# Patient Record
Sex: Female | Born: 1950 | Race: White | Hispanic: No | Marital: Married | Smoking: Never smoker
Health system: Southern US, Community
[De-identification: ages and names within clinical notes are randomized; demographics above are authoritative.]

## PROBLEM LIST (undated history)

## (undated) DIAGNOSIS — E785 Hyperlipidemia, unspecified: Secondary | ICD-10-CM

## (undated) DIAGNOSIS — C801 Malignant (primary) neoplasm, unspecified: Secondary | ICD-10-CM

## (undated) DIAGNOSIS — T7840XA Allergy, unspecified, initial encounter: Secondary | ICD-10-CM

## (undated) DIAGNOSIS — M1990 Unspecified osteoarthritis, unspecified site: Secondary | ICD-10-CM

## (undated) DIAGNOSIS — H269 Unspecified cataract: Secondary | ICD-10-CM

## (undated) DIAGNOSIS — E119 Type 2 diabetes mellitus without complications: Secondary | ICD-10-CM

## (undated) HISTORY — DX: Unspecified osteoarthritis, unspecified site: M19.90

## (undated) HISTORY — DX: Type 2 diabetes mellitus without complications: E11.9

## (undated) HISTORY — PX: ABDOMINAL HYSTERECTOMY: SHX81

## (undated) HISTORY — DX: Unspecified cataract: H26.9

## (undated) HISTORY — PX: EYE SURGERY: SHX253

## (undated) HISTORY — PX: CATARACT EXTRACTION: SUR2

## (undated) HISTORY — DX: Malignant (primary) neoplasm, unspecified: C80.1

## (undated) HISTORY — DX: Hyperlipidemia, unspecified: E78.5

## (undated) HISTORY — DX: Allergy, unspecified, initial encounter: T78.40XA

## (undated) HISTORY — PX: COSMETIC SURGERY: SHX468

## (undated) HISTORY — PX: TUBAL LIGATION: SHX77

---
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
## 1987-01-31 HISTORY — PX: INCONTINENCE SURGERY: SHX676

## 1987-01-31 HISTORY — PX: CYSTOCELE REPAIR: SHX163

## 1991-01-31 HISTORY — PX: HERNIA REPAIR: SHX51

## 1994-01-30 HISTORY — PX: APPENDECTOMY: SHX54

## 2004-03-05 ENCOUNTER — Ambulatory Visit: Payer: Self-pay | Admitting: Internal Medicine

## 2004-03-07 ENCOUNTER — Ambulatory Visit: Payer: Self-pay | Admitting: Internal Medicine

## 2004-04-08 ENCOUNTER — Ambulatory Visit: Payer: Self-pay | Admitting: Internal Medicine

## 2006-03-08 ENCOUNTER — Ambulatory Visit: Payer: Self-pay | Admitting: Cardiology

## 2006-05-07 DIAGNOSIS — E7800 Pure hypercholesterolemia, unspecified: Secondary | ICD-10-CM | POA: Insufficient documentation

## 2006-05-07 DIAGNOSIS — J301 Allergic rhinitis due to pollen: Secondary | ICD-10-CM | POA: Insufficient documentation

## 2006-05-07 DIAGNOSIS — E1169 Type 2 diabetes mellitus with other specified complication: Secondary | ICD-10-CM | POA: Insufficient documentation

## 2006-09-10 ENCOUNTER — Ambulatory Visit: Payer: Self-pay | Admitting: Gastroenterology

## 2006-09-10 LAB — HM COLONOSCOPY: HM Colonoscopy: 5

## 2007-05-01 HISTORY — PX: BUNIONECTOMY: SHX129

## 2009-01-06 ENCOUNTER — Ambulatory Visit: Payer: Self-pay | Admitting: Unknown Physician Specialty

## 2009-02-08 HISTORY — PX: TOTAL THYROIDECTOMY: SHX2547

## 2009-03-18 DIAGNOSIS — Z85850 Personal history of malignant neoplasm of thyroid: Secondary | ICD-10-CM | POA: Insufficient documentation

## 2013-04-07 DIAGNOSIS — R221 Localized swelling, mass and lump, neck: Secondary | ICD-10-CM | POA: Insufficient documentation

## 2013-04-07 DIAGNOSIS — R490 Dysphonia: Secondary | ICD-10-CM | POA: Insufficient documentation

## 2013-08-12 DIAGNOSIS — I8390 Asymptomatic varicose veins of unspecified lower extremity: Secondary | ICD-10-CM | POA: Insufficient documentation

## 2014-01-19 LAB — HM MAMMOGRAPHY

## 2014-02-23 LAB — CBC AND DIFFERENTIAL
HEMATOCRIT: 43 % (ref 36–46)
Hemoglobin: 14.8 g/dL (ref 12.0–16.0)
NEUTROS ABS: 5 /uL
Platelets: 296 10*3/uL (ref 150–399)
WBC: 7.8 10^3/mL

## 2014-02-23 LAB — TSH: TSH: 0.51 u[IU]/mL (ref <=5.90)

## 2014-02-23 LAB — LIPID PANEL: LDl/HDL Ratio: 2.9

## 2014-03-25 LAB — HEPATIC FUNCTION PANEL
ALK PHOS: 72 U/L (ref 25–125)
ALT: 24 U/L (ref 7–35)
AST: 23 U/L (ref 13–35)
Bilirubin, Total: 0.3 mg/dL

## 2014-05-20 LAB — LIPID PANEL
Cholesterol: 258 mg/dL — AB (ref 0–200)
HDL: 53 mg/dL (ref 35–70)
LDL Cholesterol: 184 mg/dL
Triglycerides: 105 mg/dL (ref 40–160)

## 2014-05-20 LAB — BASIC METABOLIC PANEL
BUN: 16 mg/dL (ref 4–21)
CREATININE: 0.7 mg/dL (ref 0.5–1.1)
Glucose: 107 mg/dL
Potassium: 5.5 mmol/L — AB (ref 3.4–5.3)
Sodium: 139 mmol/L (ref 137–147)

## 2014-05-20 LAB — HEMOGLOBIN A1C: HEMOGLOBIN A1C: 6.2 % — AB (ref 4.0–6.0)

## 2014-05-29 ENCOUNTER — Ambulatory Visit
Admit: 2014-05-29 | Disposition: A | Discharge status: Home / Self Care | Payer: Self-pay | Attending: Orthopedic Surgery | Admitting: Orthopedic Surgery

## 2014-06-11 DIAGNOSIS — R739 Hyperglycemia, unspecified: Secondary | ICD-10-CM | POA: Insufficient documentation

## 2014-06-11 DIAGNOSIS — J321 Chronic frontal sinusitis: Secondary | ICD-10-CM | POA: Insufficient documentation

## 2014-06-11 DIAGNOSIS — E559 Vitamin D deficiency, unspecified: Secondary | ICD-10-CM | POA: Insufficient documentation

## 2014-06-11 DIAGNOSIS — E039 Hypothyroidism, unspecified: Secondary | ICD-10-CM | POA: Insufficient documentation

## 2014-08-06 ENCOUNTER — Ambulatory Visit (INDEPENDENT_AMBULATORY_CARE_PROVIDER_SITE_OTHER): Payer: BC Managed Care – PPO | Admitting: Family Medicine

## 2014-08-06 ENCOUNTER — Encounter: Payer: Self-pay | Admitting: Family Medicine

## 2014-08-06 VITALS — BP 112/80 | HR 64 | Temp 97.8°F | Resp 16 | Ht 66.5 in | Wt 227.0 lb

## 2014-08-06 DIAGNOSIS — R221 Localized swelling, mass and lump, neck: Secondary | ICD-10-CM

## 2014-08-06 DIAGNOSIS — R739 Hyperglycemia, unspecified: Secondary | ICD-10-CM

## 2014-08-06 DIAGNOSIS — E7800 Pure hypercholesterolemia, unspecified: Secondary | ICD-10-CM

## 2014-08-06 DIAGNOSIS — E559 Vitamin D deficiency, unspecified: Secondary | ICD-10-CM

## 2014-08-06 DIAGNOSIS — E039 Hypothyroidism, unspecified: Secondary | ICD-10-CM | POA: Diagnosis not present

## 2014-08-06 DIAGNOSIS — Z0000 Encounter for general adult medical examination without abnormal findings: Secondary | ICD-10-CM | POA: Diagnosis not present

## 2014-08-06 NOTE — Progress Notes (Deleted)
Patient ID: Erica Pineda, female   DOB: [birth year], 64 y.o.   MRN: [id number]        Patient: Erica Pineda, Female    DOB: 28-Mar-1950, 64 y.o.   MRN: [id number] Visit Date: 08/06/2014  Today's Provider: Margarita Rana, MD   Chief Complaint  Patient presents with  . Annual Exam   Subjective:    Annual physical exam Erica Pineda is a 64 y.o. female who presents today for health maintenance and complete physical. She feels {DESC; WELL/FAIRLY WELL/POORLY:18703}. She reports exercising ***. She reports she is sleeping {DESC; WELL/FAIRLY WELL/POORLY:18703}.  -----------------------------------------------------------------   Review of Systems  Social History She  reports that she has never smoked. She does not have any smokeless tobacco history on file. She reports that she drinks alcohol. She reports that she does not use illicit drugs.  Patient Active Problem List   Diagnosis Date Noted  . Chronic frontal sinusitis 06/11/2014  . Blood glucose elevated 06/11/2014  . Adult hypothyroidism 06/11/2014  . Avitaminosis D 06/11/2014  . Cancer of thyroid 03/18/2009  . Hay fever 05/07/2006  . Hypercholesteremia 05/07/2006    Past Surgical History  Procedure Laterality Date  . Total thyroidectomy  02/08/2009  . Appendectomy  1996  . Bunionectomy  05/2007    and hammer-toe  . Hernia repair Left 1993  . Cystocele repair  1989  . Tonsillectomy and adenoidectomy  1972  . Hymenectomy  1971  . Abdominal hysterectomy      Family History Her family history includes Alzheimer's disease in her other; Breast cancer in her maternal aunt and sister; Diabetes in her other; Heart disease in her other; Hyperlipidemia in her other; Hypertension in her other.    Previous Medications   CETIRIZINE HCL 10 MG CAPS    Take by mouth.   FLUTICASONE (VERAMYST) 27.5 MCG/SPRAY NASAL SPRAY    Place into the nose.   LEVOTHYROXINE (SYNTHROID, LEVOTHROID) 150 MCG TABLET    Take by mouth.   MULTIPLE VITAMINS-MINERALS PO     Take by mouth.   OMEGA-3 FATTY ACIDS PO    Take by mouth.    Patient Care Team: Margarita Rana, MD as PCP - General (Family Medicine)     Objective:   Vitals: There were no vitals taken for this visit.   Physical Exam   Depression Screen No flowsheet data found.    Assessment & Plan:     Routine Health Maintenance and Physical Exam  Exercise Activities and Dietary recommendations Goals    None      Immunization History  Administered Date(s) Administered  . Tdap 03/31/2010    Health Maintenance  Topic Date Due  . HIV Screening  02/03/1966  . PAP SMEAR  02/03/1969  . ZOSTAVAX  02/04/2011  . INFLUENZA VACCINE  08/31/2014  . MAMMOGRAM  01/20/2016  . COLONOSCOPY  09/09/2016  . TETANUS/TDAP  03/30/2020      Discussed health benefits of physical activity, and encouraged her to engage in regular exercise appropriate for her age and condition.    --------------------------------------------------------------------

## 2014-08-06 NOTE — Progress Notes (Deleted)
Patient ID: Erica Pineda, female   DOB: 1950-02-05, 64 y.o.   MRN: [id number]       Patient: Erica Pineda, Female    DOB: 08-27-1950, 64 y.o.   MRN: [id number] Visit Date: 08/06/2014  Today's Provider: Margarita Rana, MD   Chief Complaint  Patient presents with  . Medicare Wellness   Subjective:    Annual wellness visit Erica Pineda is a 64 y.o. female who presents today for her Subsequent Annual Wellness Visit. She feels {DESC; WELL/FAIRLY WELL/POORLY:18703}. She reports exercising ***. She reports she is sleeping {DESC; WELL/FAIRLY WELL/POORLY:18703}.  Last mammogram: 01/19/2014 WNL Last Colonoscopy; 08/12/2010 S/P Hysterectomy -----------------------------------------------------------   Review of Systems  History   Social History  . Marital Status: Married    Spouse Name: N/A  . Number of Children: N/A  . Years of Education: N/A   Occupational History  . Not on file.   Social History Main Topics  . Smoking status: Never Smoker   . Smokeless tobacco: Not on file  . Alcohol Use: Yes     Comment: occasional  . Drug Use: No  . Sexual Activity: Not on file   Other Topics Concern  . Not on file   Social History Narrative  . No narrative on file    Patient Active Problem List   Diagnosis Date Noted  . Chronic frontal sinusitis 06/11/2014  . Blood glucose elevated 06/11/2014  . Adult hypothyroidism 06/11/2014  . Avitaminosis D 06/11/2014  . 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 Dysphonia 04/07/2013  . Lump in neck 04/07/2013  . Cancer of thyroid 03/18/2009  . Hay fever 05/07/2006  . Hypercholesteremia 05/07/2006    Past Surgical History  Procedure Laterality Date  . Total thyroidectomy  02/08/2009  . Appendectomy  1996  . Bunionectomy  05/2007    and hammer-toe  . Hernia repair Left 1993  . Cystocele repair  1989  . Tonsillectomy and adenoidectomy  1972  . Hymenectomy  1971  . Abdominal hysterectomy      Her family history includes Alzheimer's disease in her other;  Breast cancer in her maternal aunt and sister; Diabetes in her other; Heart disease in her other; Hyperlipidemia in her other; Hypertension in her other.    Previous Medications   CETIRIZINE HCL 10 MG CAPS    Take by mouth.   FLUTICASONE (VERAMYST) 27.5 MCG/SPRAY NASAL SPRAY    Place into the nose.   HYDROXYZINE (ATARAX/VISTARIL) 25 MG TABLET       LEVOTHYROXINE (SYNTHROID, LEVOTHROID) 150 MCG TABLET    Take by mouth.   MULTIPLE VITAMINS-MINERALS PO    Take by mouth.   OMEGA-3 FATTY ACIDS PO    Take by mouth.   TRIAMCINOLONE CREAM (KENALOG) 0.1 %       VITAMIN D, ERGOCALCIFEROL, (DRISDOL) 50000 UNITS CAPS CAPSULE        Patient Care Team: Margarita Rana, MD as PCP - General (Family Medicine)     Objective:   Vitals: There were no vitals taken for this visit.  Physical Exam  Activities of Daily Living No flowsheet data found.  Fall Risk Assessment No flowsheet data found.   Depression Screen No flowsheet data found.  Cognitive Testing - 6-CIT  Correct? Score   What year is it? {yes no:22349} {0-4:31231} 0 or 4  What month is it? {yes no:22349} {0-3:21082} 0 or 3  Memorize:    Erica Pineda,  [address]  [place],      What time is it? (  within 1 hour) {yes no:22349} {0-3:21082} 0 or 3  Count backwards from 20 {yes no:22349} {0-4:31231} 0, 2, or 4  Name the months of the year {yes no:22349} {0-4:31231} 0, 2, or 4  Repeat name & address above {yes no:22349} {0-10:5044} 0, 2, 4, 6, 8, or 10       TOTAL SCORE  ***/28   Interpretation:  {normal/abnormal:11317::"Normal"}  Normal (0-7) Abnormal (8-28)       Assessment & Plan:     Annual Wellness Visit  Reviewed patient's Family Medical History Reviewed and updated list of patient's medical providers Assessment of cognitive impairment was done Assessed patient's functional ability Established a written schedule for health screening Erica Pineda Completed and Reviewed  Exercise Activities and  Dietary recommendations Goals    None      Immunization History  Administered Date(s) Administered  . Tdap 03/31/2010    Health Maintenance  Topic Date Due  . HIV Screening  02/03/1966  . PAP SMEAR  02/03/1969  . ZOSTAVAX  02/04/2011  . INFLUENZA VACCINE  08/31/2014  . MAMMOGRAM  01/20/2016  . COLONOSCOPY  09/09/2016  . TETANUS/TDAP  03/30/2020      Discussed health benefits of physical activity, and encouraged her to engage in regular exercise appropriate for her age and condition.    ------------------------------------------------------------------------------------------------------------

## 2014-08-06 NOTE — Progress Notes (Signed)
Patient ID: Erica Pineda, female   DOB: 1950-09-28, 64 y.o.   MRN: [id number]       Patient: Erica Pineda, Female    DOB: 02/15/1950, 64 y.o.   MRN: [id number] Visit Date: 08/07/2014  Today's Provider: Margarita Rana, MD   Chief Complaint  Patient presents with  . Annual Exam   Subjective:    Annual physical exam Erica Pineda is a 64 y.o. female who presents today for health maintenance and complete physical. She feels well. She reports exercising daily. Pt reports she has been walking more, anywhere from 10,000 to 20,000 steps. Pt has lost close to 30 pounds. She reports she is sleeping well.  Last mammogram: 01/19/2014 WNL Last colonoscopy: 08/12/2010 S/P Hysterectomy   Review of Systems  Genitourinary:       Incontinence is present  All other systems reviewed and are negative.   Social History She  reports that she has never smoked. She has never used smokeless tobacco. She reports that she drinks alcohol. She reports that she does not use illicit drugs.  Patient Active Problem List   Diagnosis Date Noted  . Chronic frontal sinusitis 06/11/2014  . Blood glucose elevated 06/11/2014  . Adult hypothyroidism 06/11/2014  . Avitaminosis D 06/11/2014  . 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 Dysphonia 04/07/2013  . Lump in neck 04/07/2013  . Cancer of thyroid 03/18/2009  . Hay fever 05/07/2006  . Hypercholesteremia 05/07/2006    Past Surgical History  Procedure Laterality Date  . Total thyroidectomy  02/08/2009  . Appendectomy  1996  . Bunionectomy  05/2007    and hammer-toe  . Hernia repair Left 1993  . Cystocele repair  1989  . Tonsillectomy and adenoidectomy  1972  . Hymenectomy  1971  . Abdominal hysterectomy      Family History Her family history includes Alzheimer's disease in her father and other; Breast cancer in her maternal aunt and sister; Diabetes in her father, other, sister, and sister; Heart disease in her other; Hyperlipidemia in her other; Hypertension in her other;  Osteoporosis in her mother; Stroke in her mother.    Previous Medications   CETIRIZINE HCL 10 MG CAPS    Take by mouth.   FLUTICASONE (VERAMYST) 27.5 MCG/SPRAY NASAL SPRAY    Place into the nose.   HYDROXYZINE (ATARAX/VISTARIL) 25 MG TABLET       LEVOTHYROXINE (SYNTHROID, LEVOTHROID) 150 MCG TABLET    Take by mouth.   MULTIPLE VITAMINS-MINERALS PO    Take by mouth.   OMEGA-3 FATTY ACIDS PO    Take by mouth.   VITAMIN D, ERGOCALCIFEROL, (DRISDOL) 50000 UNITS CAPS CAPSULE        Patient Care Team: Margarita Rana, MD as PCP - General (Family Medicine)     Objective:   Vitals: BP 112/80 mmHg  Pulse 64  Temp(Src) 97.8 F (36.6 C) (Oral)  Resp 16  Ht 5' 6.5" (1.689 m)  Wt 227 lb (102.967 kg)  BMI 36.09 kg/m2   Physical Exam  Constitutional: She is oriented to person, place, and time. She appears well-developed and well-nourished.  HENT:  Head: Normocephalic and atraumatic.  Right Ear: External ear normal.  Left Ear: External ear normal.  Nose: Nose normal.  Mouth/Throat: Oropharynx is clear and moist.  Eyes: Conjunctivae and EOM are normal. Pupils are equal, round, and reactive to light. Right eye exhibits no discharge. Left eye exhibits no discharge.  Neck: Normal range of motion. Neck supple. No tracheal deviation present. No thyromegaly present.  Palpable  fullness on back of right neck  Cardiovascular: Normal rate, regular rhythm, normal heart sounds and intact distal pulses.  Exam reveals no gallop and no friction rub.   No murmur heard. Pulmonary/Chest: Effort normal and breath sounds normal. No respiratory distress. She has no wheezes. She has no rales. She exhibits no tenderness.  Abdominal: Soft. Bowel sounds are normal. She exhibits no distension and no mass. There is no tenderness. There is no rebound and no guarding.  Genitourinary: No breast swelling, tenderness, discharge or bleeding.  Musculoskeletal: Normal range of motion. She exhibits no edema or tenderness.    Neurological: She is alert and oriented to person, place, and time. She has normal reflexes.  Skin: Skin is warm and dry. No rash noted. No erythema. No pallor.  Psychiatric: She has a normal mood and affect. Her behavior is normal. Judgment and thought content normal.     Depression Screen PHQ 2/9 Scores 08/06/2014  PHQ - 2 Score 0      Assessment & Plan:     Routine Health Maintenance and Physical Exam  Exercise Activities and Dietary recommendations Goals    . Reduce portion size     Continue to eat healthy. Continue Weight [employer].         Immunization History  Administered Date(s) Administered  . Tdap 03/31/2010    Health Maintenance  Topic Date Due  . HIV Screening  02/03/1966  . PAP SMEAR  02/03/1969  . ZOSTAVAX  02/04/2011  . INFLUENZA VACCINE  08/31/2014  . MAMMOGRAM  01/20/2016  . COLONOSCOPY  09/09/2016  . TETANUS/TDAP  03/30/2020      Discussed health benefits of physical activity, and encouraged her to engage in regular exercise appropriate for her age and condition.    --------------------------------------------------------------------   2. Hypothyroidism, unspecified hypothyroidism type F/B endocrinology; pt requesting level be checked. - TSH  3. Blood glucose elevated Pt has a h/o this, but has lost 30 pounds. Will FU pending results. - Hemoglobin A1c  4. Hypercholesteremia Pt has h/o this , FU pending report. - Lipid panel - Comprehensive metabolic panel - CBC with Differential/Platelet  5. Lump in neck Stable. Not worsening.  6. Avitaminosis D F/B endo. - Vit D  25 hydroxy (rtn osteoporosis monitoring)   Patient seen and examined by Jerrell Belfast, MD, and note scribed by Renaldo Fiddler, CMA. I have reviewed the document for accuracy and completeness and I agree with above. Jerrell Belfast, MD    Margarita Rana, MD

## 2014-08-13 ENCOUNTER — Encounter: Payer: Self-pay | Admitting: Family Medicine

## 2014-08-14 ENCOUNTER — Other Ambulatory Visit: Payer: Self-pay

## 2014-08-14 DIAGNOSIS — E039 Hypothyroidism, unspecified: Secondary | ICD-10-CM

## 2014-08-21 LAB — CBC WITH DIFFERENTIAL/PLATELET
BASOS: 1 %
Basophils Absolute: 0 10*3/uL (ref 0.0–0.2)
EOS (ABSOLUTE): 0.2 10*3/uL (ref 0.0–0.4)
Eos: 4 %
HEMATOCRIT: 42.3 % (ref 34.0–46.6)
Hemoglobin: 14.4 g/dL (ref 11.1–15.9)
IMMATURE GRANS (ABS): 0 10*3/uL (ref 0.0–0.1)
IMMATURE GRANULOCYTES: 0 %
Lymphocytes Absolute: 1.7 10*3/uL (ref 0.7–3.1)
Lymphs: 29 %
MCH: 29.3 pg (ref 26.6–33.0)
MCHC: 34 g/dL (ref 31.5–35.7)
MCV: 86 fL (ref 79–97)
MONOS ABS: 0.4 10*3/uL (ref 0.1–0.9)
Monocytes: 7 %
NEUTROS ABS: 3.5 10*3/uL (ref 1.4–7.0)
NEUTROS PCT: 59 %
Platelets: 314 10*3/uL (ref 150–379)
RBC: 4.91 x10E6/uL (ref 3.77–5.28)
RDW: 13.7 % (ref 12.3–15.4)
WBC: 5.9 10*3/uL (ref 3.4–10.8)

## 2014-08-21 LAB — HEMOGLOBIN A1C
ESTIMATED AVERAGE GLUCOSE: 126 mg/dL
HEMOGLOBIN A1C: 6 % — AB (ref 4.8–5.6)

## 2014-08-22 LAB — COMPREHENSIVE METABOLIC PANEL
A/G RATIO: 1.8 (ref 1.1–2.5)
ALT: 19 IU/L (ref 0–32)
AST: 17 IU/L (ref 0–40)
Albumin: 4.1 g/dL (ref 3.6–4.8)
Alkaline Phosphatase: 65 IU/L (ref 39–117)
BUN/Creatinine Ratio: 26 (ref 11–26)
BUN: 20 mg/dL (ref 8–27)
Bilirubin Total: 0.5 mg/dL (ref 0.0–1.2)
CO2: 24 mmol/L (ref 18–29)
Calcium: 9.3 mg/dL (ref 8.7–10.3)
Chloride: 102 mmol/L (ref 97–108)
Creatinine, Ser: 0.77 mg/dL (ref 0.57–1.00)
GFR calc Af Amer: 95 mL/min/{1.73_m2} (ref >59)
GFR calc non Af Amer: 82 mL/min/{1.73_m2} (ref >59)
Globulin, Total: 2.3 g/dL (ref 1.5–4.5)
Glucose: 104 mg/dL — ABNORMAL HIGH (ref 65–99)
POTASSIUM: 5 mmol/L (ref 3.5–5.2)
SODIUM: 141 mmol/L (ref 134–144)
Total Protein: 6.4 g/dL (ref 6.0–8.5)

## 2014-08-22 LAB — LIPID PANEL
CHOL/HDL RATIO: 4.7 ratio — AB (ref 0.0–4.4)
Cholesterol, Total: 280 mg/dL — ABNORMAL HIGH (ref 100–199)
HDL: 60 mg/dL (ref >39)
LDL CALC: 197 mg/dL — AB (ref 0–99)
Triglycerides: 116 mg/dL (ref 0–149)
VLDL CHOLESTEROL CAL: 23 mg/dL (ref 5–40)

## 2014-08-22 LAB — VITAMIN D 25 HYDROXY (VIT D DEFICIENCY, FRACTURES): VIT D 25 HYDROXY: 24.2 ng/mL — AB (ref 30.0–100.0)

## 2014-08-22 LAB — TSH: TSH: 0.367 u[IU]/mL — AB (ref 0.450–4.500)

## 2014-08-25 ENCOUNTER — Telehealth: Payer: Self-pay

## 2014-08-25 ENCOUNTER — Telehealth: Payer: Self-pay | Admitting: Family Medicine

## 2014-08-25 DIAGNOSIS — E039 Hypothyroidism, unspecified: Secondary | ICD-10-CM

## 2014-08-25 MED ORDER — LEVOTHYROXINE SODIUM 137 MCG PO TABS: 137.0000 ug | ORAL_TABLET | Freq: Every day | ORAL | Status: DC

## 2014-08-25 NOTE — Telephone Encounter (Signed)
LM about Lab work

## 2014-08-25 NOTE — Telephone Encounter (Signed)
-----   Message from Margarita Rana, MD sent at 08/24/2014  1:55 PM EDT ----- Thyroid overcorrected.  Recommend decrease Levothyroxine to 137 and recheck in 6 weeks. Cholesterol still too high at 280. Vitamin  D borderline. Please see if taking supplement. Blood sugar improved at 6.0. Continue current lifestyle changes.   Thanks.

## 2014-08-25 NOTE — Telephone Encounter (Signed)
Advised pt of lab results. Sent new rx in. Pt is currently taking Vitamin D 50,000 IU every Saturday. Pt verbally acknowledges understanding. Renaldo Fiddler, CMA

## 2015-01-19 ENCOUNTER — Other Ambulatory Visit: Payer: Self-pay

## 2015-01-19 DIAGNOSIS — E559 Vitamin D deficiency, unspecified: Secondary | ICD-10-CM

## 2015-01-19 MED ORDER — VITAMIN D (ERGOCALCIFEROL) 1.25 MG (50000 UNIT) PO CAPS: 50000.0000 [IU] | ORAL_CAPSULE | ORAL | Status: DC

## 2015-03-13 ENCOUNTER — Other Ambulatory Visit: Payer: Self-pay | Admitting: Family Medicine

## 2015-03-13 DIAGNOSIS — E039 Hypothyroidism, unspecified: Secondary | ICD-10-CM

## 2015-04-02 ENCOUNTER — Encounter: Payer: Self-pay | Admitting: Family Medicine

## 2015-04-14 ENCOUNTER — Other Ambulatory Visit: Payer: Self-pay | Admitting: Family Medicine

## 2015-04-14 DIAGNOSIS — E559 Vitamin D deficiency, unspecified: Secondary | ICD-10-CM

## 2015-06-16 ENCOUNTER — Other Ambulatory Visit: Payer: Self-pay | Admitting: Family Medicine

## 2015-06-16 DIAGNOSIS — E039 Hypothyroidism, unspecified: Secondary | ICD-10-CM

## 2015-06-30 ENCOUNTER — Ambulatory Visit (INDEPENDENT_AMBULATORY_CARE_PROVIDER_SITE_OTHER): Payer: BC Managed Care – PPO | Admitting: Family Medicine

## 2015-06-30 ENCOUNTER — Encounter: Payer: Self-pay | Admitting: Family Medicine

## 2015-06-30 VITALS — BP 134/80 | HR 74 | Temp 99.1°F | Resp 16 | Wt 242.8 lb

## 2015-06-30 DIAGNOSIS — E7800 Pure hypercholesterolemia, unspecified: Secondary | ICD-10-CM | POA: Diagnosis not present

## 2015-06-30 DIAGNOSIS — R221 Localized swelling, mass and lump, neck: Secondary | ICD-10-CM

## 2015-06-30 DIAGNOSIS — E039 Hypothyroidism, unspecified: Secondary | ICD-10-CM | POA: Diagnosis not present

## 2015-06-30 DIAGNOSIS — R739 Hyperglycemia, unspecified: Secondary | ICD-10-CM | POA: Diagnosis not present

## 2015-06-30 DIAGNOSIS — L259 Unspecified contact dermatitis, unspecified cause: Secondary | ICD-10-CM | POA: Insufficient documentation

## 2015-06-30 DIAGNOSIS — E559 Vitamin D deficiency, unspecified: Secondary | ICD-10-CM | POA: Diagnosis not present

## 2015-06-30 DIAGNOSIS — D179 Benign lipomatous neoplasm, unspecified: Secondary | ICD-10-CM | POA: Diagnosis not present

## 2015-06-30 MED ORDER — TRIAMCINOLONE ACETONIDE 0.1 % EX CREA: 1.0000 "application " | TOPICAL_CREAM | Freq: Two times a day (BID) | CUTANEOUS | Status: DC

## 2015-06-30 NOTE — Progress Notes (Signed)
Patient: Erica Pineda Female    DOB: 10/08/1950   65 y.o.   MRN: [id number] Visit Date: 06/30/2015  Today's Provider: Margarita Rana, MD   Chief Complaint  Patient presents with  . Skin Problem   Subjective:    HPI Patient comes in office today to address changes to her skin. Patient reports that in March 2015 she was seen by a surgeon at [facility] with concerns of lump/bump under her skin that appeared on the back of her neck and upper back. Patient reports that on 04/14/13 CAT scan was performed on two sites in question and results had  came back as  benign finding. Patient states in the past 3-4 months she has noticed that areas have grown in size.  Saw her thyroid surgeon at that time.  Are enlarged.   Also complaining of intermittent rash and needs medication refilled.    Also, taking her thyroid medication without any difficulty   Has not had her labs checked. No symptoms of hypothyroid.  Will check labs.      Allergies  Allergen Reactions  . Percodan  [Oxycodone-Aspirin]   . Simvastatin Swelling    Other reaction(s): Muscle Pain   Previous Medications   CETIRIZINE HCL 10 MG CAPS    Take by mouth. Reported on 06/30/2015   FLUTICASONE (VERAMYST) 27.5 MCG/SPRAY NASAL SPRAY    Place into the nose. Reported on 06/30/2015   HYDROXYZINE (ATARAX/VISTARIL) 25 MG TABLET    Reported on 06/30/2015   LEVOTHYROXINE (SYNTHROID, LEVOTHROID) 137 MCG TABLET    TAKE 1 TABLET (137 MCG TOTAL) BY MOUTH DAILY BEFORE BREAKFAST.   MULTIPLE VITAMINS-MINERALS PO    Take by mouth.   OMEGA-3 FATTY ACIDS PO    Take by mouth.   VITAMIN D, ERGOCALCIFEROL, (DRISDOL) 50000 UNITS CAPS CAPSULE    TAKE 1 CAPSULE (50,000 UNITS TOTAL) BY MOUTH EVERY 7 (SEVEN) DAYS.    Review of Systems  HENT: Positive for sore throat (patient describes as scratchy). Negative for congestion, dental problem, drooling, ear discharge, ear pain, facial swelling, hearing loss, mouth sores, nosebleeds, postnasal drip, rhinorrhea,  sinus pressure, sneezing, tinnitus, trouble swallowing and voice change.   Eyes: Negative.   Respiratory: Negative.   Cardiovascular: Negative.   Gastrointestinal: Negative.   Endocrine: Negative.   Genitourinary: Negative.   Musculoskeletal: Positive for neck pain (patient reports that neck has been sore since Saturday ). Negative for myalgias, back pain, joint swelling, arthralgias, gait problem and neck stiffness.  Skin: Negative.   Allergic/Immunologic: Negative.   Neurological: Negative.   Hematological: Negative.   Psychiatric/Behavioral: Negative.     Social History  Substance Use Topics  . Smoking status: Never Smoker   . Smokeless tobacco: Never Used  . Alcohol Use: Yes     Comment: occasional   Objective:   BP 134/80 mmHg  Pulse 74  Temp(Src) 99.1 F (37.3 C) (Oral)  Resp 16  Wt 242 lb 12.8 oz (110.133 kg) Physical Exam  Constitutional: She is oriented to person, place, and time. She appears well-developed and well-nourished.  Cardiovascular: Normal rate and regular rhythm.   Pulmonary/Chest: Effort normal and breath sounds normal.  Neurological: She is alert and oriented to person, place, and time.  Skin: Skin is warm and dry.  Palpable nodule in right neck and left back.   Soft. Non-tender.     Psychiatric: She has a normal mood and affect. Her behavior is normal. Judgment and thought content normal.  Assessment & Plan:     1. Lipoma Suspect lesion are lipoma.     2. Lump in neck Lump has grown. Suspect lipoma but patient does have history of cancer. Will refer for further evaluation and treatment.   - Ambulatory referral to General Surgery  3. Hypothyroidism, unspecified hypothyroidism type Will check labs. Continue medication.   - TSH  4. Hypercholesteremia Will check labs.   - Comprehensive Metabolic Panel (CMET) - CBC with Differential/Platelet - Lipid panel  5. Blood glucose elevated Will check labs.   - Hemoglobin A1c  6.  Avitaminosis D Will check labs.   - VITAMIN D 25 Hydroxy (Vit-D Deficiency, Fractures)  7. Contact dermatitis Stable. Will refill medication.    - triamcinolone cream (KENALOG) 0.1 %; Apply 1 application topically 2 (two) times daily.  Dispense: 80 g; Refill: 1     Patient seen and examined by Dr. Jerrell Belfast, and note scribed by [employer], [place]. I have reviewed the document for accuracy and completeness and I agree with above. Jerrell Belfast, MD   Margarita Rana, MD

## 2015-08-09 ENCOUNTER — Encounter: Payer: Self-pay | Admitting: Physician Assistant

## 2015-08-09 ENCOUNTER — Ambulatory Visit (INDEPENDENT_AMBULATORY_CARE_PROVIDER_SITE_OTHER): Payer: BC Managed Care – PPO | Admitting: Physician Assistant

## 2015-08-09 VITALS — BP 130/80 | HR 76 | Temp 98.0°F | Resp 16 | Ht 67.0 in | Wt 240.4 lb

## 2015-08-09 DIAGNOSIS — M25562 Pain in left knee: Secondary | ICD-10-CM | POA: Diagnosis not present

## 2015-08-09 DIAGNOSIS — E890 Postprocedural hypothyroidism: Secondary | ICD-10-CM

## 2015-08-09 DIAGNOSIS — E559 Vitamin D deficiency, unspecified: Secondary | ICD-10-CM | POA: Diagnosis not present

## 2015-08-09 DIAGNOSIS — Z1239 Encounter for other screening for malignant neoplasm of breast: Secondary | ICD-10-CM | POA: Diagnosis not present

## 2015-08-09 DIAGNOSIS — D179 Benign lipomatous neoplasm, unspecified: Secondary | ICD-10-CM

## 2015-08-09 DIAGNOSIS — Z0000 Encounter for general adult medical examination without abnormal findings: Secondary | ICD-10-CM | POA: Diagnosis not present

## 2015-08-09 DIAGNOSIS — E7800 Pure hypercholesterolemia, unspecified: Secondary | ICD-10-CM

## 2015-08-09 NOTE — Patient Instructions (Signed)

## 2015-08-09 NOTE — Progress Notes (Signed)
Patient: Erica Pineda, Female    DOB: 11-28-1950, 65 y.o.   MRN: [id number] Visit Date: 08/09/2015  Today's Provider: Mar Daring, PA-C   No chief complaint on file.  Subjective:    Annual physical exam Erica Pineda is a 65 y.o. female who presents today for health maintenance and complete physical. She feels fairly well. She reports that her left knee hurts. She has been seen by a chiropractor for this as well as Dr. Noemi Chapel. Also she is having neck stiffness and is not sure if it is coming from her lipoma. She also reports that the Lipomas are bigger in size and have been growing more rapidly this year. She is seeing Dr. Tamala Julian at [facility] next week for evaluation of these and possible removal. She reports exercising she walk again for the first time in months and the the pain in her knee flared up. She reports she is sleeping well.   Mammogram:Benign 01/26/15 -There is no mammographic evidence of malignancy. Recommended repeat in 1 year. Eye Exam: 12/2014 Pap: hysterectomy -----------------------------------------------------------------   Review of Systems  Constitutional: Negative.   HENT: Negative.   Eyes: Positive for itching (seasonal allergies).  Respiratory: Negative.   Cardiovascular: Negative.   Gastrointestinal: Negative.   Endocrine: Negative.   Genitourinary: Negative.   Musculoskeletal: Positive for joint swelling (left knee), arthralgias (left knee) and neck stiffness (since the first week of June). Negative for myalgias, back pain, gait problem and neck pain.  Skin: Negative.   Allergic/Immunologic: Positive for environmental allergies.  Neurological: Negative.   Hematological: Negative.   Psychiatric/Behavioral: Negative.     Social History      She  reports that she has never smoked. She has never used smokeless tobacco. She reports that she drinks alcohol. She reports that she does not use illicit drugs.       Social History   Social History  .  Marital Status: Married    Spouse Name: Annie Main  . Number of Children: 3  . Years of Education: 63   Occupational History  . 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 Smoking status: Never Smoker   . Smokeless tobacco: Never Used  . Alcohol Use: Yes     Comment: occasional  . Drug Use: No  . Sexual Activity: Not on file   Other Topics Concern  . Not on file   Social History Narrative    No past medical history on file.   Patient Active Problem List   Diagnosis Date Noted  . Lipoma 06/30/2015  . Contact dermatitis 06/30/2015  . Chronic frontal sinusitis 06/11/2014  . Blood glucose elevated 06/11/2014  . Adult hypothyroidism 06/11/2014  . Avitaminosis D 06/11/2014  . 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 Dysphonia 04/07/2013  . Lump in neck 04/07/2013  . Cancer of thyroid ([place]) 03/18/2009  . Hay fever 05/07/2006  . Hypercholesteremia 05/07/2006    Past Surgical History  Procedure Laterality Date  . Total thyroidectomy  02/08/2009  . Appendectomy  1996  . Bunionectomy  05/2007    and hammer-toe  . Hernia repair Left 1993  . Cystocele repair  1989  . Tonsillectomy and adenoidectomy  1972  . Hymenectomy  1971  . Abdominal hysterectomy      Family History        Family Status  Relation Status Death Age  . Sister Alive   . Mother Alive   . Father Deceased   .  Brother Alive   . Sister Alive         Her family history includes Alzheimer's disease in her father and other; Breast cancer in her maternal aunt and sister; Diabetes in her father, other, sister, and sister; Heart disease in her other; Hyperlipidemia in her other; Hypertension in her other; Osteoporosis in her mother; Stroke in her mother.    Allergies  Allergen Reactions  . Percodan  [Oxycodone-Aspirin]   . Simvastatin Swelling    Other reaction(s): Muscle Pain    No outpatient prescriptions have been marked as taking for the 08/09/15 encounter (Appointment) with Mar Daring, PA-C.    Patient Care Team: Margarita Rana, MD as PCP - General (Family Medicine)     Objective:   Vitals: There were no vitals taken for this visit.   Physical Exam  Constitutional: She is oriented to person, place, and time. She appears well-developed and well-nourished. No distress.  HENT:  Head: Normocephalic and atraumatic.  Right Ear: External ear normal.  Left Ear: External ear normal.  Nose: Nose normal.  Mouth/Throat: Oropharynx is clear and moist. No oropharyngeal exudate.  Eyes: Conjunctivae and EOM are normal. Pupils are equal, round, and reactive to light. Right eye exhibits no discharge. Left eye exhibits no discharge. No scleral icterus.  Neck: Normal range of motion. Neck supple. No JVD present. No tracheal deviation present. No thyromegaly present.  Cardiovascular: Normal rate, regular rhythm, normal heart sounds and intact distal pulses.  Exam reveals no gallop and no friction rub.   No murmur heard. Pulmonary/Chest: Effort normal and breath sounds normal. No respiratory distress. She has no wheezes. She has no rales. She exhibits no tenderness. Right breast exhibits no inverted nipple, no mass, no nipple discharge, no skin change and no tenderness. Left breast exhibits no inverted nipple, no mass, no nipple discharge, no skin change and no tenderness. Breasts are symmetrical.  Abdominal: Soft. Bowel sounds are normal. She exhibits no distension and no mass. There is no tenderness. There is no rebound and no guarding.  Musculoskeletal: Normal range of motion. She exhibits no edema or tenderness.  Lymphadenopathy:    She has no cervical adenopathy.  Neurological: She is alert and oriented to person, place, and time.  Skin: Skin is warm and dry. No rash noted. She is not diaphoretic.  Psychiatric: She has a normal mood and affect. Her behavior is normal. Judgment and thought content normal.  Vitals reviewed.   Depression Screen PHQ 2/9 Scores 08/06/2014  PHQ  - 2 Score 0    Assessment & Plan:     Routine Health Maintenance and Physical Exam  Exercise Activities and Dietary recommendations Goals    . Reduce portion size     Continue to eat healthy. Continue Weight Watchers.         Immunization History  Administered Date(s) Administered  . Tdap 03/31/2010    Health Maintenance  Topic Date Due  . Hepatitis C Screening  November 11, 1950  . HIV Screening  02/03/1966  . PAP SMEAR  02/04/1972  . ZOSTAVAX  02/04/2011  . INFLUENZA VACCINE  08/31/2015  . COLONOSCOPY  09/09/2016  . MAMMOGRAM  01/25/2017  . TETANUS/TDAP  03/30/2020      Discussed health benefits of physical activity, and encouraged her to engage in regular exercise appropriate for her age and condition.   1. Annual physical exam Normal physical exam today. Will check labs as below and f/u pending lab results. If labs are stable and WNL she  will not need to have these rechecked for one year at her next annual physical exam. She is to call the office in the meantime if she has any acute issue, questions or concerns. - CBC with Differential/Platelet - Comprehensive metabolic panel - Hemoglobin A1c  2. Breast cancer screening Mammogram due in 12/2015. Breast exam today was normal.  3. Lipoma Will see Dr. Tamala Julian next week for further evaluation and consideration of removal. May be causing neck stiffness.  4. Hypothyroidism, unspecified hypothyroidism type Secondary to thyroid carcinoma. Will check labs and f/u pending results. - TSH  5. Hypercholesteremia Will check labs as below and f/u pending results. - Lipid panel  6. Avitaminosis D Will check labs as below and f/u pending results. - Vitamin D (25 hydroxy)  7. Left knee pain Has seen a chiropractor before for neck, back and knee pain. Patient is requesting a referral to Dr. Marry Guan for further evaluation. - Ambulatory referral to Orthopedic Surgery  The entirety of the information documented in the History of  Present Illness, Review of Systems and Physical Exam were personally obtained by me. Portions of this information were initially documented by Lyndel Pleasure, CMA and reviewed by me for thoroughness and accuracy. --------------------------------------------------------------------    Mar Daring, PA-C  [facility]

## 2015-09-03 NOTE — Telephone Encounter (Signed)
error 

## 2015-11-25 DIAGNOSIS — D170 Benign lipomatous neoplasm of skin and subcutaneous tissue of head, face and neck: Secondary | ICD-10-CM | POA: Insufficient documentation

## 2016-02-09 ENCOUNTER — Encounter: Payer: Self-pay | Admitting: Physician Assistant

## 2016-02-09 ENCOUNTER — Ambulatory Visit (INDEPENDENT_AMBULATORY_CARE_PROVIDER_SITE_OTHER): Payer: BC Managed Care – PPO | Admitting: Physician Assistant

## 2016-02-09 DIAGNOSIS — Z1159 Encounter for screening for other viral diseases: Secondary | ICD-10-CM | POA: Diagnosis not present

## 2016-02-09 DIAGNOSIS — R5383 Other fatigue: Secondary | ICD-10-CM

## 2016-02-09 DIAGNOSIS — E7800 Pure hypercholesterolemia, unspecified: Secondary | ICD-10-CM

## 2016-02-09 DIAGNOSIS — R739 Hyperglycemia, unspecified: Secondary | ICD-10-CM

## 2016-02-09 DIAGNOSIS — E039 Hypothyroidism, unspecified: Secondary | ICD-10-CM | POA: Diagnosis not present

## 2016-02-09 DIAGNOSIS — C73 Malignant neoplasm of thyroid gland: Secondary | ICD-10-CM

## 2016-02-09 DIAGNOSIS — Z114 Encounter for screening for human immunodeficiency virus [HIV]: Secondary | ICD-10-CM | POA: Diagnosis not present

## 2016-02-09 DIAGNOSIS — A084 Viral intestinal infection, unspecified: Secondary | ICD-10-CM

## 2016-02-09 DIAGNOSIS — G4762 Sleep related leg cramps: Secondary | ICD-10-CM

## 2016-02-09 DIAGNOSIS — E559 Vitamin D deficiency, unspecified: Secondary | ICD-10-CM

## 2016-02-09 NOTE — Patient Instructions (Signed)
Exercising to Lose Weight Introduction Exercising can help you to lose weight. In order to lose weight through exercise, you need to do vigorous-intensity exercise. You can tell that you are exercising with vigorous intensity if you are breathing very hard and fast and cannot hold a conversation while exercising. Moderate-intensity exercise helps to maintain your current weight. You can tell that you are exercising at a moderate level if you have a higher heart rate and faster breathing, but you are still able to hold a conversation. How often should I exercise? Choose an activity that you enjoy and set realistic goals. Your health care provider can help you to make an activity plan that works for you. Exercise regularly as directed by your health care provider. This may include:  Doing resistance training twice each week, such as:  Push-ups.  Sit-ups.  Lifting weights.  Using resistance bands.  Doing a given intensity of exercise for a given amount of time. Choose from these options:  150 minutes of moderate-intensity exercise every week.  75 minutes of vigorous-intensity exercise every week.  A mix of moderate-intensity and vigorous-intensity exercise every week. Children, pregnant women, people who are out of shape, people who are overweight, and older adults may need to consult a health care provider for individual recommendations. If you have any sort of medical condition, be sure to consult your health care provider before starting a new exercise program. What are some activities that can help me to lose weight?  Walking at a rate of at least 4.5 miles an hour.  Jogging or running at a rate of 5 miles per hour.  Biking at a rate of at least 10 miles per hour.  Lap swimming.  Roller-skating or in-line skating.  Cross-country skiing.  Vigorous competitive sports, such as football, basketball, and soccer.  Jumping rope.  Aerobic dancing. How can I be more active in my  day-to-day activities?  Use the stairs instead of the elevator.  Take a walk during your lunch break.  If you drive, park your car farther away from work or school.  If you take public transportation, get off one stop early and walk the rest of the way.  Make all of your phone calls while standing up and walking around.  Get up, stretch, and walk around every 30 minutes throughout the day. What guidelines should I follow while exercising?  Do not exercise so much that you hurt yourself, feel dizzy, or get very short of breath.  Consult your health care provider prior to starting a new exercise program.  Wear comfortable clothes and shoes with good support.  Drink plenty of water while you exercise to prevent dehydration or heat stroke. Body water is lost during exercise and must be replaced.  Work out until you breathe faster and your heart beats faster. This information is not intended to replace advice given to you by your health care provider. Make sure you discuss any questions you have with your health care provider. Document Released: 02/18/2010 Document Revised: 06/24/2015 Document Reviewed: 06/19/2013  2017 Elsevier  

## 2016-02-09 NOTE — Progress Notes (Signed)
Patient: Erica Pineda Female    DOB: Jun 16, 1950   66 y.o.   MRN: [id number] Visit Date: 02/09/2016  Today's Provider: Mar Daring, PA-C   Chief Complaint  Patient presents with  . Abdominal Pain   Subjective:    HPI  Patient c/o nausea and fatigue since Monday. Patient reports poor appetite and abdominal pain today. Patient reports that her husband had same symptoms this week. She does feel these symptoms are improving.  Patient also concerned about her BP, reports that her BP had been elevated at other doctor's office's. Patient denies chest pain or swelling in legs or ankles. Patient would like to have some labs drawn as she has not had previous labs that had been ordered checked.      Allergies  Allergen Reactions  . Percodan  [Oxycodone-Aspirin]   . Simvastatin Swelling    Other reaction(s): Muscle Pain    Patient Active Problem List   Diagnosis Date Noted  . Lipoma of neck 11/25/2015  . Lipoma 06/30/2015  . Contact dermatitis 06/30/2015  . Chronic frontal sinusitis 06/11/2014  . Blood glucose elevated 06/11/2014  . Adult hypothyroidism 06/11/2014  . Avitaminosis D 06/11/2014  . 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 Dysphonia 04/07/2013  . Lump in neck 04/07/2013  . Cancer of thyroid ([place]) 03/18/2009  . Hay fever 05/07/2006  . Hypercholesteremia 05/07/2006     Current Outpatient Prescriptions:  .  Cetirizine HCl 10 MG CAPS, Take 1 capsule by mouth as needed. Reported on 06/30/2015, Disp: , Rfl:  .  fluticasone (VERAMYST) 27.5 MCG/SPRAY nasal spray, Place 2 sprays into the nose as needed. Reported on 08/09/2015, Disp: , Rfl:  .  levothyroxine (SYNTHROID, LEVOTHROID) 137 MCG tablet, TAKE 1 TABLET (137 MCG TOTAL) BY MOUTH DAILY BEFORE BREAKFAST., Disp: 90 tablet, Rfl: 1 .  MULTIPLE VITAMINS-MINERALS PO, Take by mouth., Disp: , Rfl:  .  naproxen sodium (ANAPROX) 220 MG tablet, Take 440 mg by mouth 2 (two) times daily with a meal., Disp: , Rfl:  .  OMEGA-3 FATTY ACIDS  PO, Take by mouth., Disp: , Rfl:  .  Vitamin D, Ergocalciferol, (DRISDOL) 50000 units CAPS capsule, TAKE 1 CAPSULE (50,000 UNITS TOTAL) BY MOUTH EVERY 7 (SEVEN) DAYS., Disp: 12 capsule, Rfl: 1  Review of Systems  Constitutional: Positive for activity change, appetite change and fatigue.  HENT: Negative.   Respiratory: Negative.   Cardiovascular: Negative.   Gastrointestinal: Positive for abdominal pain and nausea.  Neurological: Negative.   Psychiatric/Behavioral: Negative.     Social History  Substance Use Topics  . Smoking status: Never Smoker  . Smokeless tobacco: Never Used  . Alcohol use Yes     Comment: occasional   Objective:   There were no vitals taken for this visit.  Physical Exam  Constitutional: She is oriented to person, place, and time. She appears well-developed and well-nourished. No distress.  Cardiovascular: Normal rate, regular rhythm and normal heart sounds.  Exam reveals no gallop and no friction rub.   No murmur heard. Pulmonary/Chest: Effort normal and breath sounds normal. No respiratory distress. She has no wheezes. She has no rales.  Abdominal: Soft. Normal appearance and bowel sounds are normal. She exhibits no distension and no mass. There is no hepatosplenomegaly. There is no tenderness. There is no rebound, no guarding and no CVA tenderness.  Musculoskeletal: She exhibits no edema.  Neurological: She is alert and oriented to person, place, and time.  Skin: Skin is warm and dry.  She is not diaphoretic.       Assessment & Plan:     1. Viral gastroenteritis Improving. Continue symptomatic relief and push fluids.   2. Adult hypothyroidism H/O surgical removal for cancer of the thyroid gland. Has been stable previously on levothyroxine 118mcg. Will check labs as below and f/u pending results. - TSH  3. Cancer of thyroid [facility]) See above medical treatment plan. - TSH  4. Hypercholesteremia Will check labs as below and f/u pending results. -  Lipid Profile  5. Blood glucose elevated Will check labs as below and f/u pending results. - Comprehensive Metabolic Panel (CMET)  6. Fatigue, unspecified type Worsening. Possibly secondary to viral GI infection. Will check labs as below and f/u pending results. - CBC w/Diff/Platelet - Magnesium  7. Avitaminosis D H/O this and on high dose supplementation. Will check labs as below and f/u pending results. - CBC w/Diff/Platelet - Vitamin D (25 hydroxy)  8. Nocturnal leg cramps Will check labs as below and f/u pending results. Patient also wanting to start ketogenic diet for short while, thus will check labs to make sure she is able to try this diet.  - Magnesium  9. Need for hepatitis C screening test - Hepatitis C Antibody  10. Screening for HIV without presence of risk factors - HIV antibody (with reflex)       Mar Daring, PA-C  [facility]

## 2016-02-12 LAB — COMPREHENSIVE METABOLIC PANEL
ALT: 25 IU/L (ref 0–32)
AST: 20 IU/L (ref 0–40)
Albumin/Globulin Ratio: 1.9 (ref 1.2–2.2)
Albumin: 4.2 g/dL (ref 3.6–4.8)
Alkaline Phosphatase: 63 IU/L (ref 39–117)
BILIRUBIN TOTAL: 0.4 mg/dL (ref 0.0–1.2)
BUN/Creatinine Ratio: 34 — ABNORMAL HIGH (ref 12–28)
BUN: 21 mg/dL (ref 8–27)
CHLORIDE: 98 mmol/L (ref 96–106)
CO2: 24 mmol/L (ref 18–29)
Calcium: 9.1 mg/dL (ref 8.7–10.3)
Creatinine, Ser: 0.62 mg/dL (ref 0.57–1.00)
GFR calc non Af Amer: 95 mL/min/{1.73_m2} (ref >59)
GFR, EST AFRICAN AMERICAN: 109 mL/min/{1.73_m2} (ref >59)
GLUCOSE: 107 mg/dL — AB (ref 65–99)
Globulin, Total: 2.2 g/dL (ref 1.5–4.5)
Potassium: 4.6 mmol/L (ref 3.5–5.2)
Sodium: 140 mmol/L (ref 134–144)
Total Protein: 6.4 g/dL (ref 6.0–8.5)

## 2016-02-12 LAB — CBC WITH DIFFERENTIAL/PLATELET
BASOS ABS: 0 10*3/uL (ref 0.0–0.2)
Basos: 1 %
EOS (ABSOLUTE): 0.3 10*3/uL (ref 0.0–0.4)
Eos: 5 %
Hematocrit: 41.3 % (ref 34.0–46.6)
Hemoglobin: 14 g/dL (ref 11.1–15.9)
IMMATURE GRANS (ABS): 0 10*3/uL (ref 0.0–0.1)
IMMATURE GRANULOCYTES: 0 %
LYMPHS: 28 %
Lymphocytes Absolute: 1.3 10*3/uL (ref 0.7–3.1)
MCH: 28.7 pg (ref 26.6–33.0)
MCHC: 33.9 g/dL (ref 31.5–35.7)
MCV: 85 fL (ref 79–97)
Monocytes Absolute: 0.4 10*3/uL (ref 0.1–0.9)
Monocytes: 9 %
NEUTROS ABS: 2.7 10*3/uL (ref 1.4–7.0)
NEUTROS PCT: 57 %
PLATELETS: 302 10*3/uL (ref 150–379)
RBC: 4.88 x10E6/uL (ref 3.77–5.28)
RDW: 13.1 % (ref 12.3–15.4)
WBC: 4.7 10*3/uL (ref 3.4–10.8)

## 2016-02-12 LAB — VITAMIN D 25 HYDROXY (VIT D DEFICIENCY, FRACTURES): Vit D, 25-Hydroxy: 24.9 ng/mL — ABNORMAL LOW (ref 30.0–100.0)

## 2016-02-12 LAB — LIPID PANEL
CHOLESTEROL TOTAL: 256 mg/dL — AB (ref 100–199)
Chol/HDL Ratio: 5.6 ratio units — ABNORMAL HIGH (ref 0.0–4.4)
HDL: 46 mg/dL (ref >39)
LDL Calculated: 183 mg/dL — ABNORMAL HIGH (ref 0–99)
TRIGLYCERIDES: 133 mg/dL (ref 0–149)
VLDL Cholesterol Cal: 27 mg/dL (ref 5–40)

## 2016-02-12 LAB — HIV ANTIBODY (ROUTINE TESTING W REFLEX): HIV SCREEN 4TH GENERATION: NONREACTIVE

## 2016-02-12 LAB — TSH: TSH: 3.5 u[IU]/mL (ref 0.450–4.500)

## 2016-02-12 LAB — HEPATITIS C ANTIBODY

## 2016-02-12 LAB — MAGNESIUM: Magnesium: 2.2 mg/dL (ref 1.6–2.3)

## 2016-02-14 ENCOUNTER — Telehealth: Payer: Self-pay

## 2016-02-14 NOTE — Telephone Encounter (Signed)
Patient advised as below. Patient reports she will not start cholesterol medication. Patient reports she will work on diet and exercise.

## 2016-02-14 NOTE — Telephone Encounter (Signed)
-----   Message from Mar Daring, [place] sent at 02/14/2016  1:58 PM EST ----- All labs are within normal limits and stable with exception of cholesterol. Cholesterol levels have increased slightly from last year and ASCVD risk is borderline high at 7.04%, 7.5% is when we start medications. I would recommend considering adding a low dose statin at this time for cardiovascular protection if patient is agreeable. Continue healthy lifestyle modifications as well.  Thanks! -JB

## 2016-03-14 ENCOUNTER — Other Ambulatory Visit: Payer: Self-pay

## 2016-03-14 DIAGNOSIS — E039 Hypothyroidism, unspecified: Secondary | ICD-10-CM

## 2016-03-14 MED ORDER — LEVOTHYROXINE SODIUM 137 MCG PO TABS: ORAL_TABLET | ORAL | 1 refills | Status: DC

## 2016-03-14 NOTE — Telephone Encounter (Signed)
Prescription Refill Request on Levothyroxine 137 MCG Tablet QTY: 90 R:1

## 2016-03-22 ENCOUNTER — Other Ambulatory Visit: Payer: Self-pay

## 2016-03-22 DIAGNOSIS — E559 Vitamin D deficiency, unspecified: Secondary | ICD-10-CM

## 2016-03-22 MED ORDER — VITAMIN D (ERGOCALCIFEROL) 1.25 MG (50000 UNIT) PO CAPS: ORAL_CAPSULE | ORAL | 1 refills | Status: DC

## 2016-03-22 NOTE — Telephone Encounter (Signed)
Last ov 02/09/16 Last filled 04/14/15 Please review. Thank you. sd

## 2016-08-07 DIAGNOSIS — C73 Malignant neoplasm of thyroid gland: Secondary | ICD-10-CM | POA: Insufficient documentation

## 2016-08-10 ENCOUNTER — Ambulatory Visit (INDEPENDENT_AMBULATORY_CARE_PROVIDER_SITE_OTHER): Payer: Medicare Other | Admitting: Physician Assistant

## 2016-08-10 ENCOUNTER — Encounter: Payer: Self-pay | Admitting: Physician Assistant

## 2016-08-10 VITALS — BP 140/90 | HR 85 | Temp 97.9°F | Resp 16 | Ht 67.0 in | Wt 233.0 lb

## 2016-08-10 DIAGNOSIS — Z780 Asymptomatic menopausal state: Secondary | ICD-10-CM | POA: Diagnosis not present

## 2016-08-10 DIAGNOSIS — Z1231 Encounter for screening mammogram for malignant neoplasm of breast: Secondary | ICD-10-CM

## 2016-08-10 DIAGNOSIS — Z803 Family history of malignant neoplasm of breast: Secondary | ICD-10-CM

## 2016-08-10 DIAGNOSIS — R739 Hyperglycemia, unspecified: Secondary | ICD-10-CM

## 2016-08-10 DIAGNOSIS — Z6833 Body mass index (BMI) 33.0-33.9, adult: Secondary | ICD-10-CM | POA: Insufficient documentation

## 2016-08-10 DIAGNOSIS — E7800 Pure hypercholesterolemia, unspecified: Secondary | ICD-10-CM

## 2016-08-10 DIAGNOSIS — E559 Vitamin D deficiency, unspecified: Secondary | ICD-10-CM

## 2016-08-10 DIAGNOSIS — Z1211 Encounter for screening for malignant neoplasm of colon: Secondary | ICD-10-CM

## 2016-08-10 DIAGNOSIS — Z13820 Encounter for screening for osteoporosis: Secondary | ICD-10-CM

## 2016-08-10 DIAGNOSIS — C73 Malignant neoplasm of thyroid gland: Secondary | ICD-10-CM

## 2016-08-10 DIAGNOSIS — Z23 Encounter for immunization: Secondary | ICD-10-CM | POA: Diagnosis not present

## 2016-08-10 DIAGNOSIS — Z6836 Body mass index (BMI) 36.0-36.9, adult: Secondary | ICD-10-CM

## 2016-08-10 DIAGNOSIS — Z0000 Encounter for general adult medical examination without abnormal findings: Secondary | ICD-10-CM

## 2016-08-10 DIAGNOSIS — Z1239 Encounter for other screening for malignant neoplasm of breast: Secondary | ICD-10-CM

## 2016-08-10 DIAGNOSIS — E039 Hypothyroidism, unspecified: Secondary | ICD-10-CM | POA: Diagnosis not present

## 2016-08-10 NOTE — Progress Notes (Signed)
Patient: Erica Pineda, Female    DOB: May 14, 1950, 66 y.o.   MRN: [id number] Visit Date: 08/10/2016  Today's Provider: Mar Daring, PA-C   Chief Complaint  Patient presents with  . Annual Exam   Subjective:    Annual physical exam Erica Pineda is a 66 y.o. female who presents today for health maintenance and complete physical. She feels well. She reports exercising none. She reports she is sleeping well.  Last CPE: 08/09/15 Mammogram:01/26/15 Diagnostic-BI-RADS 2-recheck in 1 year. Colonoscopy:09/10/2006-Polyps ----------------------------------------------------------------- Patient is followed by [facility] for thyroid cancer. She is to follow-up in 12 months from 08/09/16.   Review of Systems  Constitutional: Negative.   HENT: Positive for hearing loss (minimal).   Eyes: Positive for photophobia and itching (allergies).  Respiratory: Negative.   Cardiovascular: Negative.   Gastrointestinal: Negative.   Endocrine: Negative.   Genitourinary: Negative.   Musculoskeletal: Positive for joint swelling.  Skin: Negative.   Allergic/Immunologic: Positive for environmental allergies.  Neurological: Negative.   Hematological: Negative.   Psychiatric/Behavioral: Negative.     Social History      She  reports that she has never smoked. She has never used smokeless tobacco. She reports that she drinks alcohol. She reports that she does not use drugs.       Social History   Social History  . Marital status: Married    Spouse name: Annie Main  . Number of children: 3  . Years of education: 72   Occupational History  . 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 Smoking status: Never Smoker  . Smokeless tobacco: Never Used  . Alcohol use Yes     Comment: occasional  . Drug use: No  . Sexual activity: Not Asked   Other Topics Concern  . None   Social History Narrative  . None    Past Medical History:  Diagnosis Date  . Cancer ([place])      Thyroid-follwed by Endocrine     Patient Active Problem List   Diagnosis Date Noted  . Lipoma of neck 11/25/2015  . Lipoma 06/30/2015  . Contact dermatitis 06/30/2015  . Chronic frontal sinusitis 06/11/2014  . Blood glucose elevated 06/11/2014  . Adult hypothyroidism 06/11/2014  . Avitaminosis D 06/11/2014  . 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 Dysphonia 04/07/2013  . Lump in neck 04/07/2013  . Cancer of thyroid ([place]) 03/18/2009  . Hay fever 05/07/2006  . Hypercholesteremia 05/07/2006    Past Surgical History:  Procedure Laterality Date  . ABDOMINAL HYSTERECTOMY    . APPENDECTOMY  1996  . BUNIONECTOMY  05/2007   and hammer-toe  . CYSTOCELE REPAIR  1989  . HERNIA REPAIR Left 1993  . HYMENECTOMY  1971  . TONSILLECTOMY AND ADENOIDECTOMY  1972  . TOTAL THYROIDECTOMY  02/08/2009    Family History        Family Status  Relation Status  . Sister Alive  . Mother Deceased  . Father Deceased  . Brother Alive  . Sister Alive  . Mat Aunt (Not Specified)  . Other (Not Specified)        Her family history includes Alzheimer's disease in her father and other; Breast cancer in her maternal aunt and sister; Diabetes in her father, other, sister, and sister; Heart disease in her other; Hyperlipidemia in her other; Hypertension in her other; Osteoporosis in her mother; Stroke in her mother.     Allergies  Allergen Reactions  .  Percodan  [Oxycodone-Aspirin]   . Simvastatin Swelling    Other reaction(s): Muscle Pain     Current Outpatient Prescriptions:  .  Cetirizine HCl 10 MG CAPS, Take 1 capsule by mouth as needed. Reported on 06/30/2015, Disp: , Rfl:  .  fluticasone (VERAMYST) 27.5 MCG/SPRAY nasal spray, Place 2 sprays into the nose as needed. Reported on 08/09/2015, Disp: , Rfl:  .  levothyroxine (SYNTHROID, LEVOTHROID) 137 MCG tablet, TAKE 1 TABLET (137 MCG TOTAL) BY MOUTH DAILY BEFORE BREAKFAST., Disp: 90 tablet, Rfl: 1 .  MULTIPLE VITAMINS-MINERALS PO, Take by mouth., Disp: ,  Rfl:  .  naproxen sodium (ANAPROX) 220 MG tablet, Take 440 mg by mouth 2 (two) times daily with a meal., Disp: , Rfl:  .  OMEGA-3 FATTY ACIDS PO, Take by mouth., Disp: , Rfl:  .  Vitamin D, Ergocalciferol, (DRISDOL) 50000 units CAPS capsule, TAKE 1 CAPSULE (50,000 UNITS TOTAL) BY MOUTH EVERY 7 (SEVEN) DAYS., Disp: 12 capsule, Rfl: 1   Patient Care Team: Mar Daring, PA-C as PCP - General (Family Medicine)      Objective:   Vitals: BP 140/90 (BP Location: Left Arm, Patient Position: Sitting, Cuff Size: Normal)   Pulse 85   Temp 97.9 F (36.6 C) (Oral)   Resp 16   Ht 5\' 7"  (1.702 m)   Wt 233 lb (105.7 kg)   BMI 36.49 kg/m    Physical Exam  Constitutional: She is oriented to person, place, and time. She appears well-developed and well-nourished. No distress.  HENT:  Head: Normocephalic and atraumatic.  Right Ear: Hearing, tympanic membrane, external ear and ear canal normal.  Left Ear: Hearing, tympanic membrane, external ear and ear canal normal.  Nose: Nose normal.  Mouth/Throat: Uvula is midline, oropharynx is clear and moist and mucous membranes are normal. No oropharyngeal exudate.  Eyes: Pupils are equal, round, and reactive to light. Conjunctivae and EOM are normal. Right eye exhibits no discharge. Left eye exhibits no discharge. No scleral icterus.  Neck: Normal range of motion. Neck supple. No JVD present. Carotid bruit is not present. No tracheal deviation present. No thyromegaly present.  Cardiovascular: Normal rate, regular rhythm, normal heart sounds and intact distal pulses.  Exam reveals no gallop and no friction rub.   No murmur heard. Pulmonary/Chest: Effort normal and breath sounds normal. No respiratory distress. She has no wheezes. She has no rales. She exhibits no tenderness. Right breast exhibits no inverted nipple, no mass, no nipple discharge, no skin change and no tenderness. Left breast exhibits no inverted nipple, no mass, no nipple discharge, no  skin change and no tenderness. Breasts are symmetrical.  Abdominal: Soft. Bowel sounds are normal. She exhibits no distension and no mass. There is no tenderness. There is no rebound and no guarding.  Musculoskeletal: Normal range of motion. She exhibits no edema or tenderness.  Lymphadenopathy:    She has no cervical adenopathy.  Neurological: She is alert and oriented to person, place, and time.  Skin: Skin is warm and dry. No rash noted. She is not diaphoretic.  Psychiatric: She has a normal mood and affect. Her behavior is normal. Judgment and thought content normal.  Vitals reviewed.    Depression Screen PHQ 2/9 Scores 08/10/2016 02/09/2016 08/06/2014  PHQ - 2 Score 0 0 0      Assessment & Plan:     Routine Health Maintenance and Physical Exam  Exercise Activities and Dietary recommendations Goals    . Reduce portion size  Continue to eat healthy. Continue Weight Watchers.         Immunization History  Administered Date(s) Administered  . Tdap 03/31/2010    Health Maintenance  Topic Date Due  . PAP SMEAR  02/04/1972  . DEXA SCAN  02/04/2016  . PNA vac Low Risk Adult (1 of 2 - PCV13) 02/04/2016  . INFLUENZA VACCINE  08/30/2016  . COLONOSCOPY  09/09/2016  . MAMMOGRAM  01/25/2017  . TETANUS/TDAP  03/30/2020  . Hepatitis C Screening  Completed  . HIV Screening  Completed     Discussed health benefits of physical activity, and encouraged her to engage in regular exercise appropriate for her age and condition.    1. Annual physical exam Normal physical exam today. She is to call the office in the meantime if she has any acute issue, questions or concerns.  2. Breast cancer screening Breast exam today was normal. There is family history of breast cancer in her sister. She does perform regular self breast exams. Mammogram was ordered as below. Mammograms are done at [place].  - MM Digital Diagnostic Bilat; Future  3. Adult hypothyroidism Stable.  Secondary to thyroidectomy for cancer. Followed by [facility].  4. Cancer of thyroid ([place]) S/P thyroidectomy.  5. Hypercholesteremia Labs reviewed in care everywhere from [place], showing an increase in cholesterol. Patient advised ASCVD 10 yr risk is elevated at 8.3%. Patient still refuses cholesterol lowering medication. Was agreeable to add daily low dose 81 mg ASA.   6. Blood glucose elevated HgBA1c was 6.2. Labs reviewed from care everywhere as stated above.  7. Avitaminosis D Stable at 29, followed by [facility].   8. BMI 36.0-36.9,adult Patient is currently doing keto diet.   62. Family history of breast cancer History in sister.   10. Screening for colon cancer Last colonoscopy was 2008. Referral placed for repeat screening as below.  - Ambulatory referral to Gastroenterology  11. Osteoporosis screening Due for screening bone density for osteoporosis. Had baseline done in 2013 at diagnosis of thyroid carcinoma. Results were not available.  - DG Bone Density; Future  12. Postmenopausal estrogen deficiency See above medical treatment plan. - DG Bone Density; Future  13. Need for pneumococcal vaccination Prevnar 13 Vaccine given to patient without complications. Patient sat for 15 minutes after administration and was tolerated well without adverse effects. - Pneumococcal conjugate vaccine 13-valent  14. Need for shingles vaccine Shingrix Vaccine given to patient without complications. Patient sat for 15 minutes after administration and was tolerated well without adverse effects. She is to return in 2 months for second vaccination.  - Varicella-zoster vaccine IM (Shingrix)  --------------------------------------------------------------------    Mar Daring, PA-C  [place]

## 2016-08-10 NOTE — Patient Instructions (Signed)
Health Maintenance for Postmenopausal Women Menopause is a normal process in which your reproductive ability comes to an end. This process happens gradually over a span of months to years, usually between the ages of 22 and 9. Menopause is complete when you have missed 12 consecutive menstrual periods. It is important to talk with your health care provider about some of the most common conditions that affect postmenopausal women, such as heart disease, cancer, and bone loss (osteoporosis). Adopting a healthy lifestyle and getting preventive care can help to promote your health and wellness. Those actions can also lower your chances of developing some of these common conditions. What should I know about menopause? During menopause, you may experience a number of symptoms, such as:  Moderate-to-severe hot flashes.  Night sweats.  Decrease in sex drive.  Mood swings.  Headaches.  Tiredness.  Irritability.  Memory problems.  Insomnia.  Choosing to treat or not to treat menopausal changes is an individual decision that you make with your health care provider. What should I know about hormone replacement therapy and supplements? Hormone therapy products are effective for treating symptoms that are associated with menopause, such as hot flashes and night sweats. Hormone replacement carries certain risks, especially as you become older. If you are thinking about using estrogen or estrogen with progestin treatments, discuss the benefits and risks with your health care provider. What should I know about heart disease and stroke? Heart disease, heart attack, and stroke become more likely as you age. This may be due, in part, to the hormonal changes that your body experiences during menopause. These can affect how your body processes dietary fats, triglycerides, and cholesterol. Heart attack and stroke are both medical emergencies. There are many things that you can do to help prevent heart disease  and stroke:  Have your blood pressure checked at least every 1-2 years. High blood pressure causes heart disease and increases the risk of stroke.  If you are 53-22 years old, ask your health care provider if you should take aspirin to prevent a heart attack or a stroke.  Do not use any tobacco products, including cigarettes, chewing tobacco, or electronic cigarettes. If you need help quitting, ask your health care provider.  It is important to eat a healthy diet and maintain a healthy weight. ? Be sure to include plenty of vegetables, fruits, low-fat dairy products, and lean protein. ? Avoid eating foods that are high in solid fats, added sugars, or salt (sodium).  Get regular exercise. This is one of the most important things that you can do for your health. ? Try to exercise for at least 150 minutes each week. The type of exercise that you do should increase your heart rate and make you sweat. This is known as moderate-intensity exercise. ? Try to do strengthening exercises at least twice each week. Do these in addition to the moderate-intensity exercise.  Know your numbers.Ask your health care provider to check your cholesterol and your blood glucose. Continue to have your blood tested as directed by your health care provider.  What should I know about cancer screening? There are several types of cancer. Take the following steps to reduce your risk and to catch any cancer development as early as possible. Breast Cancer  Practice breast self-awareness. ? This means understanding how your breasts normally appear and feel. ? It also means doing regular breast self-exams. Let your health care provider know about any changes, no matter how small.  If you are 40  or older, have a clinician do a breast exam (clinical breast exam or CBE) every year. Depending on your age, family history, and medical history, it may be recommended that you also have a yearly breast X-ray (mammogram).  If you  have a family history of breast cancer, talk with your health care provider about genetic screening.  If you are at high risk for breast cancer, talk with your health care provider about having an MRI and a mammogram every year.  Breast cancer (BRCA) gene test is recommended for women who have family members with BRCA-related cancers. Results of the assessment will determine the need for genetic counseling and BRCA1 and for BRCA2 testing. BRCA-related cancers include these types: ? Breast. This occurs in males or females. ? Ovarian. ? Tubal. This may also be called fallopian tube cancer. ? Cancer of the abdominal or pelvic lining (peritoneal cancer). ? Prostate. ? Pancreatic.  Cervical, Uterine, and Ovarian Cancer Your health care provider may recommend that you be screened regularly for cancer of the pelvic organs. These include your ovaries, uterus, and vagina. This screening involves a pelvic exam, which includes checking for microscopic changes to the surface of your cervix (Pap test).  For women ages 21-65, health care providers may recommend a pelvic exam and a Pap test every three years. For women ages 79-65, they may recommend the Pap test and pelvic exam, combined with testing for human papilloma virus (HPV), every five years. Some types of HPV increase your risk of cervical cancer. Testing for HPV may also be done on women of any age who have unclear Pap test results.  Other health care providers may not recommend any screening for nonpregnant women who are considered low risk for pelvic cancer and have no symptoms. Ask your health care provider if a screening pelvic exam is right for you.  If you have had past treatment for cervical cancer or a condition that could lead to cancer, you need Pap tests and screening for cancer for at least 20 years after your treatment. If Pap tests have been discontinued for you, your risk factors (such as having a new sexual partner) need to be  reassessed to determine if you should start having screenings again. Some women have medical problems that increase the chance of getting cervical cancer. In these cases, your health care provider may recommend that you have screening and Pap tests more often.  If you have a family history of uterine cancer or ovarian cancer, talk with your health care provider about genetic screening.  If you have vaginal bleeding after reaching menopause, tell your health care provider.  There are currently no reliable tests available to screen for ovarian cancer.  Lung Cancer Lung cancer screening is recommended for adults 69-62 years old who are at high risk for lung cancer because of a history of smoking. A yearly low-dose CT scan of the lungs is recommended if you:  Currently smoke.  Have a history of at least 30 pack-years of smoking and you currently smoke or have quit within the past 15 years. A pack-year is smoking an average of one pack of cigarettes per day for one year.  Yearly screening should:  Continue until it has been 15 years since you quit.  Stop if you develop a health problem that would prevent you from having lung cancer treatment.  Colorectal Cancer  This type of cancer can be detected and can often be prevented.  Routine colorectal cancer screening usually begins at  age 42 and continues through age 45.  If you have risk factors for colon cancer, your health care provider may recommend that you be screened at an earlier age.  If you have a family history of colorectal cancer, talk with your health care provider about genetic screening.  Your health care provider may also recommend using home test kits to check for hidden blood in your stool.  A small camera at the end of a tube can be used to examine your colon directly (sigmoidoscopy or colonoscopy). This is done to check for the earliest forms of colorectal cancer.  Direct examination of the colon should be repeated every  5-10 years until age 71. However, if early forms of precancerous polyps or small growths are found or if you have a family history or genetic risk for colorectal cancer, you may need to be screened more often.  Skin Cancer  Check your skin from head to toe regularly.  Monitor any moles. Be sure to tell your health care provider: ? About any new moles or changes in moles, especially if there is a change in a mole's shape or color. ? If you have a mole that is larger than the size of a pencil eraser.  If any of your family members has a history of skin cancer, especially at a young age, talk with your health care provider about genetic screening.  Always use sunscreen. Apply sunscreen liberally and repeatedly throughout the day.  Whenever you are outside, protect yourself by wearing long sleeves, pants, a wide-brimmed hat, and sunglasses.  What should I know about osteoporosis? Osteoporosis is a condition in which bone destruction happens more quickly than new bone creation. After menopause, you may be at an increased risk for osteoporosis. To help prevent osteoporosis or the bone fractures that can happen because of osteoporosis, the following is recommended:  If you are 46-71 years old, get at least 1,000 mg of calcium and at least 600 mg of vitamin D per day.  If you are older than age 55 but younger than age 65, get at least 1,200 mg of calcium and at least 600 mg of vitamin D per day.  If you are older than age 54, get at least 1,200 mg of calcium and at least 800 mg of vitamin D per day.  Smoking and excessive alcohol intake increase the risk of osteoporosis. Eat foods that are rich in calcium and vitamin D, and do weight-bearing exercises several times each week as directed by your health care provider. What should I know about how menopause affects my mental health? Depression may occur at any age, but it is more common as you become older. Common symptoms of depression  include:  Low or sad mood.  Changes in sleep patterns.  Changes in appetite or eating patterns.  Feeling an overall lack of motivation or enjoyment of activities that you previously enjoyed.  Frequent crying spells.  Talk with your health care provider if you think that you are experiencing depression. What should I know about immunizations? It is important that you get and maintain your immunizations. These include:  Tetanus, diphtheria, and pertussis (Tdap) booster vaccine.  Influenza every year before the flu season begins.  Pneumonia vaccine.  Shingles vaccine.  Your health care provider may also recommend other immunizations. This information is not intended to replace advice given to you by your health care provider. Make sure you discuss any questions you have with your health care provider. Document Released: 03/10/2005  Document Revised: 08/06/2015 Document Reviewed: 10/20/2014 Elsevier Interactive Patient Education  2018 [employer]  

## 2016-08-14 DIAGNOSIS — R0789 Other chest pain: Secondary | ICD-10-CM | POA: Insufficient documentation

## 2016-08-15 LAB — HM MAMMOGRAPHY

## 2016-08-15 LAB — HM DEXA SCAN: HM DEXA SCAN: NORMAL

## 2016-08-17 ENCOUNTER — Encounter: Payer: Self-pay | Admitting: Physician Assistant

## 2016-09-04 ENCOUNTER — Encounter: Payer: Self-pay | Admitting: Physician Assistant

## 2016-10-03 ENCOUNTER — Other Ambulatory Visit: Payer: Self-pay | Admitting: Physician Assistant

## 2016-10-03 DIAGNOSIS — E559 Vitamin D deficiency, unspecified: Secondary | ICD-10-CM

## 2016-10-10 ENCOUNTER — Ambulatory Visit (INDEPENDENT_AMBULATORY_CARE_PROVIDER_SITE_OTHER): Payer: Medicare Other | Admitting: Physician Assistant

## 2016-10-10 ENCOUNTER — Encounter: Payer: Self-pay | Admitting: Physician Assistant

## 2016-10-10 VITALS — BP 110/70 | HR 80 | Temp 98.2°F | Resp 16 | Ht 67.0 in | Wt 216.2 lb

## 2016-10-10 DIAGNOSIS — Z23 Encounter for immunization: Secondary | ICD-10-CM | POA: Diagnosis not present

## 2016-10-10 DIAGNOSIS — Z2821 Immunization not carried out because of patient refusal: Secondary | ICD-10-CM

## 2016-10-10 DIAGNOSIS — Z6833 Body mass index (BMI) 33.0-33.9, adult: Secondary | ICD-10-CM | POA: Diagnosis not present

## 2016-10-10 DIAGNOSIS — E7800 Pure hypercholesterolemia, unspecified: Secondary | ICD-10-CM

## 2016-10-10 DIAGNOSIS — R739 Hyperglycemia, unspecified: Secondary | ICD-10-CM | POA: Diagnosis not present

## 2016-10-10 NOTE — Patient Instructions (Signed)
Fad Diets A fad diet is a diet intended for fast weight loss. Popular fad diets include:  Low- and no-carbohydrate diets.  Liquid formula diets.  Very low-calorie diets.  Special food combination diets, such as the raw food diet.  Fad diets usually do not lead to permanent weight loss. How do I recognize a fad diet? If the information about a way of eating promises results that sound too good to be true, it is probably a fad diet. Fad diets often:  Promote "magic" or "miracle" foods.  Guarantee a quick fix to your weight problems.  List "good foods" and "bad foods."  Have rigid menus and a strict calorie restriction.  Require taking pills, herbs, or powders.  Require you to buy a particular product.  Require you to skip meals or to replace meals with a special drink or food bar.  Require specific food combinations.  Require you to cut out an entire food group, such as carbohydrates or fat.  Require you to eat large quantities of one or more foods.  Do not include a health warning.  Do not require you to increase your physical activity.  Make dramatic claims that are refuted by reputable scientific organizations.  What are the dangers of fad diets? There is no scientific evidence that fad diets work, and some may do more harm than good. Dangers of fad diets include:  Weight gain after stopping the diet. Most fad diets are too impractical to follow for the long term. People eventually stop dieting and go back to their usual eating patterns. This creates a yo-yo effect of weight loss and weight gain that is hard on the body and mind.  Nutrient deficiency. If the diet restricts certain types of food, there is a risk of becoming deficient in certain vitamins and minerals.  Problems associated with inactivity. Many fad diets do not encourage physical activity. Physical activity is key to maintaining long-term weight loss. Being inactive is also a major risk factor for heart  disease, stroke, and diabetes.  How can I lose weight without a fad diet?  A healthy way to lose weight and maintain a healthy weight is to:  Eat fewer calories.  Choose healthy foods, such as vegetables, whole grains, fruits, lean proteins, and healthy fats.  Balance your overall food intake with physical activity.  All foods, in moderation, can be a part of your eating plan while you work to achieve a healthy weight. Making lifestyle changes to your eating and physical activity habits will allow you to work toward lasting change. This information is not intended to replace advice given to you by your health care provider. Make sure you discuss any questions you have with your health care provider. Document Released: 10/31/2013 Document Revised: 08/06/2015 Document Reviewed: 07/01/2013 Elsevier Interactive Patient Education  2018 [employer].

## 2016-10-10 NOTE — Progress Notes (Signed)
Patient: Erica Pineda Female    DOB: 08-Dec-1950   66 y.o.   MRN: [id number] Visit Date: 10/10/2016  Today's Provider: Mar Daring, PA-C   Chief Complaint  Patient presents with  . Immunizations  . Weight Check   Subjective:    HPI Patient here today for 2nd dose of Shingrix.   Patient requesting to have labs done, patient reports she starting doing the KETO diet in July and reports she has lost about 30 pounds.     Allergies  Allergen Reactions  . Percodan  [Oxycodone-Aspirin]   . Simvastatin Swelling    Other reaction(s): Muscle Pain     Current Outpatient Prescriptions:  .  levothyroxine (SYNTHROID, LEVOTHROID) 137 MCG tablet, TAKE 1 TABLET (137 MCG TOTAL) BY MOUTH DAILY BEFORE BREAKFAST., Disp: 90 tablet, Rfl: 1 .  Vitamin D, Ergocalciferol, (DRISDOL) 50000 units CAPS capsule, TAKE 1 CAPSULE (50,000 UNITS TOTAL) BY MOUTH EVERY 7 (SEVEN) DAYS., Disp: 12 capsule, Rfl: 3 .  Cetirizine HCl 10 MG CAPS, Take 1 capsule by mouth as needed. Reported on 06/30/2015, Disp: , Rfl:  .  fluticasone (VERAMYST) 27.5 MCG/SPRAY nasal spray, Place 2 sprays into the nose as needed. Reported on 08/09/2015, Disp: , Rfl:  .  MULTIPLE VITAMINS-MINERALS PO, Take by mouth., Disp: , Rfl:  .  naproxen sodium (ANAPROX) 220 MG tablet, Take 440 mg by mouth 2 (two) times daily with a meal., Disp: , Rfl:  .  OMEGA-3 FATTY ACIDS PO, Take by mouth., Disp: , Rfl:   Review of Systems  Constitutional: Negative.   Respiratory: Negative.   Cardiovascular: Negative.   Gastrointestinal: Negative.   Musculoskeletal: Negative.     Social History  Substance Use Topics  . Smoking status: Never Smoker  . Smokeless tobacco: Never Used  . Alcohol use Yes     Comment: occasional   Objective:   BP 110/70 (BP Location: Left Arm, Patient Position: Sitting, Cuff Size: Large)   Pulse 80   Temp 98.2 F (36.8 C) (Oral)   Resp 16   Ht 5\' 7"  (1.702 m)   Wt 216 lb 3.2 oz (98.1 kg)   SpO2 97%   BMI 33.86  kg/m  Vitals:   10/10/16 0851  BP: 110/70  Pulse: 80  Resp: 16  Temp: 98.2 F (36.8 C)  TempSrc: Oral  SpO2: 97%  Weight: 216 lb 3.2 oz (98.1 kg)  Height: 5\' 7"  (1.702 m)     Physical Exam  Constitutional: She appears well-developed and well-nourished. No distress.  Neck: Normal range of motion. Neck supple. No JVD present. No tracheal deviation present. No thyromegaly present.  Cardiovascular: Normal rate, regular rhythm and normal heart sounds.  Exam reveals no gallop and no friction rub.   No murmur heard. Pulmonary/Chest: Effort normal and breath sounds normal. No respiratory distress. She has no wheezes. She has no rales.  Lymphadenopathy:    She has no cervical adenopathy.  Skin: She is not diaphoretic.  Vitals reviewed.       Assessment & Plan:     1. Hypercholesterolemia Patient has lost 30 pounds and made lifestyle changes and is wishing to recheck and see where her labs are now. Will check labs as below and f/u pending results. - Lipid Profile - Basic Metabolic Panel (BMET)  2. Elevated blood sugar Will check labs as below and f/u pending results. - Basic Metabolic Panel (BMET) - HgB A1c  3. BMI 33.0-33.9,adult Counseled patient on healthy lifestyle modifications  including dieting and exercise.  - Lipid Profile - Basic Metabolic Panel (BMET) - HgB A1c  4. Need for shingles vaccine Shingrix Vaccine given to patient without complications. Patient sat for 15 minutes after administration and was tolerated well without adverse effects. - Varicella-zoster vaccine IM (Shingrix)  5. Influenza vaccination declined Patient declined at this time but is willing to get in Nov when she returns from [place].        Mar Daring, PA-C  [facility]

## 2016-10-11 ENCOUNTER — Ambulatory Visit: Payer: Self-pay | Admitting: Physician Assistant

## 2016-10-11 LAB — BASIC METABOLIC PANEL
BUN: 20 mg/dL (ref 7–25)
CALCIUM: 9.4 mg/dL (ref 8.6–10.4)
CHLORIDE: 104 mmol/L (ref 98–110)
CO2: 25 mmol/L (ref 20–32)
Creat: 0.67 mg/dL (ref 0.50–0.99)
Glucose, Bld: 112 mg/dL — ABNORMAL HIGH (ref 65–99)
Potassium: 4.3 mmol/L (ref 3.5–5.3)
SODIUM: 139 mmol/L (ref 135–146)

## 2016-10-11 LAB — HEMOGLOBIN A1C
EAG (MMOL/L): 6.2 (calc)
HEMOGLOBIN A1C: 5.5 %{Hb} (ref <5.7)
Mean Plasma Glucose: 111 (calc)

## 2016-10-11 LAB — LIPID PANEL
CHOL/HDL RATIO: 5.5 (calc) — AB (ref <5.0)
CHOLESTEROL: 253 mg/dL — AB (ref <200)
HDL: 46 mg/dL — AB (ref >50)
LDL CHOLESTEROL (CALC): 181 mg/dL — AB
Non-HDL Cholesterol (Calc): 207 mg/dL (calc) — ABNORMAL HIGH (ref <130)
TRIGLYCERIDES: 125 mg/dL (ref <150)

## 2016-10-12 ENCOUNTER — Telehealth: Payer: Self-pay | Admitting: Physician Assistant

## 2016-10-12 NOTE — Telephone Encounter (Signed)
Pt is returning call.  [phone]/[id number]

## 2016-10-17 NOTE — Telephone Encounter (Signed)
-----   Message from Mar Daring, PA-C sent at 10/11/2016  1:37 PM EDT ----- Cholesterol still up at 253, LDL at 181. Triglycerides better at 125. A1c much improved 5.5 from 6.2 and 6.0 2 years ago. Kidney function normal.

## 2016-10-17 NOTE — Telephone Encounter (Signed)
LMTCB is regarding her labs results. Documentation on why patient was returning called is under labs results. Please try to get a nurse if I am not available to give results.   Thanks,  -Divante Kotch

## 2016-10-17 NOTE — Telephone Encounter (Signed)
lmtcb

## 2016-10-18 NOTE — Telephone Encounter (Signed)
lmtcb

## 2016-10-20 NOTE — Telephone Encounter (Signed)
Patient advised as below.  

## 2017-01-29 LAB — HM COLONOSCOPY

## 2017-07-05 ENCOUNTER — Ambulatory Visit (INDEPENDENT_AMBULATORY_CARE_PROVIDER_SITE_OTHER): Payer: Medicare Other

## 2017-07-05 VITALS — BP 126/74 | HR 77 | Temp 98.4°F | Ht 67.0 in | Wt 219.6 lb

## 2017-07-05 DIAGNOSIS — Z0000 Encounter for general adult medical examination without abnormal findings: Secondary | ICD-10-CM

## 2017-07-05 NOTE — Patient Instructions (Addendum)
Erica Pineda , Thank you for taking time to come for your Medicare Wellness Visit. I appreciate your ongoing commitment to your health goals. Please review the following plan we discussed and let me know if I can assist you in the future.   Screening recommendations/referrals: Colonoscopy: Up to date Mammogram: Up to date Bone Density: Up to date Recommended yearly ophthalmology/optometry visit for glaucoma screening and checkup Recommended yearly dental visit for hygiene and checkup  Vaccinations: Influenza vaccine: N/A Pneumococcal vaccine: Up to date Tdap vaccine: Up to date Shingles vaccine: Up to date     Advanced directives: Please bring a copy of your POA (Power of [place]) and/or Living Will to your next appointment.   Conditions/risks identified: Obesity- continue current diet plan (Keto diet) to help aid in weight loss.   Next appointment: 08/21/17 @ 10 AM with Fenton Malling.   Preventive Care 66 Years and Older, Female Preventive care refers to lifestyle choices and visits with your health care provider that can promote health and wellness. What does preventive care include?  A yearly physical exam. This is also called an annual well check.  Dental exams once or twice a year.  Routine eye exams. Ask your health care provider how often you should have your eyes checked.  Personal lifestyle choices, including:  Daily care of your teeth and gums.  Regular physical activity.  Eating a healthy diet.  Avoiding tobacco and drug use.  Limiting alcohol use.  Practicing safe sex.  Taking low-dose aspirin every day.  Taking vitamin and mineral supplements as recommended by your health care provider. What happens during an annual well check? The services and screenings done by your health care provider during your annual well check will depend on your age, overall health, lifestyle risk factors, and family history of disease. Counseling  Your health care provider  may ask you questions about your:  Alcohol use.  Tobacco use.  Drug use.  Emotional well-being.  Home and relationship well-being.  Sexual activity.  Eating habits.  History of falls.  Memory and ability to understand (cognition).  Work and work Statistician.  Reproductive health. Screening  You may have the following tests or measurements:  Height, weight, and BMI.  Blood pressure.  Lipid and cholesterol levels. These may be checked every 5 years, or more frequently if you are over 67 years old.  Skin check.  Lung cancer screening. You may have this screening every year starting at age 9 if you have a 30-pack-year history of smoking and currently smoke or have quit within the past 15 years.  Fecal occult blood test (FOBT) of the stool. You may have this test every year starting at age 73.  Flexible sigmoidoscopy or colonoscopy. You may have a sigmoidoscopy every 5 years or a colonoscopy every 10 years starting at age 41.  Hepatitis C blood test.  Hepatitis B blood test.  Sexually transmitted disease (STD) testing.  Diabetes screening. This is done by checking your blood sugar (glucose) after you have not eaten for a while (fasting). You may have this done every 1-3 years.  Bone density scan. This is done to screen for osteoporosis. You may have this done starting at age 77.  Mammogram. This may be done every 1-2 years. Talk to your health care provider about how often you should have regular mammograms. Talk with your health care provider about your test results, treatment options, and if necessary, the need for more tests. Vaccines  Your health care provider  may recommend certain vaccines, such as:  Influenza vaccine. This is recommended every year.  Tetanus, diphtheria, and acellular pertussis (Tdap, Td) vaccine. You may need a Td booster every 10 years.  Zoster vaccine. You may need this after age 70.  Pneumococcal 13-valent conjugate (PCV13) vaccine.  One dose is recommended after age 37.  Pneumococcal polysaccharide (PPSV23) vaccine. One dose is recommended after age 37. Talk to your health care provider about which screenings and vaccines you need and how often you need them. This information is not intended to replace advice given to you by your health care provider. Make sure you discuss any questions you have with your health care provider. Document Released: 02/12/2015 Document Revised: 10/06/2015 Document Reviewed: 11/17/2014 Elsevier Interactive Patient Education  2017 [place] Prevention in the Home Falls can cause injuries. They can happen to people of all ages. There are many things you can do to make your home safe and to help prevent falls. What can I do on the outside of my home?  Regularly fix the edges of walkways and driveways and fix any cracks.  Remove anything that might make you trip as you walk through a door, such as a raised step or threshold.  Trim any bushes or trees on the path to your home.  Use bright outdoor lighting.  Clear any walking paths of anything that might make someone trip, such as rocks or tools.  Regularly check to see if handrails are loose or broken. Make sure that both sides of any steps have handrails.  Any raised decks and porches should have guardrails on the edges.  Have any leaves, snow, or ice cleared regularly.  Use sand or salt on walking paths during winter.  Clean up any spills in your garage right away. This includes oil or grease spills. What can I do in the bathroom?  Use night lights.  Install grab bars by the toilet and in the tub and shower. Do not use towel bars as grab bars.  Use non-skid mats or decals in the tub or shower.  If you need to sit down in the shower, use a plastic, non-slip stool.  Keep the floor dry. Clean up any water that spills on the floor as soon as it happens.  Remove soap buildup in the tub or shower regularly.  Attach bath  mats securely with double-sided non-slip rug tape.  Do not have throw rugs and other things on the floor that can make you trip. What can I do in the bedroom?  Use night lights.  Make sure that you have a light by your bed that is easy to reach.  Do not use any sheets or blankets that are too big for your bed. They should not hang down onto the floor.  Have a firm chair that has side arms. You can use this for support while you get dressed.  Do not have throw rugs and other things on the floor that can make you trip. What can I do in the kitchen?  Clean up any spills right away.  Avoid walking on wet floors.  Keep items that you use a lot in easy-to-reach places.  If you need to reach something above you, use a strong step stool that has a grab bar.  Keep electrical cords out of the way.  Do not use floor polish or wax that makes floors slippery. If you must use wax, use non-skid floor wax.  Do not have throw rugs  and other things on the floor that can make you trip. What can I do with my stairs?  Do not leave any items on the stairs.  Make sure that there are handrails on both sides of the stairs and use them. Fix handrails that are broken or loose. Make sure that handrails are as long as the stairways.  Check any carpeting to make sure that it is firmly attached to the stairs. Fix any carpet that is loose or worn.  Avoid having throw rugs at the top or bottom of the stairs. If you do have throw rugs, attach them to the floor with carpet tape.  Make sure that you have a light switch at the top of the stairs and the bottom of the stairs. If you do not have them, ask someone to add them for you. What else can I do to help prevent falls?  Wear shoes that:  Do not have high heels.  Have rubber bottoms.  Are comfortable and fit you well.  Are closed at the toe. Do not wear sandals.  If you use a stepladder:  Make sure that it is fully opened. Do not climb a closed  stepladder.  Make sure that both sides of the stepladder are locked into place.  Ask someone to hold it for you, if possible.  Clearly mark and make sure that you can see:  Any grab bars or handrails.  First and last steps.  Where the edge of each step is.  Use tools that help you move around (mobility aids) if they are needed. These include:  Canes.  Walkers.  Scooters.  Crutches.  Turn on the lights when you go into a dark area. Replace any light bulbs as soon as they burn out.  Set up your furniture so you have a clear path. Avoid moving your furniture around.  If any of your floors are uneven, fix them.  If there are any pets around you, be aware of where they are.  Review your medicines with your doctor. Some medicines can make you feel dizzy. This can increase your chance of falling. Ask your doctor what other things that you can do to help prevent falls. This information is not intended to replace advice given to you by your health care provider. Make sure you discuss any questions you have with your health care provider. Document Released: 11/12/2008 Document Revised: 06/24/2015 Document Reviewed: 02/20/2014 Elsevier Interactive Patient Education  2017 [employer].

## 2017-07-05 NOTE — Progress Notes (Addendum)
Subjective:   Erica Pineda is a 67 y.o. female who presents for an Initial Medicare Annual Wellness Visit.  Review of Systems    N/A  Cardiac Risk Factors include: advanced age (>61men, >52 women);obesity (BMI >30kg/m2);hypertension     Objective:    Today's Vitals   07/05/17 0910  BP: 126/74  Pulse: 77  Temp: 98.4 F (36.9 C)  TempSrc: Oral  Weight: 219 lb 9.6 oz (99.6 kg)  Height: 5\' 7"  (1.702 m)  PainSc: 0-No pain   Body mass index is 34.39 kg/m.  Advanced Directives 07/05/2017 08/10/2016 08/06/2014 08/06/2014  Does Patient Have a Medical Advance Directive? Yes Yes - Yes  Type of Advance Directive [place];Living will Healthcare Power of [place] of [place] will  Copy of [place] in Chart? No - copy requested - - -    Current Medications (verified) Outpatient Encounter Medications as of 07/05/2017  Medication Sig  . Cetirizine HCl 10 MG CAPS Take 1 capsule by mouth as needed. Reported on 06/30/2015  . Cholecalciferol (VITAMIN D3) 1000 units CAPS Take by mouth.  Marland Kitchen econazole nitrate 1 % cream Apply 1 application topically 2 (two) times daily.  . fluticasone (VERAMYST) 27.5 MCG/SPRAY nasal spray Place 2 sprays into the nose as needed. Reported on 08/09/2015  . levothyroxine (SYNTHROID, LEVOTHROID) 137 MCG tablet TAKE 1 TABLET (137 MCG TOTAL) BY MOUTH DAILY BEFORE BREAKFAST. (Patient taking differently: TAKE 1 TABLET (137 MCG TOTAL) BY MOUTH DAILY BEFORE BREAKFAST.)  . naproxen sodium (ANAPROX) 220 MG tablet Take 440 mg by mouth 2 (two) times daily with a meal.  . Vitamin D, Ergocalciferol, (DRISDOL) 50000 units CAPS capsule TAKE 1 CAPSULE (50,000 UNITS TOTAL) BY MOUTH EVERY 7 (SEVEN) DAYS.  Marland Kitchen MULTIPLE VITAMINS-MINERALS PO Take by mouth daily.   . OMEGA-3 FATTY ACIDS PO Take by mouth.   No facility-administered encounter medications on file as of 07/05/2017.     Allergies (verified) Percodan  [oxycodone-aspirin] and  Simvastatin   History: Past Medical History:  Diagnosis Date  . Cancer ([place])    Thyroid-follwed by Endocrine   Past Surgical History:  Procedure Laterality Date  . ABDOMINAL HYSTERECTOMY    . APPENDECTOMY  1996  . BUNIONECTOMY  05/2007   and hammer-toe  . CYSTOCELE REPAIR  1989  . HERNIA REPAIR Left 1993  . HYMENECTOMY  1971  . TONSILLECTOMY AND ADENOIDECTOMY  1972  . TOTAL THYROIDECTOMY  02/08/2009   Family History  Problem Relation Age of Onset  . Breast cancer Sister   . Diabetes Sister   . Osteoporosis Mother   . Stroke Mother        in 2014  . Alzheimer's disease Father   . Diabetes Father   . Diabetes Sister   . Breast cancer Maternal Aunt   . Hypertension Other   . Hyperlipidemia Other   . Heart disease Other   . Diabetes Other   . Alzheimer's disease Other    Social History   Socioeconomic History  . Marital status: Married    Spouse name: Erica Pineda  . Number of children: 3  . Years of education: 17  . Highest education level: Master's degree (e.g., MA, MS, MEng, MEd, MSW, MBA)  Occupational History  . 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 Financial resource strain: Not hard at all  . Food insecurity:    Worry:  Never true    Inability: Never true  . Transportation needs:    Medical: No    Non-medical: No  Tobacco Use  . Smoking status: Never Smoker  . Smokeless tobacco: Never Used  Substance and Sexual Activity  . Alcohol use: Yes    Comment: occasional  . Drug use: No  . Sexual activity: Not on file  Lifestyle  . Physical activity:    Days per week: Not on file    Minutes per session: Not on file  . Stress: Not at all  Relationships  . Social connections:    Talks on phone: Not on file    Gets together: Not on file    Attends religious service: Not on file    Active member of club or organization: Not on file    Attends meetings of clubs or organizations: Not on  file    Relationship status: Not on file  Other Topics Concern  . Not on file  Social History Narrative  . Not on file    Tobacco Counseling Counseling given: Not Answered   Clinical Intake:  Pre-visit preparation completed: Yes  Pain : No/denies pain Pain Score: 0-No pain     Nutritional Status: BMI > 30  Obese Nutritional Risks: None Diabetes: No  How often do you need to have someone help you when you read instructions, pamphlets, or other written materials from your doctor or pharmacy?: 1 - Never  Interpreter Needed?: No  Information entered by :: [facility], LPN   Activities of Daily Living In your present state of health, do you have any difficulty performing the following activities: 07/05/2017  Hearing? N  Vision? N  Difficulty concentrating or making decisions? N  Walking or climbing stairs? N  Dressing or bathing? N  Doing errands, shopping? N  Preparing Food and eating ? N  Using the Toilet? N  In the past six months, have you accidently leaked urine? Y  Comment Occasionally, had bladder tacted with hysterectomy  Do you have problems with loss of bowel control? N  Managing your Medications? N  Managing your Finances? N  Housekeeping or managing your Housekeeping? N  Some recent data might be hidden     Immunizations and Health Maintenance Immunization History  Administered Date(s) Administered  . Influenza,inj,Quad PF,6+ Mos 12/16/2012  . Pneumococcal Conjugate-13 08/10/2016  . Tdap 03/31/2010  . Zoster Recombinat (Shingrix) 08/10/2016, 10/10/2016   There are no preventive care reminders to display for this patient.  Patient Care Team: Mar Daring, PA-C as PCP - General (Family Medicine) Beverly Gust, MD as Consulting Physician (Otolaryngology) Lanier Clam, MD as Consulting Physician (Internal Medicine)  Indicate any recent Medical Services you may have received from other than [facility] providers in the past year (date may be  approximate).     Assessment:   This is a routine wellness examination for Erica Pineda.  Hearing/Vision screen No exam data present  Dietary issues and exercise activities discussed: Current Exercise Habits: Structured exercise class, Type of exercise: yoga, Time (Minutes): 60, Frequency (Times/Week): 4, Weekly Exercise (Minutes/Week): 240, Intensity: Mild, Exercise limited by: None identified  Goals    . Reduce portion size     Continue to eat healthy. Continue Weight Watchers.        Depression Screen PHQ 2/9 Scores 07/05/2017 08/10/2016 02/09/2016 08/06/2014  PHQ - 2 Score 0 0 0 0    Fall Risk Fall Risk  07/05/2017 08/10/2016 02/09/2016 08/06/2014  Falls in the past year? No No  No No    Is the patient's home free of loose throw rugs in walkways, pet beds, electrical cords, etc?   yes      Grab bars in the bathroom? yes      Handrails on the stairs?   yes      Adequate lighting?   yes  Timed Get Up and Go Performed N/A  Cognitive Function:     6CIT Screen 07/05/2017  What Year? 0 points  What month? 0 points  What time? 0 points  Count back from 20 0 points  Months in reverse 0 points  Repeat phrase 0 points  Total Score 0    Screening Tests Health Maintenance  Topic Date Due  . PNA vac Low Risk Adult (2 of 2 - PPSV23) 08/10/2017  . INFLUENZA VACCINE  08/30/2017  . MAMMOGRAM  08/16/2018  . TETANUS/TDAP  03/30/2020  . COLONOSCOPY  01/03/2027  . DEXA SCAN  Completed  . Hepatitis C Screening  Completed    Qualifies for Shingles Vaccine? Up to date.  Cancer Screenings: Lung: Low Dose CT Chest recommended if Age 52-80 years, 30 pack-year currently smoking OR have quit w/in 15years. Patient does not qualify. Breast: Up to date on Mammogram? Yes   Up to date of Bone Density/Dexa? Yes Colorectal: Up to date  Additional Screenings:  Hepatitis C Screening: Up to date     Plan:  I have personally reviewed and addressed the Medicare Annual Wellness questionnaire and have  noted the following in the patient's chart:  A. Medical and social history B. Use of alcohol, tobacco or illicit drugs  C. Current medications and supplements D. Functional ability and status E.  Nutritional status F.  Physical activity G. Advance directives H. List of other physicians I.  Hospitalizations, surgeries, and ER visits in previous 12 months J.  [place] such as hearing and vision if needed, cognitive and depression L. Referrals and appointments - none  In addition, I have reviewed and discussed with patient certain preventive protocols, quality metrics, and best practice recommendations. A written personalized care plan for preventive services as well as general preventive health recommendations were provided to patient.  See attached scanned questionnaire for additional information.   Signed,  Fabio Neighbors, LPN Nurse Health Advisor   Nurse Recommendations: None.

## 2017-08-14 ENCOUNTER — Telehealth: Payer: Self-pay

## 2017-08-14 DIAGNOSIS — E785 Hyperlipidemia, unspecified: Secondary | ICD-10-CM

## 2017-08-14 NOTE — Telephone Encounter (Signed)
-----   Message from Osvaldo Shipper, [place] sent at 08/14/2017  2:09 PM EDT ----- Regarding: CT CA Score  Pam can you put in an order for a CT CA Score for a PT of Dr Ubaldo Glassing.   Name Erica Pineda  DX hyperlipidemia  Dr Ubaldo Glassing will call results   Thank you  Erline Levine

## 2017-08-21 ENCOUNTER — Encounter: Payer: Self-pay | Admitting: Physician Assistant

## 2017-08-21 ENCOUNTER — Ambulatory Visit (INDEPENDENT_AMBULATORY_CARE_PROVIDER_SITE_OTHER): Payer: Medicare Other | Admitting: Physician Assistant

## 2017-08-21 VITALS — BP 130/80 | HR 89 | Temp 97.6°F | Resp 16 | Ht 67.0 in | Wt 219.4 lb

## 2017-08-21 DIAGNOSIS — Z1231 Encounter for screening mammogram for malignant neoplasm of breast: Secondary | ICD-10-CM | POA: Diagnosis not present

## 2017-08-21 DIAGNOSIS — C73 Malignant neoplasm of thyroid gland: Secondary | ICD-10-CM

## 2017-08-21 DIAGNOSIS — R739 Hyperglycemia, unspecified: Secondary | ICD-10-CM

## 2017-08-21 DIAGNOSIS — Z23 Encounter for immunization: Secondary | ICD-10-CM

## 2017-08-21 DIAGNOSIS — E7800 Pure hypercholesterolemia, unspecified: Secondary | ICD-10-CM

## 2017-08-21 DIAGNOSIS — Z803 Family history of malignant neoplasm of breast: Secondary | ICD-10-CM | POA: Diagnosis not present

## 2017-08-21 DIAGNOSIS — Z1239 Encounter for other screening for malignant neoplasm of breast: Secondary | ICD-10-CM

## 2017-08-21 DIAGNOSIS — Z0000 Encounter for general adult medical examination without abnormal findings: Secondary | ICD-10-CM | POA: Diagnosis not present

## 2017-08-21 NOTE — Patient Instructions (Signed)
Shoulder Impingement Syndrome Shoulder impingement syndrome is a condition that causes pain when connective tissues (tendons) surrounding the shoulder joint become pinched. These tendons are part of the group of muscles and tissues that help to stabilize the shoulder (rotator cuff). Beneath the rotator cuff is a fluid-filled sac (bursa) that allows the muscles and tendons to glide smoothly. The bursa may become swollen or irritated (bursitis). Bursitis, swelling in the rotator cuff tendons, or both conditions can decrease how much space is under a bone in the shoulder joint (acromion), resulting in impingement. What are the causes? Shoulder impingement syndrome can be caused by bursitis or swelling of the rotator cuff tendons, which may result from:  Repetitive overhead arm movements.  Falling onto the shoulder.  Weakness in the shoulder muscles.  What increases the risk? You may be more likely to develop this condition if you are an athlete who participates in:  Sports that involve throwing, such as baseball.  Tennis.  Swimming.  Volleyball.  Some people are also more likely to develop impingement syndrome because of the shape of their acromion bone. What are the signs or symptoms? The main symptom of this condition is pain on the front or side of the shoulder. Pain may:  Get worse when lifting or raising the arm.  Get worse at night.  Wake you up from sleeping.  Feel sharp when the shoulder is moved, and then fade to an ache.  Other signs and symptoms may include:  Tenderness.  Stiffness.  Inability to raise the arm above shoulder level or behind the body.  Weakness.  How is this diagnosed? This condition may be diagnosed based on:  Your symptoms.  Your medical history.  A physical exam.  Imaging tests, such as: ? X-rays. ? MRI. ? Ultrasound.  How is this treated? Treatment for this condition may include:  Resting your shoulder and avoiding all  activities that cause pain or put stress on the shoulder.  Icing your shoulder.  NSAIDs to help reduce pain and swelling.  One or more injections of medicines to numb the area and reduce inflammation.  Physical therapy.  Surgery. This may be needed if nonsurgical treatments have not helped. Surgery may involve repairing the rotator cuff, reshaping the acromion, or removing the bursa.  Follow these instructions at home: Managing pain, stiffness, and swelling  If directed, apply ice to the injured area. ? Put ice in a plastic bag. ? Place a towel between your skin and the bag. ? Leave the ice on for 20 minutes, 2-3 times a day. Activity  Rest and return to your normal activities as told by your health care provider. Ask your health care provider what activities are safe for you.  Do exercises as told by your health care provider. General instructions  Do not use any tobacco products, including cigarettes, chewing tobacco, or e-cigarettes. Tobacco can delay healing. If you need help quitting, ask your health care provider.  Ask your health care provider when it is safe for you to drive.  Take over-the-counter and prescription medicines only as told by your health care provider.  Keep all follow-up visits as told by your health care provider. This is important. How is this prevented?  Give your body time to rest between periods of activity.  Be safe and responsible while being active to avoid falls.  Maintain physical fitness, including strength and flexibility. Contact a health care provider if:  Your symptoms have not improved after 1-2 months of treatment and   rest.  You cannot lift your arm away from your body. This information is not intended to replace advice given to you by your health care provider. Make sure you discuss any questions you have with your health care provider. Document Released: 01/16/2005 Document Revised: 09/23/2015 Document Reviewed:  12/19/2014 Elsevier Interactive Patient Education  2018 Elsevier Inc.  

## 2017-08-21 NOTE — Progress Notes (Signed)
Patient: Erica Pineda, Female    DOB: 07-Aug-1950, 66 y.o.   MRN: [id number] Visit Date: 08/21/2017  Today's Provider: Mar Daring, PA-C   Chief Complaint  Patient presents with  . Annual Exam   Subjective:     Complete Physical Erica Pineda is a 67 y.o. female. She feels fairly well. She reports exercising. She reports she is sleeping fairly well.  AWV with [facility] 07/05/17 [id number]  Reports on July 30, 2017 her 35 yr old son was diagnosed with stage 4 lung cancer. He is doing well and is a good candidate for the newer immunotherapy treatments. She reports initially she would just cry when she thought about it but is feeling much better now. Still gets emotional from time to time but not as often.  -----------------------------------------------------------   Review of Systems  Constitutional: Negative.   HENT: Negative.   Eyes: Positive for photophobia.  Respiratory: Negative.   Cardiovascular: Negative.   Gastrointestinal: Negative.   Endocrine: Negative.   Genitourinary: Negative.   Musculoskeletal: Positive for arthralgias ("Left Pain shoulder).       Right and Left "thumb Pad"  Skin: Positive for rash ("Belly").  Allergic/Immunologic: Positive for environmental allergies.  Neurological: Negative.   Hematological: Negative.   Psychiatric/Behavioral: Negative.     Social History   Socioeconomic History  . Marital status: Married    Spouse name: Annie Main  . Number of children: 3  . Years of education: 72  . Highest education level: Master's degree (e.g., MA, MS, MEng, MEd, MSW, MBA)  Occupational History  . 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 Financial resource strain: Not hard at all  . Food insecurity:    Worry: Never true    Inability: Never true  . Transportation needs:    Medical: No    Non-medical: No  Tobacco Use  . Smoking status: Never Smoker  . Smokeless  tobacco: Never Used  Substance and Sexual Activity  . Alcohol use: Yes    Comment: occasional  . Drug use: No  . Sexual activity: Not on file  Lifestyle  . Physical activity:    Days per week: Not on file    Minutes per session: Not on file  . Stress: Not at all  Relationships  . Social connections:    Talks on phone: Not on file    Gets together: Not on file    Attends religious service: Not on file    Active member of club or organization: Not on file    Attends meetings of clubs or organizations: Not on file    Relationship status: Not on file  . Intimate partner violence:    Fear of current or ex partner: Not on file    Emotionally abused: Not on file    Physically abused: Not on file    Forced sexual activity: Not on file  Other Topics Concern  . Not on file  Social History Narrative  . Not on file    Past Medical History:  Diagnosis Date  . Cancer ([place])    Thyroid-follwed by Endocrine     Patient Active Problem List   Diagnosis Date Noted  . Family history of breast cancer 08/10/2016  . BMI 33.0-33.9,adult 08/10/2016  . Lipoma of neck 11/25/2015  . Lipoma 06/30/2015  . Chronic frontal sinusitis 06/11/2014  . Blood glucose elevated 06/11/2014  .  Adult hypothyroidism 06/11/2014  . Avitaminosis D 06/11/2014  . 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 Dysphonia 04/07/2013  . Cancer of thyroid ([place]) 03/18/2009  . Hay fever 05/07/2006  . Hypercholesteremia 05/07/2006    Past Surgical History:  Procedure Laterality Date  . ABDOMINAL HYSTERECTOMY    . APPENDECTOMY  1996  . BUNIONECTOMY  05/2007   and hammer-toe  . CYSTOCELE REPAIR  1989  . HERNIA REPAIR Left 1993  . HYMENECTOMY  1971  . TONSILLECTOMY AND ADENOIDECTOMY  1972  . TOTAL THYROIDECTOMY  02/08/2009    Her family history includes Alzheimer's disease in her father and other; Breast cancer in her maternal aunt and sister; Diabetes in her father, other, sister, and sister; Heart disease in her other;  Hyperlipidemia in her other; Hypertension in her other; Osteoporosis in her mother; Stroke in her mother.      Current Outpatient Medications:  .  Cetirizine HCl 10 MG CAPS, Take 1 capsule by mouth as needed. Reported on 06/30/2015, Disp: , Rfl:  .  Cholecalciferol (VITAMIN D3) 1000 units CAPS, Take by mouth., Disp: , Rfl:  .  econazole nitrate 1 % cream, Apply 1 application topically 2 (two) times daily., Disp: , Rfl:  .  fluticasone (VERAMYST) 27.5 MCG/SPRAY nasal spray, Place 2 sprays into the nose as needed. Reported on 08/09/2015, Disp: , Rfl:  .  levothyroxine (SYNTHROID, LEVOTHROID) 137 MCG tablet, TAKE 1 TABLET (137 MCG TOTAL) BY MOUTH DAILY BEFORE BREAKFAST. (Patient taking differently: TAKE 1 TABLET (137 MCG TOTAL) BY MOUTH DAILY BEFORE BREAKFAST.), Disp: 90 tablet, Rfl: 1 .  naproxen sodium (ANAPROX) 220 MG tablet, Take 440 mg by mouth 2 (two) times daily with a meal., Disp: , Rfl:  .  Vitamin D, Ergocalciferol, (DRISDOL) 50000 units CAPS capsule, TAKE 1 CAPSULE (50,000 UNITS TOTAL) BY MOUTH EVERY 7 (SEVEN) DAYS., Disp: 12 capsule, Rfl: 3 .  MULTIPLE VITAMINS-MINERALS PO, Take by mouth daily. , Disp: , Rfl:  .  OMEGA-3 FATTY ACIDS PO, Take by mouth., Disp: , Rfl:   Patient Care Team: Mar Daring, PA-C as PCP - General (Family Medicine) Beverly Gust, MD as Consulting Physician (Otolaryngology) Lanier Clam, MD as Consulting Physician (Internal Medicine)     Objective:   Vitals: BP 130/80 (BP Location: Left Arm, Patient Position: Sitting, Cuff Size: Normal)   Pulse 89   Temp 97.6 F (36.4 C) (Oral)   Resp 16   Ht 5\' 7"  (1.702 m)   Wt 219 lb 6.4 oz (99.5 kg)   BMI 34.36 kg/m   Physical Exam  Constitutional: She is oriented to person, place, and time. She appears well-developed and well-nourished. No distress.  HENT:  Head: Normocephalic and atraumatic.  Right Ear: Hearing, tympanic membrane, external ear and ear canal normal.  Left Ear: Hearing, tympanic  membrane, external ear and ear canal normal.  Nose: Nose normal.  Mouth/Throat: Uvula is midline, oropharynx is clear and moist and mucous membranes are normal. No oropharyngeal exudate.  Eyes: Pupils are equal, round, and reactive to light. Conjunctivae and EOM are normal. Right eye exhibits no discharge. Left eye exhibits no discharge. No scleral icterus.  Neck: Normal range of motion. Neck supple. No JVD present. Carotid bruit is not present. No tracheal deviation present. No thyromegaly present.  Cardiovascular: Normal rate, regular rhythm, normal heart sounds and intact distal pulses. Exam reveals no gallop and no friction rub.  No murmur heard. Pulmonary/Chest: Effort normal and breath sounds normal. No respiratory distress. She has no wheezes. She  has no rales. She exhibits no tenderness.  Abdominal: Soft. Bowel sounds are normal. She exhibits no distension and no mass. There is no tenderness. There is no rebound and no guarding.  Musculoskeletal: Normal range of motion. She exhibits no edema or tenderness.  Lymphadenopathy:    She has no cervical adenopathy.  Neurological: She is alert and oriented to person, place, and time.  Skin: Skin is warm and dry. No rash noted. She is not diaphoretic.  Psychiatric: She has a normal mood and affect. Her behavior is normal. Judgment and thought content normal.  Vitals reviewed.   Activities of Daily Living In your present state of health, do you have any difficulty performing the following activities: 07/05/2017  Hearing? N  Vision? N  Difficulty concentrating or making decisions? N  Walking or climbing stairs? N  Dressing or bathing? N  Doing errands, shopping? N  Preparing Food and eating ? N  Using the Toilet? N  In the past six months, have you accidently leaked urine? Y  Comment Occasionally, had bladder tacted with hysterectomy  Do you have problems with loss of bowel control? N  Managing your Medications? N  Managing your Finances?  N  Housekeeping or managing your Housekeeping? N  Some recent data might be hidden    Fall Risk Assessment Fall Risk  07/05/2017 08/10/2016 02/09/2016 08/06/2014  Falls in the past year? No No No No     Depression Screen PHQ 2/9 Scores 07/05/2017 08/10/2016 02/09/2016 08/06/2014  PHQ - 2 Score 0 0 0 0   6CIT Screen 07/05/2017  What Year? 0 points  What month? 0 points  What time? 0 points  Count back from 20 0 points  Months in reverse 0 points  Repeat phrase 0 points  Total Score 0        Assessment & Plan:    Annual Physical Reviewed patient's Family Medical History Reviewed and updated list of patient's medical providers Assessment of cognitive impairment was done Assessed patient's functional ability Established a written schedule for health screening [place] Completed and Reviewed  Exercise Activities and Dietary recommendations Goals    . Reduce portion size     Continue to eat healthy. Continue Weight Watchers.         Immunization History  Administered Date(s) Administered  . Influenza,inj,Quad PF,6+ Mos 12/16/2012  . Pneumococcal Conjugate-13 08/10/2016  . Tdap 03/31/2010  . Zoster Recombinat (Shingrix) 08/10/2016, 10/10/2016    Health Maintenance  Topic Date Due  . PNA vac Low Risk Adult (2 of 2 - PPSV23) 08/10/2017  . INFLUENZA VACCINE  08/30/2017  . MAMMOGRAM  08/16/2018  . TETANUS/TDAP  03/30/2020  . COLONOSCOPY  01/03/2027  . DEXA SCAN  Completed  . Hepatitis C Screening  Completed     Discussed health benefits of physical activity, and encouraged her to engage in regular exercise appropriate for her age and condition.    1. Annual physical exam Normal physical exam today. Will check labs as below and f/u pending lab results. If labs are stable and WNL she will not need to have these rechecked for one year at her next annual physical exam. She is to call the office in the meantime if she has any acute issue, questions or  concerns. - CBC with Differential/Platelet - Comprehensive metabolic panel - Hemoglobin A1c - Lipid panel  2. Breast cancer screening Has mammograms done through [facility]. Advised patient to call.   3. Cancer of thyroid ([place]) Followed by  Dr. Dara Lords, [facility].   4. Family history of breast cancer  5. Hypercholesteremia Stable. Diet controlled. Will check labs as below and f/u pending results. - Hemoglobin A1c - Lipid panel  6. Blood glucose elevated Diet controlled. Will check labs as below and f/u pending results. - Comprehensive metabolic panel - Hemoglobin A1c - Lipid panel  7. Need for pneumococcal vaccination Pneumococcal 23 Vaccine given to patient without complications. Patient sat for 15 minutes after administration and was tolerated well without adverse effects. - Pneumococcal polysaccharide vaccine 23-valent greater than or equal to 2yo subcutaneous/IM  ------------------------------------------------------------------------------------------------------------    Mar Daring, PA-C  [facility]

## 2017-08-24 ENCOUNTER — Ambulatory Visit (INDEPENDENT_AMBULATORY_CARE_PROVIDER_SITE_OTHER)
Admission: RE | Admit: 2017-08-24 | Discharge: 2017-08-24 | Disposition: A | Discharge status: Home / Self Care | Payer: Medicare Other | Source: Ambulatory Visit | Attending: Cardiovascular Disease | Admitting: Cardiovascular Disease

## 2017-08-24 ENCOUNTER — Telehealth: Payer: Self-pay

## 2017-08-24 DIAGNOSIS — E785 Hyperlipidemia, unspecified: Secondary | ICD-10-CM

## 2017-08-24 LAB — HM MAMMOGRAPHY

## 2017-08-24 NOTE — Telephone Encounter (Signed)
Patient was advised as directed below. Reports that she is taking the RX strength Vit D but she has not been doing the supplement everyday and reports she prefers to start doing that first.  Thanks,  -Joseline

## 2017-08-24 NOTE — Telephone Encounter (Signed)
Patient called to let Tawanna Sat know that she did go to [facility] to have labs done. She wanted her to review to make sure that everything was ok. Please let her know if she received records. Contact info is correct. Thanks!

## 2017-08-24 NOTE — Telephone Encounter (Signed)
Labs reviewed. Cholesterol still elevated but stable. Sugar normal. Kidney function normal. Liver normal. Blood count normal. Vit D borderline low at 25. I know she is taking an OTC supplement, so since still low see if she wants to start back the Rx strength Vit D.

## 2017-08-31 ENCOUNTER — Encounter: Payer: Self-pay | Admitting: Physician Assistant

## 2017-08-31 ENCOUNTER — Telehealth: Payer: Self-pay

## 2017-08-31 NOTE — Telephone Encounter (Signed)
Patient advised that her Mammogram result are Normal and repeat screening in one year. ([facility] report)

## 2018-01-11 ENCOUNTER — Encounter: Payer: Self-pay | Admitting: Physician Assistant

## 2018-01-11 ENCOUNTER — Ambulatory Visit: Payer: Medicare Other | Admitting: Physician Assistant

## 2018-01-11 VITALS — BP 130/85 | HR 81 | Temp 98.2°F | Resp 16 | Wt 230.0 lb

## 2018-01-11 DIAGNOSIS — E039 Hypothyroidism, unspecified: Secondary | ICD-10-CM | POA: Diagnosis not present

## 2018-01-11 DIAGNOSIS — C73 Malignant neoplasm of thyroid gland: Secondary | ICD-10-CM

## 2018-01-11 DIAGNOSIS — H8110 Benign paroxysmal vertigo, unspecified ear: Secondary | ICD-10-CM

## 2018-01-11 DIAGNOSIS — E7800 Pure hypercholesterolemia, unspecified: Secondary | ICD-10-CM

## 2018-01-11 DIAGNOSIS — R739 Hyperglycemia, unspecified: Secondary | ICD-10-CM

## 2018-01-11 DIAGNOSIS — G2581 Restless legs syndrome: Secondary | ICD-10-CM

## 2018-01-11 DIAGNOSIS — E559 Vitamin D deficiency, unspecified: Secondary | ICD-10-CM

## 2018-01-11 NOTE — Progress Notes (Signed)
Patient: Erica Pineda Female    DOB: [birth year]   67 y.o.   MRN: [id number] Visit Date: 01/11/2018  Today's Provider: Mar Daring, PA-C   Chief Complaint  Patient presents with  . Leg Pain   Subjective:     HPI  Patient here today C/O right leg pain "tightness" in leg and calf. Patient reports pain has been present for several weeks. Patient reports pain is there day and night. Patient reports pain is more bothersome at rest. Patient denies ant swelling or injuries. Patient reports that 12/03/17 she got a cortisone injection for right knee pain but pain never completely healed. Patient reports taking Aleve for leg pain and knee pain, patient reports good pain control.   Patient c/o light headedness on and off x's 1-2 weeks. Patient reports symptoms are random. Patient reports that she has gained some of weight back from being on Keto diet. Patient not sure if weight gain is related to dizziness. Patient would like to have lab work done. Patient reports that dizziness lasts about 5 seconds.   Allergies  Allergen Reactions  . Percodan  [Oxycodone-Aspirin]   . Simvastatin Swelling    Other reaction(s): Muscle Pain     Current Outpatient Medications:  .  Cetirizine HCl 10 MG CAPS, Take 1 capsule by mouth as needed. Reported on 06/30/2015, Disp: , Rfl:  .  Cholecalciferol (VITAMIN D3) 1000 units CAPS, Take by mouth., Disp: , Rfl:  .  econazole nitrate 1 % cream, Apply 1 application topically 2 (two) times daily., Disp: , Rfl:  .  fluticasone (VERAMYST) 27.5 MCG/SPRAY nasal spray, Place 2 sprays into the nose as needed. Reported on 08/09/2015, Disp: , Rfl:  .  levothyroxine (SYNTHROID, LEVOTHROID) 137 MCG tablet, TAKE 1 TABLET (137 MCG TOTAL) BY MOUTH DAILY BEFORE BREAKFAST. (Patient taking differently: TAKE 1 TABLET (137 MCG TOTAL) BY MOUTH DAILY BEFORE BREAKFAST.), Disp: 90 tablet, Rfl: 1 .  naproxen sodium (ANAPROX) 220 MG tablet, Take 440 mg by mouth 2 (two) times daily with a  meal., Disp: , Rfl:  .  Vitamin D, Ergocalciferol, (DRISDOL) 50000 units CAPS capsule, TAKE 1 CAPSULE (50,000 UNITS TOTAL) BY MOUTH EVERY 7 (SEVEN) DAYS., Disp: 12 capsule, Rfl: 3 .  MULTIPLE VITAMINS-MINERALS PO, Take by mouth daily. , Disp: , Rfl:  .  OMEGA-3 FATTY ACIDS PO, Take by mouth., Disp: , Rfl:   Review of Systems  Constitutional: Negative.   Respiratory: Negative.   Cardiovascular: Negative.   Gastrointestinal: Negative.   Musculoskeletal: Positive for myalgias.  Neurological: Positive for dizziness and light-headedness.    Social History   Tobacco Use  . Smoking status: Never Smoker  . Smokeless tobacco: Never Used  Substance Use Topics  . Alcohol use: Yes    Comment: occasional      Objective:   BP 130/85 (BP Location: Left Arm, Patient Position: Sitting, Cuff Size: Normal)   Pulse 81   Temp 98.2 F (36.8 C) (Oral)   Resp 16   Wt 230 lb (104.3 kg)   BMI 36.02 kg/m  Vitals:   01/11/18 0953  BP: 130/85  Pulse: 81  Resp: 16  Temp: 98.2 F (36.8 C)  TempSrc: Oral  Weight: 230 lb (104.3 kg)     Physical Exam Vitals signs reviewed.  Constitutional:      General: She is not in acute distress.    Appearance: She is well-developed. She is obese. She is not diaphoretic.  HENT:  Head: Normocephalic and atraumatic.     Right Ear: Hearing, tympanic membrane, ear canal and external ear normal.     Left Ear: Hearing, tympanic membrane, ear canal and external ear normal.     Nose: Nose normal.     Mouth/Throat:     Pharynx: Uvula midline. No oropharyngeal exudate.  Eyes:     General: No scleral icterus.       Right eye: No discharge.        Left eye: No discharge.     Conjunctiva/sclera: Conjunctivae normal.     Pupils: Pupils are equal, round, and reactive to light.  Neck:     Musculoskeletal: Normal range of motion and neck supple.     Thyroid: No thyromegaly.     Trachea: No tracheal deviation.  Cardiovascular:     Rate and Rhythm: Normal rate  and regular rhythm.     Pulses:          Dorsalis pedis pulses are 2+ on the right side and 2+ on the left side.       Posterior tibial pulses are 2+ on the right side and 2+ on the left side.     Heart sounds: Normal heart sounds. No murmur. No friction rub. No gallop.   Pulmonary:     Effort: Pulmonary effort is normal. No respiratory distress.     Breath sounds: Normal breath sounds. No stridor. No wheezing or rales.  Musculoskeletal: Normal range of motion.        General: No swelling.     Right knee: She exhibits normal range of motion and no swelling. Tenderness found. Medial joint line tenderness noted.     Right lower leg: No edema.     Left lower leg: No edema.  Lymphadenopathy:     Cervical: No cervical adenopathy.  Skin:    General: Skin is warm and dry.        Assessment & Plan    1. Restless leg syndrome Symptoms of tightness and tingling, "need to move" are most likely new RLS. Previously had used magnesium with relief of symptoms but is not taking magnesium. Will restart. She is to call if no improvement in symptoms and may consider Requip. She is in agreement. Will check labs as below to r/o other vitamin deficiencies that cause similar symptoms.  - CBC w/Diff/Platelet - Comprehensive Metabolic Panel (CMET) - TSH - Lipid Profile - Vitamin D (25 hydroxy) - B12 - HgB A1c  2. Benign paroxysmal positional vertigo, unspecified laterality Benign exam today. Suspect BPPV. Demonstrated brandt-Daroff exercises. Call if worsening.  - CBC w/Diff/Platelet - Comprehensive Metabolic Panel (CMET) - TSH - Lipid Profile - Vitamin D (25 hydroxy) - B12 - HgB A1c  3. Adult hypothyroidism Stable. Will check labs as below and f/u pending results. - CBC w/Diff/Platelet - Comprehensive Metabolic Panel (CMET) - TSH - Lipid Profile - Vitamin D (25 hydroxy) - B12 - HgB A1c  4. Cancer of thyroid [facility]) See above medical treatment plan. - CBC w/Diff/Platelet - Comprehensive  Metabolic Panel (CMET) - TSH - Lipid Profile - Vitamin D (25 hydroxy) - B12 - HgB A1c  5. Avitaminosis D H/O this and with RLS type symptoms. Will check labs as below and f/u pending results. - CBC w/Diff/Platelet - Comprehensive Metabolic Panel (CMET) - TSH - Lipid Profile - Vitamin D (25 hydroxy) - B12 - HgB A1c  6. Blood glucose elevated Diet controlled. Family history of diabetes. Will check labs as below and f/u  pending results. - CBC w/Diff/Platelet - Comprehensive Metabolic Panel (CMET) - TSH - Lipid Profile - Vitamin D (25 hydroxy) - B12 - HgB A1c  7. Hypercholesteremia Stable. Will check labs as below and f/u pending results. - CBC w/Diff/Platelet - Comprehensive Metabolic Panel (CMET) - TSH - Lipid Profile - Vitamin D (25 hydroxy) - B12 - HgB A1c     Mar Daring, PA-C  [facility]

## 2018-01-11 NOTE — Patient Instructions (Signed)
Restless Legs Syndrome Restless legs syndrome is a condition that causes uncomfortable feelings or sensations in the legs, especially while sitting or lying down. The sensations usually cause an overwhelming urge to move the legs. The arms can also sometimes be affected. The condition can range from mild to severe. The symptoms often interfere with a person's ability to sleep. What are the causes? The cause of this condition is not known. What increases the risk? This condition is more likely to develop in:  People who are older than age 11.  Pregnant women. In general, restless legs syndrome is more common in women than in men.  People who have a family history of the condition.  People who have certain medical conditions, such as iron deficiency, kidney disease, Parkinson disease, or nerve damage.  People who take certain medicines, such as medicines for high blood pressure, nausea, colds, allergies, depression, and some heart conditions.  What are the signs or symptoms? The main symptom of this condition is uncomfortable sensations in the legs. These sensations may be:  Described as pulling, tingling, prickling, throbbing, crawling, or burning.  Worse while you are sitting or lying down.  Worse during periods of rest or inactivity.  Worse at night, often interfering with your sleep.  Accompanied by a very strong urge to move your legs.  Temporarily relieved by movement of your legs.  The sensations usually affect both sides of the body. The arms can also be affected, but this is rare. People who have this condition often have tiredness during the day because of their lack of sleep at night. How is this diagnosed? This condition may be diagnosed based on your description of the symptoms. You may also have tests, including blood tests, to check for other conditions that may lead to your symptoms. In some cases, you may be asked to spend some time in a sleep lab so your sleeping  can be monitored. How is this treated? Treatment for this condition is focused on managing the symptoms. Treatment may include:  Self-help and lifestyle changes.  Medicines.  Follow these instructions at home:  Take medicines only as directed by your health care provider.  Try these methods to get temporary relief from the uncomfortable sensations: ? Massage your legs. ? Walk or stretch. ? Take a cold or hot bath.  Practice good sleep habits. For example, go to bed and get up at the same time every day.  Exercise regularly.  Practice ways of relaxing, such as yoga or meditation.  Avoid caffeine and alcohol.  Do not use any tobacco products, including cigarettes, chewing tobacco, or electronic cigarettes. If you need help quitting, ask your health care provider.  Keep all follow-up visits as directed by your health care provider. This is important. Contact a health care provider if: Your symptoms do not improve with treatment, or they get worse. This information is not intended to replace advice given to you by your health care provider. Make sure you discuss any questions you have with your health care provider. Document Released: 01/06/2002 Document Revised: 06/24/2015 Document Reviewed: 01/12/2014 Elsevier Interactive Patient Education  2018 [employer]. Benign Positional Vertigo Vertigo is the feeling that you or your surroundings are moving when they are not. Benign positional vertigo is the most common form of vertigo. The cause of this condition is not serious (is benign). This condition is triggered by certain movements and positions (is positional). This condition can be dangerous if it occurs while you are doing something  that could endanger you or others, such as driving. What are the causes? In many cases, the cause of this condition is not known. It may be caused by a disturbance in an area of the inner ear that helps your brain to sense movement and balance. This  disturbance can be caused by a viral infection (labyrinthitis), head injury, or repetitive motion. What increases the risk? This condition is more likely to develop in:  Women.  People who are 64 years of age or older.  What are the signs or symptoms? Symptoms of this condition usually happen when you move your head or your eyes in different directions. Symptoms may start suddenly, and they usually last for less than a minute. Symptoms may include:  Loss of balance and falling.  Feeling like you are spinning or moving.  Feeling like your surroundings are spinning or moving.  Nausea and vomiting.  Blurred vision.  Dizziness.  Involuntary eye movement (nystagmus).  Symptoms can be mild and cause only slight annoyance, or they can be severe and interfere with daily life. Episodes of benign positional vertigo may return (recur) over time, and they may be triggered by certain movements. Symptoms may improve over time. How is this diagnosed? This condition is usually diagnosed by medical history and a physical exam of the head, neck, and ears. You may be referred to a health care provider who specializes in ear, nose, and throat (ENT) problems (otolaryngologist) or a provider who specializes in disorders of the nervous system (neurologist). You may have additional testing, including:  MRI.  A CT scan.  Eye movement tests. Your health care provider may ask you to change positions quickly while he or she watches you for symptoms of benign positional vertigo, such as nystagmus. Eye movement may be tested with an electronystagmogram (ENG), caloric stimulation, the Dix-Hallpike test, or the roll test.  An electroencephalogram (EEG). This records electrical activity in your brain.  Hearing tests.  How is this treated? Usually, your health care provider will treat this by moving your head in specific positions to adjust your inner ear back to normal. Surgery may be needed in severe cases,  but this is rare. In some cases, benign positional vertigo may resolve on its own in 2-4 weeks. Follow these instructions at home: Safety  Move slowly.Avoid sudden body or head movements.  Avoid driving.  Avoid operating heavy machinery.  Avoid doing any tasks that would be dangerous to you or others if a vertigo episode would occur.  If you have trouble walking or keeping your balance, try using a cane for stability. If you feel dizzy or unstable, sit down right away.  Return to your normal activities as told by your health care provider. Ask your health care provider what activities are safe for you. General instructions  Take over-the-counter and prescription medicines only as told by your health care provider.  Avoid certain positions or movements as told by your health care provider.  Drink enough fluid to keep your urine clear or pale yellow.  Keep all follow-up visits as told by your health care provider. This is important. Contact a health care provider if:  You have a fever.  Your condition gets worse or you develop new symptoms.  Your family or friends notice any behavioral changes.  Your nausea or vomiting gets worse.  You have numbness or a "pins and needles" sensation. Get help right away if:  You have difficulty speaking or moving.  You are always dizzy.  You faint.  You develop severe headaches.  You have weakness in your legs or arms.  You have changes in your hearing or vision.  You develop a stiff neck.  You develop sensitivity to light. This information is not intended to replace advice given to you by your health care provider. Make sure you discuss any questions you have with your health care provider. Document Released: 10/24/2005 Document Revised: 06/24/2015 Document Reviewed: 05/11/2014 Elsevier Interactive Patient Education  [employer].

## 2018-01-12 LAB — CBC WITH DIFFERENTIAL/PLATELET
BASOS ABS: 0.1 10*3/uL (ref 0.0–0.2)
Basos: 1 %
EOS (ABSOLUTE): 0.2 10*3/uL (ref 0.0–0.4)
Eos: 5 %
Hematocrit: 42.8 % (ref 34.0–46.6)
Hemoglobin: 14 g/dL (ref 11.1–15.9)
Immature Grans (Abs): 0 10*3/uL (ref 0.0–0.1)
Immature Granulocytes: 0 %
LYMPHS ABS: 1.6 10*3/uL (ref 0.7–3.1)
Lymphs: 33 %
MCH: 28.3 pg (ref 26.6–33.0)
MCHC: 32.7 g/dL (ref 31.5–35.7)
MCV: 87 fL (ref 79–97)
MONOCYTES: 9 %
MONOS ABS: 0.4 10*3/uL (ref 0.1–0.9)
Neutrophils Absolute: 2.5 10*3/uL (ref 1.4–7.0)
Neutrophils: 52 %
Platelets: 319 10*3/uL (ref 150–450)
RBC: 4.95 x10E6/uL (ref 3.77–5.28)
RDW: 13 % (ref 12.3–15.4)
WBC: 4.7 10*3/uL (ref 3.4–10.8)

## 2018-01-12 LAB — COMPREHENSIVE METABOLIC PANEL
ALK PHOS: 63 IU/L (ref 39–117)
ALT: 15 IU/L (ref 0–32)
AST: 15 IU/L (ref 0–40)
Albumin/Globulin Ratio: 2.1 (ref 1.2–2.2)
Albumin: 4.1 g/dL (ref 3.6–4.8)
BUN/Creatinine Ratio: 28 (ref 12–28)
BUN: 18 mg/dL (ref 8–27)
Bilirubin Total: 0.3 mg/dL (ref 0.0–1.2)
CHLORIDE: 105 mmol/L (ref 96–106)
CO2: 23 mmol/L (ref 20–29)
CREATININE: 0.65 mg/dL (ref 0.57–1.00)
Calcium: 9.3 mg/dL (ref 8.7–10.3)
GFR calc Af Amer: 107 mL/min/{1.73_m2} (ref >59)
GFR calc non Af Amer: 93 mL/min/{1.73_m2} (ref >59)
GLUCOSE: 89 mg/dL (ref 65–99)
Globulin, Total: 2 g/dL (ref 1.5–4.5)
Potassium: 4.8 mmol/L (ref 3.5–5.2)
Sodium: 140 mmol/L (ref 134–144)
Total Protein: 6.1 g/dL (ref 6.0–8.5)

## 2018-01-12 LAB — LIPID PANEL
CHOLESTEROL TOTAL: 268 mg/dL — AB (ref 100–199)
Chol/HDL Ratio: 4.6 ratio — ABNORMAL HIGH (ref 0.0–4.4)
HDL: 58 mg/dL (ref >39)
LDL Calculated: 180 mg/dL — ABNORMAL HIGH (ref 0–99)
Triglycerides: 149 mg/dL (ref 0–149)
VLDL CHOLESTEROL CAL: 30 mg/dL (ref 5–40)

## 2018-01-12 LAB — TSH: TSH: 0.545 u[IU]/mL (ref 0.450–4.500)

## 2018-01-12 LAB — VITAMIN D 25 HYDROXY (VIT D DEFICIENCY, FRACTURES): Vit D, 25-Hydroxy: 25.1 ng/mL — ABNORMAL LOW (ref 30.0–100.0)

## 2018-01-12 LAB — HEMOGLOBIN A1C
Est. average glucose Bld gHb Est-mCnc: 128 mg/dL
HEMOGLOBIN A1C: 6.1 % — AB (ref 4.8–5.6)

## 2018-01-12 LAB — VITAMIN B12: VITAMIN B 12: 671 pg/mL (ref 232–1245)

## 2018-01-13 ENCOUNTER — Other Ambulatory Visit: Payer: Self-pay | Admitting: Physician Assistant

## 2018-01-13 DIAGNOSIS — E559 Vitamin D deficiency, unspecified: Secondary | ICD-10-CM

## 2018-01-14 ENCOUNTER — Telehealth: Payer: Self-pay

## 2018-01-14 NOTE — Telephone Encounter (Signed)
Patient was advised.  

## 2018-01-14 NOTE — Telephone Encounter (Signed)
-----   Message from Mar Daring, [place] sent at 01/14/2018  8:05 AM EST ----- Blood count normal. Kidney and liver function normal. Thyroid normal. Cholesterol elevated but stable. Vit D remains low. Continue supplement. B12 normal. A1c increased from 5.5 to 6.1. Continue working on healthy lifestyle.

## 2018-07-08 ENCOUNTER — Other Ambulatory Visit: Payer: Self-pay

## 2018-07-08 ENCOUNTER — Ambulatory Visit (INDEPENDENT_AMBULATORY_CARE_PROVIDER_SITE_OTHER): Payer: Medicare Other

## 2018-07-08 DIAGNOSIS — Z0000 Encounter for general adult medical examination without abnormal findings: Secondary | ICD-10-CM

## 2018-07-08 NOTE — Patient Instructions (Addendum)
Erica Pineda , Thank you for taking time to come for your Medicare Wellness Visit. I appreciate your ongoing commitment to your health goals. Please review the following plan we discussed and let me know if I can assist you in the future.   Screening recommendations/referrals: Colonoscopy: Up to date, due 12/2026 Mammogram: Up to date, due 07/2019 Bone Density: Up to date, due 08/2021 Recommended yearly ophthalmology/optometry visit for glaucoma screening and checkup Recommended yearly dental visit for hygiene and checkup  Vaccinations: Influenza vaccine: Up to date, due fall 2020 Pneumococcal vaccine: Completed series Tdap vaccine: Up to date, due 03/2020 Shingles vaccine: Completed series    Advanced directives: Please bring a copy of your POA (Power of [place]) and/or Living Will to your next appointment.   Conditions/risks identified: Continue to work to maintain a healthy diet to help aid with weight loss.   Next appointment: 08/26/18 @ 9:00 AM with Fenton Malling.    Preventive Care 6 Years and Older, Female Preventive care refers to lifestyle choices and visits with your health care provider that can promote health and wellness. What does preventive care include?  A yearly physical exam. This is also called an annual well check.  Dental exams once or twice a year.  Routine eye exams. Ask your health care provider how often you should have your eyes checked.  Personal lifestyle choices, including:  Daily care of your teeth and gums.  Regular physical activity.  Eating a healthy diet.  Avoiding tobacco and drug use.  Limiting alcohol use.  Practicing safe sex.  Taking low-dose aspirin every day.  Taking vitamin and mineral supplements as recommended by your health care provider. What happens during an annual well check? The services and screenings done by your health care provider during your annual well check will depend on your age, overall health, lifestyle  risk factors, and family history of disease. Counseling  Your health care provider may ask you questions about your:  Alcohol use.  Tobacco use.  Drug use.  Emotional well-being.  Home and relationship well-being.  Sexual activity.  Eating habits.  History of falls.  Memory and ability to understand (cognition).  Work and work Statistician.  Reproductive health. Screening  You may have the following tests or measurements:  Height, weight, and BMI.  Blood pressure.  Lipid and cholesterol levels. These may be checked every 5 years, or more frequently if you are over 33 years old.  Skin check.  Lung cancer screening. You may have this screening every year starting at age 43 if you have a 30-pack-year history of smoking and currently smoke or have quit within the past 15 years.  Fecal occult blood test (FOBT) of the stool. You may have this test every year starting at age 81.  Flexible sigmoidoscopy or colonoscopy. You may have a sigmoidoscopy every 5 years or a colonoscopy every 10 years starting at age 24.  Hepatitis C blood test.  Hepatitis B blood test.  Sexually transmitted disease (STD) testing.  Diabetes screening. This is done by checking your blood sugar (glucose) after you have not eaten for a while (fasting). You may have this done every 1-3 years.  Bone density scan. This is done to screen for osteoporosis. You may have this done starting at age 34.  Mammogram. This may be done every 1-2 years. Talk to your health care provider about how often you should have regular mammograms. Talk with your health care provider about your test results, treatment options, and if  necessary, the need for more tests. Vaccines  Your health care provider may recommend certain vaccines, such as:  Influenza vaccine. This is recommended every year.  Tetanus, diphtheria, and acellular pertussis (Tdap, Td) vaccine. You may need a Td booster every 10 years.  Zoster vaccine.  You may need this after age 75.  Pneumococcal 13-valent conjugate (PCV13) vaccine. One dose is recommended after age 59.  Pneumococcal polysaccharide (PPSV23) vaccine. One dose is recommended after age 83. Talk to your health care provider about which screenings and vaccines you need and how often you need them. This information is not intended to replace advice given to you by your health care provider. Make sure you discuss any questions you have with your health care provider. Document Released: 02/12/2015 Document Revised: 10/06/2015 Document Reviewed: 11/17/2014 Elsevier Interactive Patient Education  2017 [place] Prevention in the Home Falls can cause injuries. They can happen to people of all ages. There are many things you can do to make your home safe and to help prevent falls. What can I do on the outside of my home?  Regularly fix the edges of walkways and driveways and fix any cracks.  Remove anything that might make you trip as you walk through a door, such as a raised step or threshold.  Trim any bushes or trees on the path to your home.  Use bright outdoor lighting.  Clear any walking paths of anything that might make someone trip, such as rocks or tools.  Regularly check to see if handrails are loose or broken. Make sure that both sides of any steps have handrails.  Any raised decks and porches should have guardrails on the edges.  Have any leaves, snow, or ice cleared regularly.  Use sand or salt on walking paths during winter.  Clean up any spills in your garage right away. This includes oil or grease spills. What can I do in the bathroom?  Use night lights.  Install grab bars by the toilet and in the tub and shower. Do not use towel bars as grab bars.  Use non-skid mats or decals in the tub or shower.  If you need to sit down in the shower, use a plastic, non-slip stool.  Keep the floor dry. Clean up any water that spills on the floor as soon  as it happens.  Remove soap buildup in the tub or shower regularly.  Attach bath mats securely with double-sided non-slip rug tape.  Do not have throw rugs and other things on the floor that can make you trip. What can I do in the bedroom?  Use night lights.  Make sure that you have a light by your bed that is easy to reach.  Do not use any sheets or blankets that are too big for your bed. They should not hang down onto the floor.  Have a firm chair that has side arms. You can use this for support while you get dressed.  Do not have throw rugs and other things on the floor that can make you trip. What can I do in the kitchen?  Clean up any spills right away.  Avoid walking on wet floors.  Keep items that you use a lot in easy-to-reach places.  If you need to reach something above you, use a strong step stool that has a grab bar.  Keep electrical cords out of the way.  Do not use floor polish or wax that makes floors slippery. If you must  use wax, use non-skid floor wax.  Do not have throw rugs and other things on the floor that can make you trip. What can I do with my stairs?  Do not leave any items on the stairs.  Make sure that there are handrails on both sides of the stairs and use them. Fix handrails that are broken or loose. Make sure that handrails are as long as the stairways.  Check any carpeting to make sure that it is firmly attached to the stairs. Fix any carpet that is loose or worn.  Avoid having throw rugs at the top or bottom of the stairs. If you do have throw rugs, attach them to the floor with carpet tape.  Make sure that you have a light switch at the top of the stairs and the bottom of the stairs. If you do not have them, ask someone to add them for you. What else can I do to help prevent falls?  Wear shoes that:  Do not have high heels.  Have rubber bottoms.  Are comfortable and fit you well.  Are closed at the toe. Do not wear sandals.  If  you use a stepladder:  Make sure that it is fully opened. Do not climb a closed stepladder.  Make sure that both sides of the stepladder are locked into place.  Ask someone to hold it for you, if possible.  Clearly mark and make sure that you can see:  Any grab bars or handrails.  First and last steps.  Where the edge of each step is.  Use tools that help you move around (mobility aids) if they are needed. These include:  Canes.  Walkers.  Scooters.  Crutches.  Turn on the lights when you go into a dark area. Replace any light bulbs as soon as they burn out.  Set up your furniture so you have a clear path. Avoid moving your furniture around.  If any of your floors are uneven, fix them.  If there are any pets around you, be aware of where they are.  Review your medicines with your doctor. Some medicines can make you feel dizzy. This can increase your chance of falling. Ask your doctor what other things that you can do to help prevent falls. This information is not intended to replace advice given to you by your health care provider. Make sure you discuss any questions you have with your health care provider. Document Released: 11/12/2008 Document Revised: 06/24/2015 Document Reviewed: 02/20/2014 Elsevier Interactive Patient Education  2017 [employer].

## 2018-07-08 NOTE — Progress Notes (Signed)
Subjective:   Erica Pineda is a 68 y.o. female who presents for Medicare Annual (Subsequent) preventive examination.    This visit is being conducted through telemedicine due to the COVID-19 pandemic. This patient has given me verbal consent via doximity to conduct this visit, patient states they are participating from their home address. Some vital signs may be absent or patient reported.    Patient identification: identified by name, DOB, and current address  Review of Systems:  N/A  Cardiac Risk Factors include: advanced age (>47men, >74 women);dyslipidemia;obesity (BMI >30kg/m2)     Objective:     Vitals: There were no vitals taken for this visit.  There is no height or weight on file to calculate BMI. Unable to obtain vitals due to visit being conducted via telephonically.   Advanced Directives 07/08/2018 07/05/2017 08/10/2016 08/06/2014 08/06/2014  Does Patient Have a Medical Advance Directive? Yes Yes Yes - Yes  Type of Advance Directive [place];Living will [place];Living will Healthcare Power of [place] of [place] will  Copy of [place] in Chart? No - copy requested No - copy requested - - -    Tobacco Social History   Tobacco Use  Smoking Status Never Smoker  Smokeless Tobacco Never Used     Counseling given: Not Answered   Clinical Intake:  Pre-visit preparation completed: Yes  Pain : No/denies pain Pain Score: 0-No pain     Nutritional Status: BMI > 30  Obese Nutritional Risks: None Diabetes: No  How often do you need to have someone help you when you read instructions, pamphlets, or other written materials from your doctor or pharmacy?: 1 - Never  Interpreter Needed?: No  Information entered by :: [facility], LPN  Past Medical History:  Diagnosis Date  . Cancer ([place])    Thyroid-follwed by Endocrine   Past Surgical History:  Procedure Laterality Date  . ABDOMINAL  HYSTERECTOMY    . APPENDECTOMY  1996  . BUNIONECTOMY  05/2007   and hammer-toe  . CYSTOCELE REPAIR  1989  . HERNIA REPAIR Left 1993  . HYMENECTOMY  1971  . TONSILLECTOMY AND ADENOIDECTOMY  1972  . TOTAL THYROIDECTOMY  02/08/2009   Family History  Problem Relation Age of Onset  . Breast cancer Sister   . Diabetes Sister   . Osteoporosis Mother   . Stroke Mother        in 2014  . Alzheimer's disease Father   . Diabetes Father   . Diabetes Sister   . Breast cancer Maternal Aunt   . Hypertension Other   . Hyperlipidemia Other   . Heart disease Other   . Diabetes Other   . Alzheimer's disease Other    Social History   Socioeconomic History  . Marital status: Married    Spouse name: Erica Pineda  . Number of children: 3  . Years of education: 78  . Highest education level: Master's degree (e.g., MA, MS, MEng, MEd, MSW, MBA)  Occupational History  . 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 Financial resource strain: Not hard at all  . Food insecurity:    Worry: Never true    Inability: Never true  . Transportation needs:    Medical: No    Non-medical: No  Tobacco Use  . Smoking status: Never Smoker  . Smokeless tobacco: Never Used  Substance and Sexual  Activity  . Alcohol use: Yes    Alcohol/week: 1.0 - 2.0 standard drinks    Types: 1 - 2 Glasses of wine per week    Comment: occasional  . Drug use: No  . Sexual activity: Not on file  Lifestyle  . Physical activity:    Days per week: 5 days    Minutes per session: 40 min  . Stress: Not at all  Relationships  . Social connections:    Talks on phone: Patient refused    Gets together: Patient refused    Attends religious service: Patient refused    Active member of club or organization: Patient refused    Attends meetings of clubs or organizations: Patient refused    Relationship status: Patient refused  Other Topics Concern  . Not on file   Social History Narrative  . Not on file    Outpatient Encounter Medications as of 07/08/2018  Medication Sig  . aspirin 81 MG EC tablet Take 81 mg by mouth daily. Swallow whole.  . Biotin 5 MG TABS Take by mouth daily.  . Cetirizine HCl 10 MG CAPS Take 1 capsule by mouth as needed. Reported on 06/30/2015  . Cholecalciferol (VITAMIN D3) 50 MCG (2000 UT) TABS Take 2,000 Units by mouth daily.  Marland Kitchen levothyroxine (SYNTHROID) 125 MCG tablet Take 125 mcg by mouth daily before breakfast. Takes 1/2 tablet on Sundays.  . naproxen sodium (ANAPROX) 220 MG tablet Take 440 mg by mouth 2 (two) times daily as needed.   . Vitamin D, Ergocalciferol, (DRISDOL) 1.25 MG (50000 UT) CAPS capsule TAKE 1 CAPSULE (50,000 UNITS TOTAL) BY MOUTH EVERY 7 (SEVEN) DAYS.  Marland Kitchen Cholecalciferol (VITAMIN D3) 1000 units CAPS Take by mouth daily.   Marland Kitchen econazole nitrate 1 % cream Apply 1 application topically 2 (two) times daily.  . fluticasone (VERAMYST) 27.5 MCG/SPRAY nasal spray Place 2 sprays into the nose as needed. Reported on 08/09/2015  . levothyroxine (SYNTHROID, LEVOTHROID) 137 MCG tablet TAKE 1 TABLET (137 MCG TOTAL) BY MOUTH DAILY BEFORE BREAKFAST. (Patient not taking: Reported on 07/08/2018)  . MULTIPLE VITAMINS-MINERALS PO Take by mouth daily.   . OMEGA-3 FATTY ACIDS PO Take by mouth.   No facility-administered encounter medications on file as of 07/08/2018.     Activities of Daily Living In your present state of health, do you have any difficulty performing the following activities: 07/08/2018  Hearing? N  Vision? N  Comment Wears eye glasses daily.   Difficulty concentrating or making decisions? N  Walking or climbing stairs? N  Dressing or bathing? N  Doing errands, shopping? N  Preparing Food and eating ? N  Using the Toilet? N  In the past six months, have you accidently leaked urine? N  Comment Had a bladder tact.   Do you have problems with loss of bowel control? N  Managing your Medications? N  Managing your  Finances? N  Housekeeping or managing your Housekeeping? N  Some recent data might be hidden    Patient Care Team: Mar Daring, PA-C as PCP - General (Family Medicine) Beverly Gust, MD as Consulting Physician (Otolaryngology) Lanier Clam, MD as Consulting Physician (Internal Medicine) Elvina Mattes, Adele Schilder as Attending Physician (Podiatry) Garth Bigness as Referring Physician (Chiropractic Medicine) Teodoro Spray, MD as Consulting Physician (Cardiology)    Assessment:   This is a routine wellness examination for Erica Pineda.  Exercise Activities and Dietary recommendations Current Exercise Habits: Home exercise routine, Type of exercise: strength training/weights;stretching;yoga, Time (Minutes): 40,  Frequency (Times/Week): 5, Weekly Exercise (Minutes/Week): 200, Intensity: Moderate, Exercise limited by: None identified  Goals    . Continue Diet     Continue current diet plan (Keto diet) to help aid in weight loss.     . Reduce portion size     Continue to eat healthy. Continue Weight Watchers.         Fall Risk: Fall Risk  07/08/2018 07/05/2017 08/10/2016 02/09/2016 08/06/2014  Falls in the past year? 0 No No No No    FALL RISK PREVENTION PERTAINING TO THE HOME:  Any stairs in or around the home? Yes  If so, are there any without handrails? Yes   Home free of loose throw rugs in walkways, pet beds, electrical cords, etc? Yes  Adequate lighting in your home to reduce risk of falls? Yes   ASSISTIVE DEVICES UTILIZED TO PREVENT FALLS:  Life alert? No  Use of a cane, walker or w/c? No  Grab bars in the bathroom? Yes  Shower chair or bench in shower? No  Elevated toilet seat or a handicapped toilet? No    TIMED UP AND GO:  Was the test performed? No .    Depression Screen PHQ 2/9 Scores 07/08/2018 07/05/2017 08/10/2016 02/09/2016  PHQ - 2 Score 0 0 0 0     Cognitive Function     6CIT Screen 07/08/2018 07/05/2017  What Year? 0 points 0 points  What month? 0  points 0 points  What time? 0 points 0 points  Count back from 20 0 points 0 points  Months in reverse 0 points 0 points  Repeat phrase 0 points 0 points  Total Score 0 0    Immunization History  Administered Date(s) Administered  . Influenza, High Dose Seasonal PF 01/26/2018  . Influenza,inj,Quad PF,6+ Mos 12/16/2012  . Pneumococcal Conjugate-13 08/10/2016  . Pneumococcal Polysaccharide-23 08/21/2017  . Tdap 03/31/2010  . Zoster Recombinat (Shingrix) 08/10/2016, 10/10/2016    Qualifies for Shingles Vaccine? Completed series  Tdap: Up to date  Flu Vaccine: Up to date, due for Flu vaccine fall 2020.  Pneumococcal Vaccine: Completed series  Screening Tests Health Maintenance  Topic Date Due  . MAMMOGRAM  08/25/2018  . INFLUENZA VACCINE  08/31/2018  . TETANUS/TDAP  03/30/2020  . DEXA SCAN  08/17/2021  . COLONOSCOPY  01/30/2027  . Hepatitis C Screening  Completed  . PNA vac Low Risk Adult  Completed    Cancer Screenings:  Colorectal Screening: Completed 01/29/17. Repeat every 10 years.  Mammogram: Completed 08/24/17.   Bone Density: Completed 09/04/16. Results reflect NORMAL. Repeat every 5 years.   Lung Cancer Screening: (Low Dose CT Chest recommended if Age 24-80 years, 30 pack-year currently smoking OR have quit w/in 15years.) does not qualify.   Additional Screening:  Hepatitis C Screening: Up to date  Vision Screening: Recommended annual ophthalmology exams for early detection of glaucoma and other disorders of the eye.  Dental Screening: Recommended annual dental exams for proper oral hygiene  Community Resource Referral:  CRR required this visit?  No       Plan:  I have personally reviewed and addressed the Medicare Annual Wellness questionnaire and have noted the following in the patient's chart:  A. Medical and social history B. Use of alcohol, tobacco or illicit drugs  C. Current medications and supplements D. Functional ability and status E.   Nutritional status F.  Physical activity G. Advance directives H. List of other physicians I.  Hospitalizations, surgeries, and ER visits in  previous 12 months J.  [place] such as hearing and vision if needed, cognitive and depression L. Referrals and appointments   In addition, I have reviewed and discussed with patient certain preventive protocols, quality metrics, and best practice recommendations. A written personalized care plan for preventive services as well as general preventive health recommendations were provided to patient. Nurse Health Advisor  Signed,    Momo Braun [place], [place]  07/08/8612 Nurse Health Advisor   Nurse Notes: None.

## 2018-08-20 ENCOUNTER — Telehealth: Payer: Self-pay | Admitting: Physician Assistant

## 2018-08-20 DIAGNOSIS — E7800 Pure hypercholesterolemia, unspecified: Secondary | ICD-10-CM

## 2018-08-20 DIAGNOSIS — R739 Hyperglycemia, unspecified: Secondary | ICD-10-CM

## 2018-08-20 DIAGNOSIS — Z6833 Body mass index (BMI) 33.0-33.9, adult: Secondary | ICD-10-CM

## 2018-08-20 DIAGNOSIS — E039 Hypothyroidism, unspecified: Secondary | ICD-10-CM

## 2018-08-20 DIAGNOSIS — E559 Vitamin D deficiency, unspecified: Secondary | ICD-10-CM

## 2018-08-20 NOTE — Telephone Encounter (Signed)
Pt asking if she can get orders for her blood work to be done BEFORE her CPE with [facility].  She wants to discuss results in person at the CPE visit on Mon.  Please advise.  Thanks, [employer]

## 2018-08-20 NOTE — Telephone Encounter (Signed)
Ordered

## 2018-08-21 NOTE — Telephone Encounter (Signed)
Patient advised.

## 2018-08-23 ENCOUNTER — Telehealth: Payer: Self-pay

## 2018-08-23 LAB — COMPREHENSIVE METABOLIC PANEL
ALT: 17 IU/L (ref 0–32)
AST: 20 IU/L (ref 0–40)
Albumin/Globulin Ratio: 1.8 (ref 1.2–2.2)
Albumin: 4.1 g/dL (ref 3.8–4.8)
Alkaline Phosphatase: 61 IU/L (ref 39–117)
BUN/Creatinine Ratio: 17 (ref 12–28)
BUN: 13 mg/dL (ref 8–27)
Bilirubin Total: 0.3 mg/dL (ref 0.0–1.2)
CO2: 19 mmol/L — ABNORMAL LOW (ref 20–29)
Calcium: 9.1 mg/dL (ref 8.7–10.3)
Chloride: 105 mmol/L (ref 96–106)
Creatinine, Ser: 0.76 mg/dL (ref 0.57–1.00)
GFR calc Af Amer: 94 mL/min/{1.73_m2} (ref >59)
GFR calc non Af Amer: 81 mL/min/{1.73_m2} (ref >59)
Globulin, Total: 2.3 g/dL (ref 1.5–4.5)
Glucose: 110 mg/dL — ABNORMAL HIGH (ref 65–99)
Potassium: 4.9 mmol/L (ref 3.5–5.2)
Sodium: 141 mmol/L (ref 134–144)
Total Protein: 6.4 g/dL (ref 6.0–8.5)

## 2018-08-23 LAB — CBC WITH DIFFERENTIAL/PLATELET
Basophils Absolute: 0.1 10*3/uL (ref 0.0–0.2)
Basos: 1 %
EOS (ABSOLUTE): 0.3 10*3/uL (ref 0.0–0.4)
Eos: 7 %
Hematocrit: 41.8 % (ref 34.0–46.6)
Hemoglobin: 13.8 g/dL (ref 11.1–15.9)
Immature Grans (Abs): 0 10*3/uL (ref 0.0–0.1)
Immature Granulocytes: 0 %
Lymphocytes Absolute: 1.5 10*3/uL (ref 0.7–3.1)
Lymphs: 31 %
MCH: 27.8 pg (ref 26.6–33.0)
MCHC: 33 g/dL (ref 31.5–35.7)
MCV: 84 fL (ref 79–97)
Monocytes Absolute: 0.4 10*3/uL (ref 0.1–0.9)
Monocytes: 8 %
Neutrophils Absolute: 2.6 10*3/uL (ref 1.4–7.0)
Neutrophils: 53 %
Platelets: 301 10*3/uL (ref 150–450)
RBC: 4.97 x10E6/uL (ref 3.77–5.28)
RDW: 12.5 % (ref 11.7–15.4)
WBC: 4.9 10*3/uL (ref 3.4–10.8)

## 2018-08-23 LAB — HEMOGLOBIN A1C
Est. average glucose Bld gHb Est-mCnc: 128 mg/dL
Hgb A1c MFr Bld: 6.1 % — ABNORMAL HIGH (ref 4.8–5.6)

## 2018-08-23 LAB — TSH: TSH: 0.157 u[IU]/mL — ABNORMAL LOW (ref 0.450–4.500)

## 2018-08-23 LAB — LIPID PANEL
Chol/HDL Ratio: 5.8 ratio — ABNORMAL HIGH (ref 0.0–4.4)
Cholesterol, Total: 278 mg/dL — ABNORMAL HIGH (ref 100–199)
HDL: 48 mg/dL (ref >39)
LDL Calculated: 195 mg/dL — ABNORMAL HIGH (ref 0–99)
Triglycerides: 176 mg/dL — ABNORMAL HIGH (ref 0–149)
VLDL Cholesterol Cal: 35 mg/dL (ref 5–40)

## 2018-08-23 LAB — VITAMIN D 25 HYDROXY (VIT D DEFICIENCY, FRACTURES): Vit D, 25-Hydroxy: 36.1 ng/mL (ref 30.0–100.0)

## 2018-08-23 NOTE — Telephone Encounter (Signed)
-----   Message from Mar Daring, [place] sent at 08/23/2018 11:36 AM EDT ----- Blood count is normal. Kidney and liver function are normal. Sodium, potassium and calcium are normal. Thyroid is slightly overcorrected. Are you taking the levothyroxine 147mcg or 126mcg? Cholesterol also increased from last year. I would still recommend a cholesterol lowering medication, but we can discuss at your visit next week. A1c is stable. Vit D is low normal. Ok to transition to OTC Vit D 1000-2000 IU daily once completed high dose Rx.

## 2018-08-23 NOTE — Telephone Encounter (Signed)
Pt advised.  She was not sure of the dose of her levothyroxine.  She also takes the RX vitamin D and OTC Vitamin D as well.   I advised her to bring all her medications with her on Monday so you can go over it with her.     Thanks,   -Mickel Baas

## 2018-08-23 NOTE — Telephone Encounter (Signed)
agreed

## 2018-08-26 ENCOUNTER — Encounter: Payer: Self-pay | Admitting: Physician Assistant

## 2018-08-26 ENCOUNTER — Other Ambulatory Visit: Payer: Self-pay

## 2018-08-26 ENCOUNTER — Ambulatory Visit (INDEPENDENT_AMBULATORY_CARE_PROVIDER_SITE_OTHER): Payer: Medicare Other | Admitting: Physician Assistant

## 2018-08-26 VITALS — BP 128/88 | HR 76 | Temp 97.9°F | Wt 229.0 lb

## 2018-08-26 DIAGNOSIS — E7800 Pure hypercholesterolemia, unspecified: Secondary | ICD-10-CM

## 2018-08-26 DIAGNOSIS — E0590 Thyrotoxicosis, unspecified without thyrotoxic crisis or storm: Secondary | ICD-10-CM | POA: Diagnosis not present

## 2018-08-26 DIAGNOSIS — E890 Postprocedural hypothyroidism: Secondary | ICD-10-CM

## 2018-08-26 DIAGNOSIS — Z1239 Encounter for other screening for malignant neoplasm of breast: Secondary | ICD-10-CM

## 2018-08-26 DIAGNOSIS — M25561 Pain in right knee: Secondary | ICD-10-CM

## 2018-08-26 DIAGNOSIS — Z85850 Personal history of malignant neoplasm of thyroid: Secondary | ICD-10-CM

## 2018-08-26 DIAGNOSIS — G8929 Other chronic pain: Secondary | ICD-10-CM

## 2018-08-26 DIAGNOSIS — E559 Vitamin D deficiency, unspecified: Secondary | ICD-10-CM

## 2018-08-26 DIAGNOSIS — Z0000 Encounter for general adult medical examination without abnormal findings: Secondary | ICD-10-CM | POA: Diagnosis not present

## 2018-08-26 MED ORDER — LEVOTHYROXINE SODIUM 125 MCG PO TABS: 125.0000 ug | ORAL_TABLET | Freq: Every day | ORAL | 3 refills | Status: DC

## 2018-08-26 MED ORDER — VITAMIN D (ERGOCALCIFEROL) 1.25 MG (50000 UNIT) PO CAPS: ORAL_CAPSULE | ORAL | 2 refills | Status: DC

## 2018-08-26 MED ORDER — NAPROXEN 500 MG PO TABS: 500.0000 mg | ORAL_TABLET | Freq: Two times a day (BID) | ORAL | 1 refills | Status: DC

## 2018-08-26 NOTE — Progress Notes (Signed)
Patient: Erica Pineda, Female    DOB: 08-Jul-1950, 68 y.o.   MRN: [id number] Visit Date: 08/26/2018  Today's Provider: Mar Daring, PA-C   Chief Complaint  Patient presents with  . Annual Exam   Subjective:     Complete Physical Erica Pineda is a 68 y.o. female. She feels well. She reports exercising everyday. She reports she is sleeping well. ----------------------------------------------------------- Last pap: s/p total hysterectomy Last mammogram:08/24/2017  Review of Systems  Constitutional: Negative.   HENT: Negative.   Eyes: Positive for itching (allergies per pt).  Respiratory: Negative.   Cardiovascular: Negative.   Gastrointestinal: Negative.   Endocrine: Negative.   Genitourinary: Negative.   Musculoskeletal: Negative.   Skin: Negative.   Allergic/Immunologic: Positive for food allergies.  Neurological: Negative.   Hematological: Negative.   Psychiatric/Behavioral: Negative.     Social History   Socioeconomic History  . Marital status: Married    Spouse name: Annie Main  . Number of children: 3  . Years of education: 46  . Highest education level: Master's degree (e.g., MA, MS, MEng, MEd, MSW, MBA)  Occupational History  . 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 Financial resource strain: Not hard at all  . Food insecurity    Worry: Never true    Inability: Never true  . Transportation needs    Medical: No    Non-medical: No  Tobacco Use  . Smoking status: Never Smoker  . Smokeless tobacco: Never Used  Substance and Sexual Activity  . Alcohol use: Yes    Alcohol/week: 1.0 - 2.0 standard drinks    Types: 1 - 2 Glasses of wine per week    Comment: occasional  . Drug use: No  . Sexual activity: Not on file  Lifestyle  . Physical activity    Days per week: 5 days    Minutes per session: 40 min  . Stress: Not at all  Relationships  . Social Herbalist on  phone: Patient refused    Gets together: Patient refused    Attends religious service: Patient refused    Active member of club or organization: Patient refused    Attends meetings of clubs or organizations: Patient refused    Relationship status: Patient refused  . Intimate partner violence    Fear of current or ex partner: Patient refused    Emotionally abused: Patient refused    Physically abused: Patient refused    Forced sexual activity: Patient refused  Other Topics Concern  . Not on file  Social History Narrative  . Not on file    Past Medical History:  Diagnosis Date  . Cancer ([place])    Thyroid-follwed by Endocrine     Patient Active Problem List   Diagnosis Date Noted  . Family history of breast cancer 08/10/2016  . BMI 33.0-33.9,adult 08/10/2016  . Lipoma of neck 11/25/2015  . Lipoma 06/30/2015  . Chronic frontal sinusitis 06/11/2014  . Blood glucose elevated 06/11/2014  . Adult hypothyroidism 06/11/2014  . Avitaminosis D 06/11/2014  . 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 Dysphonia 04/07/2013  . Cancer of thyroid ([place]) 03/18/2009  . Hay fever 05/07/2006  . Hypercholesteremia 05/07/2006    Past Surgical History:  Procedure Laterality Date  . ABDOMINAL HYSTERECTOMY    . APPENDECTOMY  1996  . BUNIONECTOMY  05/2007   and hammer-toe  . CYSTOCELE REPAIR  [place]  . HYMENECTOMY  1971  . TONSILLECTOMY AND ADENOIDECTOMY  1972  . TOTAL THYROIDECTOMY  02/08/2009    Her family history includes Alzheimer's disease in her father and another family member; Breast cancer in her maternal aunt and sister; Diabetes in her father, sister, sister, and another family member; Heart disease in an other family member; Hyperlipidemia in an other family member; Hypertension in an other family member; Osteoporosis in her mother; Stroke in her mother.   Current Outpatient Medications:  .  aspirin 81 MG EC tablet, Take 81 mg by mouth daily. Swallow whole., Disp: ,  Rfl:  .  Biotin 5 MG TABS, Take by mouth daily., Disp: , Rfl:  .  Cetirizine HCl 10 MG CAPS, Take 1 capsule by mouth as needed. Reported on 06/30/2015, Disp: , Rfl:  .  Cholecalciferol (VITAMIN D3) 1000 units CAPS, Take by mouth daily. , Disp: , Rfl:  .  Cholecalciferol (VITAMIN D3) 50 MCG (2000 UT) TABS, Take 2,000 Units by mouth daily., Disp: , Rfl:  .  econazole nitrate 1 % cream, Apply 1 application topically 2 (two) times daily., Disp: , Rfl:  .  fluticasone (VERAMYST) 27.5 MCG/SPRAY nasal spray, Place 2 sprays into the nose as needed. Reported on 08/09/2015, Disp: , Rfl:  .  levothyroxine (SYNTHROID) 125 MCG tablet, Take 125 mcg by mouth daily before breakfast. Takes 1/2 tablet on Sundays., Disp: , Rfl:  .  naproxen sodium (ANAPROX) 220 MG tablet, Take 440 mg by mouth 2 (two) times daily as needed. , Disp: , Rfl:  .  Vitamin D, Ergocalciferol, (DRISDOL) 1.25 MG (50000 UT) CAPS capsule, TAKE 1 CAPSULE (50,000 UNITS TOTAL) BY MOUTH EVERY 7 (SEVEN) DAYS., Disp: 12 capsule, Rfl: 2 .  levothyroxine (SYNTHROID, LEVOTHROID) 137 MCG tablet, TAKE 1 TABLET (137 MCG TOTAL) BY MOUTH DAILY BEFORE BREAKFAST. (Patient not taking: Reported on 07/08/2018), Disp: 90 tablet, Rfl: 1 .  MULTIPLE VITAMINS-MINERALS PO, Take by mouth daily. , Disp: , Rfl:  .  OMEGA-3 FATTY ACIDS PO, Take by mouth., Disp: , Rfl:   Patient Care Team: Mar Daring, PA-C as PCP - General (Family Medicine) Beverly Gust, MD as Consulting Physician (Otolaryngology) Lanier Clam, MD as Consulting Physician (Internal Medicine) Elvina Mattes, Rodman Key, DPM as Attending Physician (Podiatry) Garth Bigness as Referring Physician (Chiropractic Medicine) Teodoro Spray, MD as Consulting Physician (Cardiology)     Objective:    Vitals: BP 128/88 (BP Location: Right Arm, Patient Position: Sitting, Cuff Size: Normal)   Pulse 76   Temp 97.9 F (36.6 C) (Oral)   Wt 229 lb (103.9 kg)   SpO2 96%   BMI 35.87 kg/m   Physical Exam  Vitals signs reviewed.  Constitutional:      General: She is not in acute distress.    Appearance: Normal appearance. She is well-developed. She is obese. She is not ill-appearing or diaphoretic.  HENT:     Head: Normocephalic and atraumatic.     Right Ear: Tympanic membrane, ear canal and external ear normal.     Left Ear: Tympanic membrane, ear canal and external ear normal.     Nose: Nose normal.     Mouth/Throat:     Mouth: Mucous membranes are moist.     Pharynx: Oropharynx is clear. No oropharyngeal exudate.  Eyes:     General: No scleral icterus.       Right eye: No discharge.        Left eye:  No discharge.     Extraocular Movements: Extraocular movements intact.     Conjunctiva/sclera: Conjunctivae normal.     Pupils: Pupils are equal, round, and reactive to light.  Neck:     Musculoskeletal: Normal range of motion and neck supple.     Thyroid: No thyromegaly.     Vascular: No carotid bruit or JVD.     Trachea: No tracheal deviation.  Cardiovascular:     Rate and Rhythm: Normal rate and regular rhythm.     Pulses: Normal pulses.     Heart sounds: Normal heart sounds. No murmur. No friction rub. No gallop.   Pulmonary:     Effort: Pulmonary effort is normal. No respiratory distress.     Breath sounds: Normal breath sounds. No wheezing or rales.  Chest:     Chest wall: No tenderness.  Abdominal:     General: Abdomen is flat. Bowel sounds are normal. There is no distension.     Palpations: Abdomen is soft. There is no mass.     Tenderness: There is no abdominal tenderness. There is no guarding or rebound.  Musculoskeletal: Normal range of motion.        General: No tenderness.  Lymphadenopathy:     Cervical: No cervical adenopathy.  Skin:    General: Skin is warm and dry.     Capillary Refill: Capillary refill takes less than 2 seconds.     Findings: No rash.  Neurological:     General: No focal deficit present.     Mental Status: She is alert and oriented to  person, place, and time. Mental status is at baseline.     Cranial Nerves: No cranial nerve deficit.     Motor: No weakness.     Coordination: Coordination normal.     Gait: Gait normal.  Psychiatric:        Mood and Affect: Mood normal.        Behavior: Behavior normal.        Thought Content: Thought content normal.        Judgment: Judgment normal.     Activities of Daily Living In your present state of health, do you have any difficulty performing the following activities: 07/08/2018  Hearing? N  Vision? N  Comment Wears eye glasses daily.   Difficulty concentrating or making decisions? N  Walking or climbing stairs? N  Dressing or bathing? N  Doing errands, shopping? N  Preparing Food and eating ? N  Using the Toilet? N  In the past six months, have you accidently leaked urine? N  Comment Had a bladder tact.   Do you have problems with loss of bowel control? N  Managing your Medications? N  Managing your Finances? N  Housekeeping or managing your Housekeeping? N  Some recent data might be hidden    Fall Risk Assessment Fall Risk  07/08/2018 07/05/2017 08/10/2016 02/09/2016 08/06/2014  Falls in the past year? 0 No No No No     Depression Screen PHQ 2/9 Scores 07/08/2018 07/05/2017 08/10/2016 02/09/2016  PHQ - 2 Score 0 0 0 0    6CIT Screen 07/08/2018  What Year? 0 points  What month? 0 points  What time? 0 points  Count back from 20 0 points  Months in reverse 0 points  Repeat phrase 0 points  Total Score 0       Assessment & Plan:    Annual Physical Reviewed patient's Family Medical History Reviewed and updated list of patient's medical  providers Assessment of cognitive impairment was done Assessed patient's functional ability Established a written schedule for health screening [place] Completed and Reviewed  Exercise Activities and Dietary recommendations Goals    . Continue Diet     Continue current diet plan (Keto diet) to help aid in  weight loss.     . Reduce portion size     Continue to eat healthy. Continue Weight Watchers.         Immunization History  Administered Date(s) Administered  . Influenza, High Dose Seasonal PF 01/26/2018  . Influenza,inj,Quad PF,6+ Mos 12/16/2012  . Pneumococcal Conjugate-13 08/10/2016  . Pneumococcal Polysaccharide-23 08/21/2017  . Tdap 03/31/2010  . Zoster Recombinat (Shingrix) 08/10/2016, 10/10/2016    Health Maintenance  Topic Date Due  . MAMMOGRAM  08/25/2018  . INFLUENZA VACCINE  08/31/2018  . TETANUS/TDAP  03/30/2020  . DEXA SCAN  08/17/2021  . COLONOSCOPY  01/30/2027  . Hepatitis C Screening  Completed  . PNA vac Low Risk Adult  Completed     Discussed health benefits of physical activity, and encouraged her to engage in regular exercise appropriate for her age and condition.    1. Annual physical exam Normal physical exam today. Labs completed and reviewed today in the office. She is to call the office in the meantime if she has any acute issue, questions or concerns.  2. Breast cancer screening Has scheduled for Friday, 08/30/18, at [facility].  3. Avitaminosis D Stable. Diagnosis pulled for medication refill. Continue current medical treatment plan. - Vitamin D, Ergocalciferol, (DRISDOL) 1.25 MG (50000 UT) CAPS capsule; TAKE 1 CAPSULE (50,000 UNITS TOTAL) BY MOUTH EVERY 7 (SEVEN) DAYS.  Dispense: 12 capsule; Refill: 2  4. Postoperative hypothyroidism Slightly overcorrected. Will decrease levothyroxine to 163mcg daily except take 1/2 tab twice weekly. Will recheck thyroid in 4-6 weeks.  - levothyroxine (SYNTHROID) 125 MCG tablet; Take 1 tablet (125 mcg total) by mouth daily before breakfast. Takes 1/2 tablet twice weekly  Dispense: 90 tablet; Refill: 3  6. Chronic pain of right knee Stable. Diagnosis pulled for medication refill. Continue current medical treatment plan. - naproxen (NAPROSYN) 500 MG tablet; Take 1 tablet (500 mg total) by mouth 2 (two) times  daily with a meal.  Dispense: 180 tablet; Refill: 1  7. Hypercholesteremia Slightly elevated compared to last year. Suspect due to keto diet. Advised to discontinue keto and consider carb cycling vs mediterranean diet. Will recheck lipids in 6 months.   8. History of thyroid cancer Followed by [facility]. Last lymph node mapping was normal. Establishing with new endocrinologist in November as her Endocrinologist left to move to [place].   ------------------------------------------------------------------------------------------------------------    Mar Daring, PA-C  [facility]

## 2018-08-26 NOTE — Patient Instructions (Signed)
Health Maintenance After Age 67 After age 67, you are at a higher risk for certain long-term diseases and infections as well as injuries from falls. Falls are a major cause of broken bones and head injuries in people who are older than age 67. Getting regular preventive care can help to keep you healthy and well. Preventive care includes getting regular testing and making lifestyle changes as recommended by your health care provider. Talk with your health care provider about:  Which screenings and tests you should have. A screening is a test that checks for a disease when you have no symptoms.  A diet and exercise plan that is right for you. What should I know about screenings and tests to prevent falls? Screening and testing are the best ways to find a health problem early. Early diagnosis and treatment give you the best chance of managing medical conditions that are common after age 67. Certain conditions and lifestyle choices may make you more likely to have a fall. Your health care provider may recommend:  Regular vision checks. Poor vision and conditions such as cataracts can make you more likely to have a fall. If you wear glasses, make sure to get your prescription updated if your vision changes.  Medicine review. Work with your health care provider to regularly review all of the medicines you are taking, including over-the-counter medicines. Ask your health care provider about any side effects that may make you more likely to have a fall. Tell your health care provider if any medicines that you take make you feel dizzy or sleepy.  Osteoporosis screening. Osteoporosis is a condition that causes the bones to get weaker. This can make the bones weak and cause them to break more easily.  Blood pressure screening. Blood pressure changes and medicines to control blood pressure can make you feel dizzy.  Strength and balance checks. Your health care provider may recommend certain tests to check your  strength and balance while standing, walking, or changing positions.  Foot health exam. Foot pain and numbness, as well as not wearing proper footwear, can make you more likely to have a fall.  Depression screening. You may be more likely to have a fall if you have a fear of falling, feel emotionally low, or feel unable to do activities that you used to do.  Alcohol use screening. Using too much alcohol can affect your balance and may make you more likely to have a fall. What actions can I take to lower my risk of falls? General instructions  Talk with your health care provider about your risks for falling. Tell your health care provider if: ? You fall. Be sure to tell your health care provider about all falls, even ones that seem minor. ? You feel dizzy, sleepy, or off-balance.  Take over-the-counter and prescription medicines only as told by your health care provider. These include any supplements.  Eat a healthy diet and maintain a healthy weight. A healthy diet includes low-fat dairy products, low-fat (lean) meats, and fiber from whole grains, beans, and lots of fruits and vegetables. Home safety  Remove any tripping hazards, such as rugs, cords, and clutter.  Install safety equipment such as grab bars in bathrooms and safety rails on stairs.  Keep rooms and walkways well-lit. Activity   Follow a regular exercise program to stay fit. This will help you maintain your balance. Ask your health care provider what types of exercise are appropriate for you.  If you need a cane or   walker, use it as recommended by your health care provider.  Wear supportive shoes that have nonskid soles. Lifestyle  Do not drink alcohol if your health care provider tells you not to drink.  If you drink alcohol, limit how much you have: ? 0-1 drink a day for women. ? 0-2 drinks a day for men.  Be aware of how much alcohol is in your drink. In the [place] one drink equals one typical bottle of beer (12  oz), one-half glass of wine (5 oz), or one shot of hard liquor (1 oz).  Do not use any products that contain nicotine or tobacco, such as cigarettes and e-cigarettes. If you need help quitting, ask your health care provider. Summary  Having a healthy lifestyle and getting preventive care can help to protect your health and wellness after age 67.  Screening and testing are the best way to find a health problem early and help you avoid having a fall. Early diagnosis and treatment give you the best chance for managing medical conditions that are more common for people who are older than age 67.  Falls are a major cause of broken bones and head injuries in people who are older than age 67. Take precautions to prevent a fall at home.  Work with your health care provider to learn what changes you can make to improve your health and wellness and to prevent falls. This information is not intended to replace advice given to you by your health care provider. Make sure you discuss any questions you have with your health care provider. Document Released: 11/29/2016 Document Revised: 05/09/2018 Document Reviewed: 11/29/2016 Elsevier Patient Education  2020 Elsevier Inc.  

## 2018-08-30 LAB — HM MAMMOGRAPHY

## 2018-09-04 ENCOUNTER — Encounter: Payer: Self-pay | Admitting: Physician Assistant

## 2018-10-17 ENCOUNTER — Encounter: Payer: Self-pay | Admitting: Physician Assistant

## 2018-10-18 ENCOUNTER — Encounter: Payer: Self-pay | Admitting: Physician Assistant

## 2018-10-22 ENCOUNTER — Other Ambulatory Visit: Payer: Self-pay | Admitting: *Deleted

## 2018-10-22 DIAGNOSIS — E0590 Thyrotoxicosis, unspecified without thyrotoxic crisis or storm: Secondary | ICD-10-CM

## 2018-10-23 LAB — TSH: TSH: 0.855 u[IU]/mL (ref 0.450–4.500)

## 2018-10-30 ENCOUNTER — Ambulatory Visit: Payer: Medicare Other

## 2018-11-06 ENCOUNTER — Ambulatory Visit (INDEPENDENT_AMBULATORY_CARE_PROVIDER_SITE_OTHER): Payer: Medicare Other

## 2018-11-06 ENCOUNTER — Other Ambulatory Visit: Payer: Self-pay

## 2018-11-06 DIAGNOSIS — Z23 Encounter for immunization: Secondary | ICD-10-CM | POA: Diagnosis not present

## 2019-02-23 ENCOUNTER — Encounter: Payer: Self-pay | Admitting: Physician Assistant

## 2019-02-23 DIAGNOSIS — E7800 Pure hypercholesterolemia, unspecified: Secondary | ICD-10-CM

## 2019-03-08 ENCOUNTER — Encounter: Payer: Self-pay | Admitting: Physician Assistant

## 2019-03-08 LAB — LIPID PANEL
Chol/HDL Ratio: 4.5 ratio — ABNORMAL HIGH (ref 0.0–4.4)
Cholesterol, Total: 248 mg/dL — ABNORMAL HIGH (ref 100–199)
HDL: 55 mg/dL (ref >39)
LDL Chol Calc (NIH): 165 mg/dL — ABNORMAL HIGH (ref 0–99)
Triglycerides: 153 mg/dL — ABNORMAL HIGH (ref 0–149)
VLDL Cholesterol Cal: 28 mg/dL (ref 5–40)

## 2019-03-10 ENCOUNTER — Telehealth: Payer: Self-pay

## 2019-03-10 NOTE — Telephone Encounter (Signed)
Called patient no answer and left voicemail for patient to return the call to be advised of labs. If patient returns call it is ok for P[facility] to advise patient of lab results.

## 2019-03-10 NOTE — Telephone Encounter (Signed)
-----   Message from Mar Daring, [place] sent at 03/10/2019  7:30 AM EST ----- Cholesterol is still elevated but has improved quite well. Total cholesterol is down to 248 from 278 and LDL (bad) cholesterol is down to 165 from 195. Triglycerides have also improved from 176 to 153 now.

## 2019-03-10 NOTE — Telephone Encounter (Signed)
Reviewed lab results and physician's note with the patient. No further questions. 

## 2019-05-26 ENCOUNTER — Encounter: Payer: Self-pay | Admitting: Physician Assistant

## 2019-05-26 ENCOUNTER — Other Ambulatory Visit: Payer: Self-pay

## 2019-05-26 ENCOUNTER — Ambulatory Visit: Payer: Medicare PPO | Admitting: Physician Assistant

## 2019-05-26 VITALS — BP 125/83 | HR 75 | Temp 97.1°F | Wt 225.0 lb

## 2019-05-26 DIAGNOSIS — R42 Dizziness and giddiness: Secondary | ICD-10-CM

## 2019-05-26 DIAGNOSIS — M6780 Other specified disorders of synovium and tendon, unspecified site: Secondary | ICD-10-CM

## 2019-05-26 DIAGNOSIS — E7800 Pure hypercholesterolemia, unspecified: Secondary | ICD-10-CM

## 2019-05-26 DIAGNOSIS — K649 Unspecified hemorrhoids: Secondary | ICD-10-CM | POA: Diagnosis not present

## 2019-05-26 DIAGNOSIS — R221 Localized swelling, mass and lump, neck: Secondary | ICD-10-CM

## 2019-05-26 DIAGNOSIS — Z85850 Personal history of malignant neoplasm of thyroid: Secondary | ICD-10-CM

## 2019-05-26 MED ORDER — HYDROCORTISONE ACETATE 25 MG RE SUPP: 25.0000 mg | Freq: Two times a day (BID) | RECTAL | 0 refills | Status: DC

## 2019-05-26 NOTE — Progress Notes (Signed)
Established patient visit   Patient: Erica Pineda   DOB: 29-Apr-1950   69 y.o. Female  MRN: [id number] Visit Date: 05/28/2019  Today's healthcare provider: Mar Daring, PA-C   Chief Complaint  Patient presents with  . Adenopathy    Left side of neck seems swollen (History of Thyroid Cancer)  . Hiatal Hernia  . Lipoma    Right hand.   Subjective    HPI [name] is a 69 yr old female that comes today noticing a fullness to the left side of her neck. She denies any pain or issues swallowing.   She also complains of a "knot" in her right hand, palmar side. 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 It does occasionally cause pain. She denies any triggering of her finger.   Also having issues of a bleeding hemorrhoid. Has been bothering her for about 2 weeks. Does happen intermittently but this time is not improving.   Also noting some intermittent lightheadedness. No known trigger. Denies with changes of position from sitting to standing. Unsure if associated with changes in head position. Denies spinning sensation. Does try to stay well hydrated and tracks her fluid intake. Normally getting over 80oz water daily.   Patient Active Problem List   Diagnosis Date Noted  . Family history of breast cancer 08/10/2016  . BMI 33.0-33.9,adult 08/10/2016  . Lipoma of neck 11/25/2015  . Lipoma 06/30/2015  . Chronic frontal sinusitis 06/11/2014  . Blood glucose elevated 06/11/2014  . Adult hypothyroidism 06/11/2014  . Avitaminosis D 06/11/2014  . 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 Dysphonia 04/07/2013  . History of thyroid cancer 03/18/2009  . Hay fever 05/07/2006  . Hypercholesteremia 05/07/2006   Past Medical History:  Diagnosis Date  . Cancer ([place])    Thyroid-follwed by Endocrine   Allergies  Allergen Reactions  . Percodan  [Oxycodone-Aspirin]   . Simvastatin Swelling    Other reaction(s): Muscle Pain       Medications: Outpatient Medications Prior to Visit  Medication Sig    . aspirin 81 MG EC tablet Take 81 mg by mouth daily. Swallow whole.  . Biotin 5 MG TABS Take by mouth daily.  . Cetirizine HCl 10 MG CAPS Take 1 capsule by mouth as needed. Reported on 06/30/2015  . Cholecalciferol (VITAMIN D3) 1000 units CAPS Take by mouth daily.   Marland Kitchen levothyroxine (SYNTHROID) 125 MCG tablet Take 1 tablet (125 mcg total) by mouth daily before breakfast. Takes 1/2 tablet twice weekly  . naproxen (NAPROSYN) 500 MG tablet Take 1 tablet (500 mg total) by mouth 2 (two) times daily with a meal.  . Vitamin D, Ergocalciferol, (DRISDOL) 1.25 MG (50000 UT) CAPS capsule TAKE 1 CAPSULE (50,000 UNITS TOTAL) BY MOUTH EVERY 7 (SEVEN) DAYS.  Marland Kitchen econazole nitrate 1 % cream Apply 1 application topically 2 (two) times daily.  . MULTIPLE VITAMINS-MINERALS PO Take by mouth daily.   . [DISCONTINUED] Cholecalciferol (VITAMIN D3) 50 MCG (2000 UT) TABS Take 2,000 Units by mouth daily.  . [DISCONTINUED] fluticasone (VERAMYST) 27.5 MCG/SPRAY nasal spray Place 2 sprays into the nose as needed. Reported on 08/09/2015  . [DISCONTINUED] OMEGA-3 FATTY ACIDS PO Take by mouth.   No facility-administered medications prior to visit.    Review of Systems  Constitutional: Negative.   HENT: Negative for sore throat and trouble swallowing.   Respiratory: Negative.   Cardiovascular: Negative.   Gastrointestinal: Positive for blood in stool. Negative for abdominal distention, abdominal pain, diarrhea, nausea, rectal pain and  vomiting.  Musculoskeletal: Positive for myalgias. Negative for arthralgias, back pain, gait problem, joint swelling, neck pain and neck stiffness.  Neurological: Negative for dizziness, light-headedness and headaches.    Last CBC Lab Results  Component Value Date   WBC 4.9 08/22/2018   HGB 13.8 08/22/2018   HCT 41.8 08/22/2018   MCV 84 08/22/2018   MCH 27.8 08/22/2018   RDW 12.5 08/22/2018   PLT 301 [id number]   Last metabolic panel Lab Results  Component Value Date   GLUCOSE 110  (H) 08/22/2018   NA 141 08/22/2018   K 4.9 08/22/2018   CL 105 08/22/2018   CO2 19 (L) 08/22/2018   BUN 13 08/22/2018   CREATININE 0.76 08/22/2018   GFRNONAA 81 08/22/2018   GFRAA 94 08/22/2018   CALCIUM 9.1 08/22/2018   PROT 6.4 08/22/2018   ALBUMIN 4.1 08/22/2018   LABGLOB 2.3 08/22/2018   AGRATIO 1.8 08/22/2018   BILITOT 0.3 08/22/2018   ALKPHOS 61 08/22/2018   AST 20 08/22/2018   ALT 17 08/22/2018       Objective    BP 125/83 (BP Location: Left Arm, Patient Position: Sitting, Cuff Size: Large)   Pulse 75   Temp (!) 97.1 F (36.2 C) (Temporal)   Wt 225 lb (102.1 kg)   BMI 35.24 kg/m  BP Readings from Last 3 Encounters:  05/26/19 125/83  08/26/18 128/88  01/11/18 130/85   Wt Readings from Last 3 Encounters:  05/26/19 225 lb (102.1 kg)  08/26/18 229 lb (103.9 kg)  01/11/18 230 lb (104.3 kg)      Physical Exam Vitals reviewed.  Constitutional:      General: She is not in acute distress.    Appearance: Normal appearance. She is well-developed. She is obese. She is not ill-appearing.  HENT:     Head: Normocephalic and atraumatic.  Neck:     Vascular: No carotid bruit.   Cardiovascular:     Rate and Rhythm: Normal rate and regular rhythm.     Pulses: Normal pulses.     Heart sounds: No murmur.  Pulmonary:     Effort: Pulmonary effort is normal. No respiratory distress.     Breath sounds: Normal breath sounds. No wheezing.  Musculoskeletal:     Cervical back: Normal range of motion and neck supple. No tenderness. No pain with movement, spinous process tenderness or muscular tenderness. Normal range of motion.  Lymphadenopathy:     Cervical: No cervical adenopathy.  Neurological:     Mental Status: She is alert.  Psychiatric:        Mood and Affect: Mood normal.        Behavior: Behavior normal.        Thought Content: Thought content normal.        Judgment: Judgment normal.       No results found for any visits on 05/26/19.  Assessment & Plan      1. Hypercholesterolemia Has tried multiple statins and had myalgias and arthralgias. Does have really high LDL. Patient was given samples of Livalo 4mg . Will try for 2 weeks. If tolerating will send in and then recheck cholesterol in 304 months after using to see if improves. She agrees.   2. Cyst of tendon sheath Suspect more a cyst over the tendon sheath of the 4th finger right hand, vs possible early nodule over A1 pulley for trigger finger. Will refer to Dr. Peggye Ley as below. 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  - Ambulatory referral to Orthopedic Surgery  3. Hemorrhoids, unspecified hemorrhoid type Ongoing x 2 weeks. Will try Anusol suppository as below. If not resolved she is to return for further evaluation.  - hydrocortisone (ANUSOL-HC) 25 MG suppository; Place 1 suppository (25 mg total) rectally 2 (two) times daily.  Dispense: 12 suppository; Refill: 0  4. History of thyroid cancer Noted fullness over left side of neck with h/o thyroid cancer. Will get [place] as below to r/o abnormalities or recurrence. Will f/u pending results.  - US Soft Tissue Head/Neck (NON-THYROID); Future - US THYROID; Future  5. Mass of left side of neck See above medical treatment plan. - US Soft Tissue Head/Neck (NON-THYROID); Future - US THYROID; Future  6. Lightheaded Patient will monitor symptoms and see if she can equate any movements or other cause. Since it is still quite rare we will monitor. If starts occurring more frequently or becomes more intense she will call for further evaluation.   No follow-ups on file.      Reynolds Bowl, PA-C, have reviewed all documentation for this visit. The documentation on 05/28/19 for the exam, diagnosis, procedures, and orders are all accurate and complete.   Rubye Beach  [facility] [phone] (phone) [phone] (fax)  [place]

## 2019-05-28 ENCOUNTER — Encounter: Payer: Self-pay | Admitting: Physician Assistant

## 2019-06-03 ENCOUNTER — Ambulatory Visit
Admission: RE | Admit: 2019-06-03 | Discharge: 2019-06-03 | Disposition: A | Discharge status: Home / Self Care | Payer: Medicare PPO | Source: Ambulatory Visit | Attending: Physician Assistant | Admitting: Physician Assistant

## 2019-06-03 ENCOUNTER — Other Ambulatory Visit: Payer: Self-pay

## 2019-06-03 ENCOUNTER — Encounter: Payer: Self-pay | Admitting: Physician Assistant

## 2019-06-03 DIAGNOSIS — Z85850 Personal history of malignant neoplasm of thyroid: Secondary | ICD-10-CM | POA: Diagnosis not present

## 2019-06-03 DIAGNOSIS — R221 Localized swelling, mass and lump, neck: Secondary | ICD-10-CM | POA: Diagnosis present

## 2019-06-03 DIAGNOSIS — E782 Mixed hyperlipidemia: Secondary | ICD-10-CM

## 2019-06-04 ENCOUNTER — Telehealth: Payer: Self-pay

## 2019-06-04 MED ORDER — LIVALO 4 MG PO TABS: 1.0000 | ORAL_TABLET | Freq: Every day | ORAL | 1 refills | Status: DC

## 2019-06-04 NOTE — Telephone Encounter (Signed)
LMTCB 06/04/2019.  PEC please advise pt of imaging below.   Thanks,   -Mickel Baas

## 2019-06-04 NOTE — Telephone Encounter (Signed)
Pt returned call, result note read; pt verbalizes understanding.  °

## 2019-06-04 NOTE — Telephone Encounter (Signed)
-----   Message from Mar Daring, [place] sent at 06/04/2019 10:16 AM EDT ----- Thyroid US is clear. No residual tissue remains from thyroidectomy. No enlarged lymph nodes.

## 2019-06-04 NOTE — Telephone Encounter (Signed)
-----   Message from Mar Daring, [place] sent at 06/04/2019 10:15 AM EDT ----- The area of fullness noted on the left side is normal. No mass or enlarged lymph nodes. Suspect just thickening of the sternocleidomastoid muscle as discussed.

## 2019-06-09 ENCOUNTER — Other Ambulatory Visit: Payer: Self-pay | Admitting: Physician Assistant

## 2019-06-09 DIAGNOSIS — E559 Vitamin D deficiency, unspecified: Secondary | ICD-10-CM

## 2019-06-09 NOTE — Telephone Encounter (Signed)
Requested medication (s) are due for refill today: yes  Requested medication (s) are on the active medication list: yes  Last refill:  03/19/2019  Future visit scheduled: yes  Notes to clinic:    50,000 IU strengths are not delegated     Requested Prescriptions  Pending Prescriptions Disp Refills   Vitamin D, Ergocalciferol, (DRISDOL) 1.25 MG (50000 UNIT) CAPS capsule [Pharmacy Med Name: VIT D2 (ERGOCAL) 1.25MG (50,000U) CP] 12 capsule 1    Sig: TAKE 1 CAPSULE BY MOUTH EVERY 7 DAYS      Endocrinology:  Vitamins - Vitamin D Supplementation Failed - 06/09/2019 12:12 PM      Failed - 50,000 IU strengths are not delegated      Failed - Phosphate in normal range and within 360 days    No results found for: PHOS        Passed - Ca in normal range and within 360 days    Calcium  Date Value Ref Range Status  08/22/2018 9.1 8.7 - 10.3 mg/dL Final          Passed - Vitamin D in normal range and within 360 days    Vit D, 25-Hydroxy  Date Value Ref Range Status  08/22/2018 36.1 30.0 - 100.0 ng/mL Final    Comment:    Vitamin D deficiency has been defined by the Institute of Medicine and an Endocrine Society practice guideline as a level of serum 25-OH vitamin D less than 20 ng/mL (1,2). The Endocrine Society went on to further define vitamin D insufficiency as a level between 21 and 29 ng/mL (2). 1. IOM (Institute of Medicine). 2010. Dietary reference    intakes for calcium and D. [place]: The    [employer]. 2. Holick MF, Binkley [place], Bischoff-Ferrari HA, et al.    Evaluation, treatment, and prevention of vitamin D    deficiency: an Endocrine Society clinical practice    guideline. JCEM. 2011 Jul; 96(7):1911-30.           Passed - Valid encounter within last 12 months    Recent Outpatient Visits           2 weeks ago Hypercholesterolemia   [facility] [place], Clearnce Sorrel, [place]   9 months ago Annual physical exam   [place], [place]   1 year ago Restless leg syndrome   [place], Clearnce Sorrel, [place]   1 year ago Annual physical exam   [place], Clearnce Sorrel, [place]   2 years ago Annual physical exam   [place], Clearnce Sorrel, PA-C       Future Appointments             In 68 month [employer], [place]

## 2019-07-04 ENCOUNTER — Encounter: Payer: Self-pay | Admitting: Physician Assistant

## 2019-07-08 NOTE — Progress Notes (Signed)
Subjective:   Erica Pineda is a 69 y.o. female who presents for Medicare Annual (Subsequent) preventive examination.  I connected with Marguerita Kellenberger today by telephone and verified that I am speaking with the correct person using two identifiers. Location patient: home Location provider: work Persons participating in the virtual visit: patient, provider.   I discussed the limitations, risks, security and privacy concerns of performing an evaluation and management service by telephone and the availability of in person appointments. I also discussed with the patient that there may be a patient responsible charge related to this service. The patient expressed understanding and verbally consented to this telephonic visit.    Interactive audio and video telecommunications were attempted between this provider and patient, however failed, due to patient having technical difficulties OR patient did not have access to video capability.  We continued and completed visit with audio only.  Review of Systems:  N/A  Cardiac Risk Factors include: advanced age (>66men, >42 women);dyslipidemia;obesity (BMI >30kg/m2)     Objective:     Vitals: There were no vitals taken for this visit.  There is no height or weight on file to calculate BMI.  Advanced Directives 07/09/2019 07/08/2018 07/05/2017 08/10/2016 08/06/2014 08/06/2014  Does Patient Have a Medical Advance Directive? Yes Yes Yes Yes - Yes  Type of Advance Directive [place];Living will [place];Living will [place];Living will Healthcare Power of [place] of [place] will  Copy of [place] in Chart? No - copy requested No - copy requested No - copy requested - - -    Tobacco Social History   Tobacco Use  Smoking Status Never Smoker  Smokeless Tobacco Never Used     Counseling given: Not Answered   Clinical Intake:  Pre-visit preparation completed:  Yes  Pain : No/denies pain Pain Score: 0-No pain     Nutritional Risks: None Diabetes: No  How often do you need to have someone help you when you read instructions, pamphlets, or other written materials from your doctor or pharmacy?: 1 - Never  Interpreter Needed?: No  Information entered by :: [facility], LPN  Past Medical History:  Diagnosis Date  . Cancer ([place])    Thyroid-follwed by Endocrine  . Hyperlipidemia    Past Surgical History:  Procedure Laterality Date  . ABDOMINAL HYSTERECTOMY    . APPENDECTOMY  1996  . BUNIONECTOMY  05/2007   and hammer-toe  . CYSTOCELE REPAIR  1989  . HERNIA REPAIR Left 1993  . HYMENECTOMY  1971  . TONSILLECTOMY AND ADENOIDECTOMY  1972  . TOTAL THYROIDECTOMY  02/08/2009   Family History  Problem Relation Age of Onset  . Breast cancer Sister   . Diabetes Sister   . Osteoporosis Mother   . Stroke Mother        in 2014  . Alzheimer's disease Father   . Diabetes Father   . Diabetes Sister   . Breast cancer Maternal Aunt   . Hypertension Other   . Hyperlipidemia Other   . Heart disease Other   . Diabetes Other   . Alzheimer's disease Other    Social History   Socioeconomic History  . Marital status: Married    Spouse name: Annie Main  . Number of children: 3  . Years of education: 59  . Highest education level: Master's degree (e.g., MA, MS, MEng, MEd, MSW, MBA)  Occupational History  . Occupation: Publishing rights manager: [employer]  Comment: Customer service manager    Comment: retired  Tobacco Use  . Smoking status: Never Smoker  . Smokeless tobacco: Never Used  Substance and Sexual Activity  . Alcohol use: Yes    Alcohol/week: 1.0 - 2.0 standard drinks    Types: 1 - 2 Glasses of wine per week    Comment: occasional  . Drug use: No  . Sexual activity: Not on file  Other Topics Concern  . Not on file  Social History Narrative  . Not on file   Social Determinants of Health   Financial Resource Strain: Low Risk   .  Difficulty of Paying Living Expenses: Not hard at all  Food Insecurity: No Food Insecurity  . Worried About Charity fundraiser in the Last Year: Never true  . Ran Out of Food in the Last Year: Never true  Transportation Needs: No Transportation Needs  . Lack of Transportation (Medical): No  . Lack of Transportation (Non-Medical): No  Physical Activity: Sufficiently Active  . Days of Exercise per Week: 5 days  . Minutes of Exercise per Session: 30 min  Stress: No Stress Concern Present  . Feeling of Stress : Not at all  Social Connections: Not Isolated  . Frequency of Communication with Friends and Family: More than three times a week  . Frequency of Social Gatherings with Friends and Family: More than three times a week  . Attends Religious Services: More than 4 times per year  . Active Member of Clubs or Organizations: Yes  . Attends Archivist Meetings: More than 4 times per year  . Marital Status: Married    Outpatient Encounter Medications as of 07/09/2019  Medication Sig  . aspirin 81 MG EC tablet Take 81 mg by mouth daily. Swallow whole.  . Biotin 5 MG TABS Take by mouth daily.  . Cetirizine HCl 10 MG CAPS Take 1 capsule by mouth as needed. Reported on 06/30/2015  . Cholecalciferol (VITAMIN D3) 1000 units CAPS Take by mouth daily.   Marland Kitchen econazole nitrate 1 % cream Apply 1 application topically 2 (two) times daily.  . Glucos-Chond-Hyal Ac-Ca Fructo (MOVE FREE JOINT HEALTH ADVANCE PO) Take 2 tablets by mouth daily.  Marland Kitchen levothyroxine (SYNTHROID) 125 MCG tablet Take 1 tablet (125 mcg total) by mouth daily before breakfast. Takes 1/2 tablet twice weekly (Patient taking differently: Take 125 mcg by mouth as directed. Takes 1 tablet daily x 5 days then 1/2 tablet on Saturday and Sunday)  . Misc Natural Products (ELDERBERRY ZINC/VIT C/IMMUNE MT) Take 200 mg by mouth daily.  . naproxen (NAPROSYN) 500 MG tablet Take 1 tablet (500 mg total) by mouth 2 (two) times daily with a meal.  (Patient taking differently: Take 500 mg by mouth 2 (two) times daily with a meal. As needed)  . Vitamin D, Ergocalciferol, (DRISDOL) 1.25 MG (50000 UNIT) CAPS capsule TAKE 1 CAPSULE BY MOUTH EVERY 7 DAYS  . hydrocortisone (ANUSOL-HC) 25 MG suppository Place 1 suppository (25 mg total) rectally 2 (two) times daily. (Patient not taking: Reported on 07/09/2019)  . MULTIPLE VITAMINS-MINERALS PO Take by mouth daily.   . Pitavastatin Calcium (LIVALO) 4 MG TABS Take 1 tablet (4 mg total) by mouth daily. (Patient not taking: Reported on 07/09/2019)   No facility-administered encounter medications on file as of 07/09/2019.    Activities of Daily Living In your present state of health, do you have any difficulty performing the following activities: 07/09/2019  Hearing? N  Vision? N  Difficulty concentrating  or making decisions? N  Walking or climbing stairs? N  Dressing or bathing? N  Doing errands, shopping? N  Preparing Food and eating ? N  Using the Toilet? N  In the past six months, have you accidently leaked urine? Y  Comment Had bladder tact previously. Has had more leakage recently.  Do you have problems with loss of bowel control? N  Managing your Medications? N  Managing your Finances? N  Housekeeping or managing your Housekeeping? N  Some recent data might be hidden    Patient Care Team: Mar Daring, PA-C as PCP - General (Family Medicine) Beverly Gust, MD as Consulting Physician (Otolaryngology) Albertine Patricia, DPM as Attending Physician (Podiatry) Ubaldo Glassing Javier Docker, MD as Consulting Physician (Cardiology) Iris Pert, [place] as Referring Physician (Chiropractic Medicine)    Assessment:   This is a routine wellness examination for [place].  Exercise Activities and Dietary recommendations Current Exercise Habits: Home exercise routine, Type of exercise: walking;stretching;strength training/weights, Time (Minutes): 30(to 45 minutes), Frequency (Times/Week): 5, Weekly Exercise  (Minutes/Week): 150, Intensity: Mild, Exercise limited by: None identified  Goals    . Reduce portion size     Recommend to decrease portion sizes by eating 3 small healthy meals and at least 2 healthy snacks per day.       Fall Risk: Fall Risk  07/09/2019 07/08/2018 07/05/2017 08/10/2016 02/09/2016  Falls in the past year? 0 0 No No No  Number falls in past yr: 0 - - - -  Injury with Fall? 0 - - - -    FALL RISK PREVENTION PERTAINING TO THE HOME:  Any stairs in or around the home? Yes  If so, are there any without handrails? No   Home free of loose throw rugs in walkways, pet beds, electrical cords, etc? Yes  Adequate lighting in your home to reduce risk of falls? Yes   ASSISTIVE DEVICES UTILIZED TO PREVENT FALLS:  Life alert? No  Use of a cane, walker or w/c? No  Grab bars in the bathroom? Yes  Shower chair or bench in shower? No  Elevated toilet seat or a handicapped toilet? No    TIMED UP AND GO:  Was the test performed? No .    Depression Screen PHQ 2/9 Scores 07/09/2019 07/08/2018 07/05/2017 08/10/2016  PHQ - 2 Score 0 0 0 0     Cognitive Function     6CIT Screen 07/09/2019 07/08/2018 07/05/2017  What Year? 0 points 0 points 0 points  What month? 0 points 0 points 0 points  What time? 0 points 0 points 0 points  Count back from 20 0 points 0 points 0 points  Months in reverse 0 points 0 points 0 points  Repeat phrase 0 points 0 points 0 points  Total Score 0 0 0    Immunization History  Administered Date(s) Administered  . Fluad Quad(high Dose 65+) 11/06/2018  . Influenza, High Dose Seasonal PF 01/26/2018  . Influenza,inj,Quad PF,6+ Mos 12/16/2012  . Pneumococcal Conjugate-13 08/10/2016  . Pneumococcal Polysaccharide-23 08/21/2017  . Tdap 03/31/2010  . Zoster Recombinat (Shingrix) 08/10/2016, 10/10/2016    Qualifies for Shingles Vaccine? Completed series  Tdap: Up to date  Flu Vaccine: Up to date  Pneumococcal Vaccine: Completed series  Screening  Tests Health Maintenance  Topic Date Due  . COVID-19 Vaccine (1) Never done  . MAMMOGRAM  08/30/2019  . INFLUENZA VACCINE  08/31/2019  . TETANUS/TDAP  03/30/2020  . DEXA SCAN  08/17/2021  . COLONOSCOPY  01/30/2027  .  Hepatitis C Screening  Completed  . PNA vac Low Risk Adult  Completed    Cancer Screenings:  Colorectal Screening: Completed 01/29/17. Repeat every 10 years.   Mammogram: Completed 08/30/18. Repeat every 1-2 years as advised.   Bone Density: Completed 08/17/16. Results reflect OSTEOPENIA. Repeat every 5 years.   Lung Cancer Screening: (Low Dose CT Chest recommended if Age 30-80 years, 30 pack-year currently smoking OR have quit w/in 15years.) does not qualify.   Additional Screening:  Hepatitis C Screening: Up to date  Vision Screening: Recommended annual ophthalmology exams for early detection of glaucoma and other disorders of the eye.  Dental Screening: Recommended annual dental exams for proper oral hygiene  Community Resource Referral:  CRR required this visit? No     Plan:  I have personally reviewed and addressed the Medicare Annual Wellness questionnaire and have noted the following in the patient's chart:  A. Medical and social history B. Use of alcohol, tobacco or illicit drugs  C. Current medications and supplements D. Functional ability and status E.  Nutritional status F.  Physical activity G. Advance directives H. List of other physicians I.  Hospitalizations, surgeries, and ER visits in previous 12 months J.  [place] such as hearing and vision if needed, cognitive and depression L. Referrals and appointments   In addition, I have reviewed and discussed with patient certain preventive protocols, quality metrics, and best practice recommendations. A written personalized care plan for preventive services as well as general preventive health recommendations were provided to patient. Nurse Health Advisor   Signed,    Senora Lacson  [place], [place]  10/04/720 Nurse Health Advisor   Nurse Notes: None.

## 2019-07-09 ENCOUNTER — Other Ambulatory Visit: Payer: Self-pay

## 2019-07-09 ENCOUNTER — Ambulatory Visit (INDEPENDENT_AMBULATORY_CARE_PROVIDER_SITE_OTHER): Payer: Medicare PPO

## 2019-07-09 DIAGNOSIS — Z0000 Encounter for general adult medical examination without abnormal findings: Secondary | ICD-10-CM | POA: Diagnosis not present

## 2019-07-09 NOTE — Patient Instructions (Signed)
Erica Pineda , Thank you for taking time to come for your Medicare Wellness Visit. I appreciate your ongoing commitment to your health goals. Please review the following plan we discussed and let me know if I can assist you in the future.   Screening recommendations/referrals: Colonoscopy: Up to date, due 12/2026 Mammogram: Up to date, due 07/2020 Bone Density: Up to date, due 07/2021 Recommended yearly ophthalmology/optometry visit for glaucoma screening and checkup Recommended yearly dental visit for hygiene and checkup  Vaccinations: Influenza vaccine: Up to date Pneumococcal vaccine: Completed series Tdap vaccine: Up to date, due 03/2020 Shingles vaccine: Completed series    Advanced directives: Please bring a copy of your POA (Power of [place]) and/or Living Will to your next appointment.   Conditions/risks identified: Recommend to decrease portion sizes by eating 3 small healthy meals and at least 2 healthy snacks per day.  Next appointment: 09/08/19 @ 2:20 PM with [place] 68 Years and Older, Female Preventive care refers to lifestyle choices and visits with your health care provider that can promote health and wellness. What does preventive care include?  A yearly physical exam. This is also called an annual well check.  Dental exams once or twice a year.  Routine eye exams. Ask your health care provider how often you should have your eyes checked.  Personal lifestyle choices, including:  Daily care of your teeth and gums.  Regular physical activity.  Eating a healthy diet.  Avoiding tobacco and drug use.  Limiting alcohol use.  Practicing safe sex.  Taking low-dose aspirin every day.  Taking vitamin and mineral supplements as recommended by your health care provider. What happens during an annual well check? The services and screenings done by your health care provider during your annual well check will depend on your age, overall health,  lifestyle risk factors, and family history of disease. Counseling  Your health care provider may ask you questions about your:  Alcohol use.  Tobacco use.  Drug use.  Emotional well-being.  Home and relationship well-being.  Sexual activity.  Eating habits.  History of falls.  Memory and ability to understand (cognition).  Work and work Statistician.  Reproductive health. Screening  You may have the following tests or measurements:  Height, weight, and BMI.  Blood pressure.  Lipid and cholesterol levels. These may be checked every 5 years, or more frequently if you are over 29 years old.  Skin check.  Lung cancer screening. You may have this screening every year starting at age 46 if you have a 30-pack-year history of smoking and currently smoke or have quit within the past 15 years.  Fecal occult blood test (FOBT) of the stool. You may have this test every year starting at age 68.  Flexible sigmoidoscopy or colonoscopy. You may have a sigmoidoscopy every 5 years or a colonoscopy every 10 years starting at age 74.  Hepatitis C blood test.  Hepatitis B blood test.  Sexually transmitted disease (STD) testing.  Diabetes screening. This is done by checking your blood sugar (glucose) after you have not eaten for a while (fasting). You may have this done every 1-3 years.  Bone density scan. This is done to screen for osteoporosis. You may have this done starting at age 88.  Mammogram. This may be done every 1-2 years. Talk to your health care provider about how often you should have regular mammograms. Talk with your health care provider about your test results, treatment options, and if  necessary, the need for more tests. Vaccines  Your health care provider may recommend certain vaccines, such as:  Influenza vaccine. This is recommended every year.  Tetanus, diphtheria, and acellular pertussis (Tdap, Td) vaccine. You may need a Td booster every 10 years.  Zoster  vaccine. You may need this after age 24.  Pneumococcal 13-valent conjugate (PCV13) vaccine. One dose is recommended after age 27.  Pneumococcal polysaccharide (PPSV23) vaccine. One dose is recommended after age 23. Talk to your health care provider about which screenings and vaccines you need and how often you need them. This information is not intended to replace advice given to you by your health care provider. Make sure you discuss any questions you have with your health care provider. Document Released: 02/12/2015 Document Revised: 10/06/2015 Document Reviewed: 11/17/2014 Elsevier Interactive Patient Education  2017 [place] Prevention in the Home Falls can cause injuries. They can happen to people of all ages. There are many things you can do to make your home safe and to help prevent falls. What can I do on the outside of my home?  Regularly fix the edges of walkways and driveways and fix any cracks.  Remove anything that might make you trip as you walk through a door, such as a raised step or threshold.  Trim any bushes or trees on the path to your home.  Use bright outdoor lighting.  Clear any walking paths of anything that might make someone trip, such as rocks or tools.  Regularly check to see if handrails are loose or broken. Make sure that both sides of any steps have handrails.  Any raised decks and porches should have guardrails on the edges.  Have any leaves, snow, or ice cleared regularly.  Use sand or salt on walking paths during winter.  Clean up any spills in your garage right away. This includes oil or grease spills. What can I do in the bathroom?  Use night lights.  Install grab bars by the toilet and in the tub and shower. Do not use towel bars as grab bars.  Use non-skid mats or decals in the tub or shower.  If you need to sit down in the shower, use a plastic, non-slip stool.  Keep the floor dry. Clean up any water that spills on the  floor as soon as it happens.  Remove soap buildup in the tub or shower regularly.  Attach bath mats securely with double-sided non-slip rug tape.  Do not have throw rugs and other things on the floor that can make you trip. What can I do in the bedroom?  Use night lights.  Make sure that you have a light by your bed that is easy to reach.  Do not use any sheets or blankets that are too big for your bed. They should not hang down onto the floor.  Have a firm chair that has side arms. You can use this for support while you get dressed.  Do not have throw rugs and other things on the floor that can make you trip. What can I do in the kitchen?  Clean up any spills right away.  Avoid walking on wet floors.  Keep items that you use a lot in easy-to-reach places.  If you need to reach something above you, use a strong step stool that has a grab bar.  Keep electrical cords out of the way.  Do not use floor polish or wax that makes floors slippery. If you must  use wax, use non-skid floor wax.  Do not have throw rugs and other things on the floor that can make you trip. What can I do with my stairs?  Do not leave any items on the stairs.  Make sure that there are handrails on both sides of the stairs and use them. Fix handrails that are broken or loose. Make sure that handrails are as long as the stairways.  Check any carpeting to make sure that it is firmly attached to the stairs. Fix any carpet that is loose or worn.  Avoid having throw rugs at the top or bottom of the stairs. If you do have throw rugs, attach them to the floor with carpet tape.  Make sure that you have a light switch at the top of the stairs and the bottom of the stairs. If you do not have them, ask someone to add them for you. What else can I do to help prevent falls?  Wear shoes that:  Do not have high heels.  Have rubber bottoms.  Are comfortable and fit you well.  Are closed at the toe. Do not wear  sandals.  If you use a stepladder:  Make sure that it is fully opened. Do not climb a closed stepladder.  Make sure that both sides of the stepladder are locked into place.  Ask someone to hold it for you, if possible.  Clearly mark and make sure that you can see:  Any grab bars or handrails.  First and last steps.  Where the edge of each step is.  Use tools that help you move around (mobility aids) if they are needed. These include:  Canes.  Walkers.  Scooters.  Crutches.  Turn on the lights when you go into a dark area. Replace any light bulbs as soon as they burn out.  Set up your furniture so you have a clear path. Avoid moving your furniture around.  If any of your floors are uneven, fix them.  If there are any pets around you, be aware of where they are.  Review your medicines with your doctor. Some medicines can make you feel dizzy. This can increase your chance of falling. Ask your doctor what other things that you can do to help prevent falls. This information is not intended to replace advice given to you by your health care provider. Make sure you discuss any questions you have with your health care provider. Document Released: 11/12/2008 Document Revised: 06/24/2015 Document Reviewed: 02/20/2014 Elsevier Interactive Patient Education  2017 [employer].

## 2019-08-05 DIAGNOSIS — E6609 Other obesity due to excess calories: Secondary | ICD-10-CM | POA: Insufficient documentation

## 2019-08-05 DIAGNOSIS — E782 Mixed hyperlipidemia: Secondary | ICD-10-CM | POA: Insufficient documentation

## 2019-08-05 DIAGNOSIS — E66811 Obesity, class 1: Secondary | ICD-10-CM | POA: Insufficient documentation

## 2019-09-08 ENCOUNTER — Ambulatory Visit (INDEPENDENT_AMBULATORY_CARE_PROVIDER_SITE_OTHER): Payer: Medicare PPO | Admitting: Physician Assistant

## 2019-09-08 ENCOUNTER — Encounter: Payer: Self-pay | Admitting: Physician Assistant

## 2019-09-08 ENCOUNTER — Other Ambulatory Visit: Payer: Self-pay

## 2019-09-08 VITALS — BP 115/77 | HR 81 | Temp 97.9°F | Resp 16 | Ht 68.0 in | Wt 222.4 lb

## 2019-09-08 DIAGNOSIS — Z803 Family history of malignant neoplasm of breast: Secondary | ICD-10-CM

## 2019-09-08 DIAGNOSIS — E559 Vitamin D deficiency, unspecified: Secondary | ICD-10-CM

## 2019-09-08 DIAGNOSIS — Z0000 Encounter for general adult medical examination without abnormal findings: Secondary | ICD-10-CM | POA: Diagnosis not present

## 2019-09-08 DIAGNOSIS — T466X5A Adverse effect of antihyperlipidemic and antiarteriosclerotic drugs, initial encounter: Secondary | ICD-10-CM

## 2019-09-08 DIAGNOSIS — E039 Hypothyroidism, unspecified: Secondary | ICD-10-CM

## 2019-09-08 DIAGNOSIS — Z85850 Personal history of malignant neoplasm of thyroid: Secondary | ICD-10-CM | POA: Diagnosis not present

## 2019-09-08 DIAGNOSIS — R739 Hyperglycemia, unspecified: Secondary | ICD-10-CM

## 2019-09-08 DIAGNOSIS — Z1239 Encounter for other screening for malignant neoplasm of breast: Secondary | ICD-10-CM | POA: Diagnosis not present

## 2019-09-08 DIAGNOSIS — M791 Myalgia, unspecified site: Secondary | ICD-10-CM

## 2019-09-08 DIAGNOSIS — E7800 Pure hypercholesterolemia, unspecified: Secondary | ICD-10-CM

## 2019-09-08 NOTE — Patient Instructions (Signed)
Health Maintenance After Age 68 After age 68, you are at a higher risk for certain long-term diseases and infections as well as injuries from falls. Falls are a major cause of broken bones and head injuries in people who are older than age 68. Getting regular preventive care can help to keep you healthy and well. Preventive care includes getting regular testing and making lifestyle changes as recommended by your health care provider. Talk with your health care provider about:  Which screenings and tests you should have. A screening is a test that checks for a disease when you have no symptoms.  A diet and exercise plan that is right for you. What should I know about screenings and tests to prevent falls? Screening and testing are the best ways to find a health problem early. Early diagnosis and treatment give you the best chance of managing medical conditions that are common after age 68. Certain conditions and lifestyle choices may make you more likely to have a fall. Your health care provider may recommend:  Regular vision checks. Poor vision and conditions such as cataracts can make you more likely to have a fall. If you wear glasses, make sure to get your prescription updated if your vision changes.  Medicine review. Work with your health care provider to regularly review all of the medicines you are taking, including over-the-counter medicines. Ask your health care provider about any side effects that may make you more likely to have a fall. Tell your health care provider if any medicines that you take make you feel dizzy or sleepy.  Osteoporosis screening. Osteoporosis is a condition that causes the bones to get weaker. This can make the bones weak and cause them to break more easily.  Blood pressure screening. Blood pressure changes and medicines to control blood pressure can make you feel dizzy.  Strength and balance checks. Your health care provider may recommend certain tests to check your  strength and balance while standing, walking, or changing positions.  Foot health exam. Foot pain and numbness, as well as not wearing proper footwear, can make you more likely to have a fall.  Depression screening. You may be more likely to have a fall if you have a fear of falling, feel emotionally low, or feel unable to do activities that you used to do.  Alcohol use screening. Using too much alcohol can affect your balance and may make you more likely to have a fall. What actions can I take to lower my risk of falls? General instructions  Talk with your health care provider about your risks for falling. Tell your health care provider if: ? You fall. Be sure to tell your health care provider about all falls, even ones that seem minor. ? You feel dizzy, sleepy, or off-balance.  Take over-the-counter and prescription medicines only as told by your health care provider. These include any supplements.  Eat a healthy diet and maintain a healthy weight. A healthy diet includes low-fat dairy products, low-fat (lean) meats, and fiber from whole grains, beans, and lots of fruits and vegetables. Home safety  Remove any tripping hazards, such as rugs, cords, and clutter.  Install safety equipment such as grab bars in bathrooms and safety rails on stairs.  Keep rooms and walkways well-lit. Activity   Follow a regular exercise program to stay fit. This will help you maintain your balance. Ask your health care provider what types of exercise are appropriate for you.  If you need a cane or   walker, use it as recommended by your health care provider.  Wear supportive shoes that have nonskid soles. Lifestyle  Do not drink alcohol if your health care provider tells you not to drink.  If you drink alcohol, limit how much you have: ? 0-1 drink a day for women. ? 0-2 drinks a day for men.  Be aware of how much alcohol is in your drink. In the [place] one drink equals one typical bottle of beer (12  oz), one-half glass of wine (5 oz), or one shot of hard liquor (1 oz).  Do not use any products that contain nicotine or tobacco, such as cigarettes and e-cigarettes. If you need help quitting, ask your health care provider. Summary  Having a healthy lifestyle and getting preventive care can help to protect your health and wellness after age 68.  Screening and testing are the best way to find a health problem early and help you avoid having a fall. Early diagnosis and treatment give you the best chance for managing medical conditions that are more common for people who are older than age 68.  Falls are a major cause of broken bones and head injuries in people who are older than age 68. Take precautions to prevent a fall at home.  Work with your health care provider to learn what changes you can make to improve your health and wellness and to prevent falls. This information is not intended to replace advice given to you by your health care provider. Make sure you discuss any questions you have with your health care provider. Document Revised: 05/09/2018 Document Reviewed: 11/29/2016 Elsevier Patient Education  2020 Elsevier Inc.  

## 2019-09-08 NOTE — Progress Notes (Signed)
Complete physical exam   Patient: Erica Pineda   DOB: January 31, 1951   69 y.o. Female  MRN: [id number] Visit Date: 09/08/2019  Today's healthcare provider: Mar Daring, PA-C   Chief Complaint  Patient presents with  . Annual Exam   Subjective    Erica Pineda is a 69 y.o. female who presents today for a complete physical exam.  She reports consuming a general diet. Home exercise routine includes walks. She generally feels well. She reports sleeping well. She does not have additional problems to discuss today.  HPI  Patient had AWV 07/09/19 with [facility] She is scheduled for Mammogram 09/18/19 at [facility].  Past Medical History:  Diagnosis Date  . Cancer ([facility])    Thyroid-follwed by Endocrine  . Hyperlipidemia    Past Surgical History:  Procedure Laterality Date  . ABDOMINAL HYSTERECTOMY    . APPENDECTOMY  1996  . BUNIONECTOMY  05/2007   and hammer-toe  . CYSTOCELE REPAIR  1989  . HERNIA REPAIR Left 1993  . HYMENECTOMY  1971  . TONSILLECTOMY AND ADENOIDECTOMY  1972  . TOTAL THYROIDECTOMY  02/08/2009   Social History   Socioeconomic History  . Marital status: Married    Spouse name: Annie Main  . Number of children: 3  . Years of education: 42  . Highest education level: Master's degree (e.g., MA, MS, MEng, MEd, MSW, MBA)  Occupational History  . Occupation: Publishing rights manager: [employer]    Comment: Customer service manager    Comment: retired  Tobacco Use  . Smoking status: Never Smoker  . Smokeless tobacco: Never Used  Vaping Use  . Vaping Use: Never used  Substance and Sexual Activity  . Alcohol use: Yes    Alcohol/week: 1.0 - 2.0 standard drink    Types: 1 - 2 Glasses of wine per week    Comment: occasional  . Drug use: No  . Sexual activity: Not on file  Other Topics Concern  . Not on file  Social History Narrative  . Not on file   Social Determinants of Health   Financial Resource Strain: Low Risk   . Difficulty of Paying Living Expenses: Not hard at all  Food  Insecurity: No Food Insecurity  . Worried About Charity fundraiser in the Last Year: Never true  . Ran Out of Food in the Last Year: Never true  Transportation Needs: No Transportation Needs  . Lack of Transportation (Medical): No  . Lack of Transportation (Non-Medical): No  Physical Activity: Sufficiently Active  . Days of Exercise per Week: 5 days  . Minutes of Exercise per Session: 30 min  Stress: No Stress Concern Present  . Feeling of Stress : Not at all  Social Connections: Socially Integrated  . Frequency of Communication with Friends and Family: More than three times a week  . Frequency of Social Gatherings with Friends and Family: More than three times a week  . Attends Religious Services: More than 4 times per year  . Active Member of Clubs or Organizations: Yes  . Attends Archivist Meetings: More than 4 times per year  . Marital Status: Married  Human resources officer Violence: Not At Risk  . Fear of Current or Ex-Partner: No  . Emotionally Abused: No  . Physically Abused: No  . Sexually Abused: No   Family Status  Relation Name Status  . Sister  Alive  . Mother  Deceased  . Father  Deceased  . Brother  Alive  .  Sister  Alive  . Mat Aunt  (Not Specified)  . Other  (Not Specified)   Family History  Problem Relation Age of Onset  . Breast cancer Sister   . Diabetes Sister   . Osteoporosis Mother   . Stroke Mother        in 2014  . Alzheimer's disease Father   . Diabetes Father   . Diabetes Sister   . Breast cancer Maternal Aunt   . Hypertension Other   . Hyperlipidemia Other   . Heart disease Other   . Diabetes Other   . Alzheimer's disease Other    Allergies  Allergen Reactions  . Percodan  [Oxycodone-Aspirin]   . Simvastatin Swelling    Other reaction(s): Muscle Pain    Patient Care Team: Mar Daring, PA-C as PCP - General (Family Medicine) Beverly Gust, MD as Consulting Physician (Otolaryngology) Albertine Patricia, DPM as  Attending Physician (Podiatry) Ubaldo Glassing Javier Docker, MD as Consulting Physician (Cardiology) Iris Pert, DC as Referring Physician (Chiropractic Medicine)   Medications: Outpatient Medications Prior to Visit  Medication Sig  . aspirin 81 MG EC tablet Take 81 mg by mouth daily. Swallow whole.  . Biotin 5 MG TABS Take by mouth daily.  . Cetirizine HCl 10 MG CAPS Take 1 capsule by mouth as needed. Reported on 06/30/2015  . Cholecalciferol (VITAMIN D3) 1000 units CAPS Take by mouth daily.   Marland Kitchen econazole nitrate 1 % cream Apply 1 application topically 2 (two) times daily.  . hydrocortisone (ANUSOL-HC) 25 MG suppository Place 1 suppository (25 mg total) rectally 2 (two) times daily.  Marland Kitchen levothyroxine (SYNTHROID) 125 MCG tablet Take 1 tablet (125 mcg total) by mouth daily before breakfast. Takes 1/2 tablet twice weekly (Patient taking differently: Take 125 mcg by mouth as directed. Takes 1 tablet daily x 5 days then 1/2 tablet on Saturday and Sunday)  . Misc Natural Products (ELDERBERRY ZINC/VIT C/IMMUNE MT) Take 200 mg by mouth daily.  . MULTIPLE VITAMINS-MINERALS PO Take by mouth daily.   . Vitamin D, Ergocalciferol, (DRISDOL) 1.25 MG (50000 UNIT) CAPS capsule TAKE 1 CAPSULE BY MOUTH EVERY 7 DAYS  . Glucos-Chond-Hyal Ac-Ca Fructo (MOVE FREE JOINT HEALTH ADVANCE PO) Take 2 tablets by mouth daily.  . naproxen (NAPROSYN) 500 MG tablet Take 1 tablet (500 mg total) by mouth 2 (two) times daily with a meal. (Patient taking differently: Take 500 mg by mouth 2 (two) times daily with a meal. As needed)  . Pitavastatin Calcium (LIVALO) 4 MG TABS Take 1 tablet (4 mg total) by mouth daily. (Patient not taking: Reported on 07/09/2019)   No facility-administered medications prior to visit.    Review of Systems  Constitutional: Negative.   HENT: Negative.        TMJ  Eyes: Positive for photophobia.  Respiratory: Negative.   Cardiovascular: Negative.   Gastrointestinal: Negative.   Endocrine: Negative.     Genitourinary: Negative.   Musculoskeletal: Negative.   Skin: Negative.   Allergic/Immunologic: Negative.   Neurological: Negative.   Hematological: Negative.   Psychiatric/Behavioral: Negative.        Objective    BP 115/77 (BP Location: Left Arm, Patient Position: Sitting, Cuff Size: Normal)   Pulse 81   Temp 97.9 F (36.6 C) (Oral)   Resp 16   Ht 5\' 8"  (1.727 m)   Wt 222 lb 6.4 oz (100.9 kg)   BMI 33.82 kg/m     Physical Exam Vitals reviewed.  Constitutional:      General:  She is not in acute distress.    Appearance: Normal appearance. She is well-developed. She is obese. She is not ill-appearing or diaphoretic.  HENT:     Head: Normocephalic and atraumatic.     Right Ear: Tympanic membrane, ear canal and external ear normal.     Left Ear: Tympanic membrane, ear canal and external ear normal.     Nose: Nose normal.     Mouth/Throat:     Mouth: Mucous membranes are moist.     Pharynx: Oropharynx is clear. No oropharyngeal exudate.  Eyes:     General: No scleral icterus.       Right eye: No discharge.        Left eye: No discharge.     Extraocular Movements: Extraocular movements intact.     Conjunctiva/sclera: Conjunctivae normal.     Pupils: Pupils are equal, round, and reactive to light.  Neck:     Thyroid: No thyromegaly.     Vascular: No carotid bruit or JVD.     Trachea: No tracheal deviation.  Cardiovascular:     Rate and Rhythm: Normal rate and regular rhythm.     Pulses: Normal pulses.     Heart sounds: Normal heart sounds. No murmur heard.  No friction rub. No gallop.   Pulmonary:     Effort: Pulmonary effort is normal. No respiratory distress.     Breath sounds: Normal breath sounds. No wheezing or rales.  Chest:     Chest wall: No tenderness.  Abdominal:     General: Abdomen is flat. Bowel sounds are normal. There is no distension.     Palpations: Abdomen is soft. There is no mass.     Tenderness: There is no abdominal tenderness. There is no  guarding or rebound.  Musculoskeletal:        General: No tenderness. Normal range of motion.     Cervical back: Normal range of motion and neck supple.     Right lower leg: No edema.     Left lower leg: No edema.  Lymphadenopathy:     Cervical: No cervical adenopathy.  Skin:    General: Skin is warm and dry.     Capillary Refill: Capillary refill takes less than 2 seconds.     Findings: No rash.  Neurological:     General: No focal deficit present.     Mental Status: She is alert and oriented to person, place, and time. Mental status is at baseline.  Psychiatric:        Mood and Affect: Mood normal.        Behavior: Behavior normal.        Thought Content: Thought content normal.        Judgment: Judgment normal.     Last depression screening scores PHQ 2/9 Scores 07/09/2019 07/08/2018 07/05/2017  PHQ - 2 Score 0 0 0   Last fall risk screening Fall Risk  07/09/2019  Falls in the past year? 0  Number falls in past yr: 0  Injury with Fall? 0   Last Audit-C alcohol use screening Alcohol Use Disorder Test (AUDIT) 07/09/2019  1. How often do you have a drink containing alcohol? 2  2. How many drinks containing alcohol do you have on a typical day when you are drinking? 0  3. How often do you have six or more drinks on one occasion? 0  AUDIT-C Score 2  Alcohol Brief Interventions/Follow-up AUDIT Score <7 follow-up not indicated   A score of  3 or more in women, and 4 or more in men indicates increased risk for alcohol abuse, EXCEPT if all of the points are from question 1   No results found for any visits on 09/08/19.  Assessment & Plan    Routine Health Maintenance and Physical Exam  Exercise Activities and Dietary recommendations Goals    . Reduce portion size     Recommend to decrease portion sizes by eating 3 small healthy meals and at least 2 healthy snacks per day.       Immunization History  Administered Date(s) Administered  . Fluad Quad(high Dose 65+) 11/06/2018  .  Influenza, High Dose Seasonal PF 01/26/2018  . Influenza,inj,Quad PF,6+ Mos 12/16/2012  . Pneumococcal Conjugate-13 08/10/2016  . Pneumococcal Polysaccharide-23 08/21/2017  . Tdap 03/31/2010  . Zoster Recombinat (Shingrix) 08/10/2016, 10/10/2016    Health Maintenance  Topic Date Due  . COVID-19 Vaccine (1) Never done  . MAMMOGRAM  08/30/2019  . INFLUENZA VACCINE  08/31/2019  . TETANUS/TDAP  03/30/2020  . DEXA SCAN  08/17/2021  . COLONOSCOPY  01/30/2027  . Hepatitis C Screening  Completed  . PNA vac Low Risk Adult  Completed    Discussed health benefits of physical activity, and encouraged her to engage in regular exercise appropriate for her age and condition.  1. Annual physical exam Normal physical exam today. Will check labs as below and f/u pending lab results. If labs are stable and WNL she will not need to have these rechecked for one year at her next annual physical exam. She is to call the office in the meantime if she has any acute issue, questions or concerns.  2. Encounter for breast cancer screening using non-mammogram modality Breast exam today was normal. There is family history of breast cancer. She does perform regular self breast exams. Mammogram was ordered as below. Information for [facility] clinic was given to patient so she may schedule her mammogram at her convenience..  3. Adult hypothyroidism Stable. Continue Levothyroxine 134mcg. Will check labs as below and f/u pending results. - TSH  4. History of thyroid cancer S/P surgical resection. - TSH  5. Hypercholesteremia Intolerant to statins. Will check labs as below and f/u pending results. Starting Zetia 10mg .  - CBC with Differential/Platelet - Comprehensive metabolic panel - Lipid panel  6. Blood glucose elevated Diet controlled. Will check labs as below and f/u pending results. - CBC with Differential/Platelet - Comprehensive metabolic panel - Hemoglobin A1c  7. Avitaminosis  D Postmenopausal. On high dose supplementation. Will check labs as below and f/u pending results. - CBC with Differential/Platelet - Vitamin D (25 hydroxy)  8. Family history of breast cancer Annual mammograms.   9. Myalgia due to statin Has tried multiple statins, even Livalo and unable to tolerate. Now on Zetia.    No follow-ups on file.     Reynolds Bowl, PA-C, have reviewed all documentation for this visit. The documentation on 09/21/19 for the exam, diagnosis, procedures, and orders are all accurate and complete.   Rubye Beach  [facility] [phone] (phone) [phone] (fax)  [place]

## 2019-09-10 ENCOUNTER — Other Ambulatory Visit: Payer: Self-pay | Admitting: Physician Assistant

## 2019-09-10 DIAGNOSIS — E890 Postprocedural hypothyroidism: Secondary | ICD-10-CM

## 2019-09-11 ENCOUNTER — Encounter: Payer: Self-pay | Admitting: Physician Assistant

## 2019-09-11 DIAGNOSIS — E7800 Pure hypercholesterolemia, unspecified: Secondary | ICD-10-CM

## 2019-09-11 DIAGNOSIS — E890 Postprocedural hypothyroidism: Secondary | ICD-10-CM

## 2019-09-11 LAB — COMPREHENSIVE METABOLIC PANEL
ALT: 16 IU/L (ref 0–32)
AST: 17 IU/L (ref 0–40)
Albumin/Globulin Ratio: 1.9 (ref 1.2–2.2)
Albumin: 4.1 g/dL (ref 3.8–4.8)
Alkaline Phosphatase: 71 IU/L (ref 48–121)
BUN/Creatinine Ratio: 21 (ref 12–28)
BUN: 16 mg/dL (ref 8–27)
Bilirubin Total: 0.3 mg/dL (ref 0.0–1.2)
CO2: 19 mmol/L — ABNORMAL LOW (ref 20–29)
Calcium: 9.3 mg/dL (ref 8.7–10.3)
Chloride: 100 mmol/L (ref 96–106)
Creatinine, Ser: 0.78 mg/dL (ref 0.57–1.00)
GFR calc Af Amer: 90 mL/min/{1.73_m2} (ref >59)
GFR calc non Af Amer: 78 mL/min/{1.73_m2} (ref >59)
Globulin, Total: 2.2 g/dL (ref 1.5–4.5)
Glucose: 98 mg/dL (ref 65–99)
Potassium: 4.8 mmol/L (ref 3.5–5.2)
Sodium: 139 mmol/L (ref 134–144)
Total Protein: 6.3 g/dL (ref 6.0–8.5)

## 2019-09-11 LAB — CBC WITH DIFFERENTIAL/PLATELET
Basophils Absolute: 0.1 10*3/uL (ref 0.0–0.2)
Basos: 1 %
EOS (ABSOLUTE): 0.2 10*3/uL (ref 0.0–0.4)
Eos: 5 %
Hematocrit: 40.8 % (ref 34.0–46.6)
Hemoglobin: 13.6 g/dL (ref 11.1–15.9)
Immature Grans (Abs): 0 10*3/uL (ref 0.0–0.1)
Immature Granulocytes: 0 %
Lymphocytes Absolute: 1.6 10*3/uL (ref 0.7–3.1)
Lymphs: 33 %
MCH: 28.4 pg (ref 26.6–33.0)
MCHC: 33.3 g/dL (ref 31.5–35.7)
MCV: 85 fL (ref 79–97)
Monocytes Absolute: 0.4 10*3/uL (ref 0.1–0.9)
Monocytes: 9 %
Neutrophils Absolute: 2.5 10*3/uL (ref 1.4–7.0)
Neutrophils: 52 %
Platelets: 305 10*3/uL (ref 150–450)
RBC: 4.79 x10E6/uL (ref 3.77–5.28)
RDW: 13.1 % (ref 11.7–15.4)
WBC: 4.9 10*3/uL (ref 3.4–10.8)

## 2019-09-11 LAB — LIPID PANEL
Chol/HDL Ratio: 5.1 ratio — ABNORMAL HIGH (ref 0.0–4.4)
Cholesterol, Total: 276 mg/dL — ABNORMAL HIGH (ref 100–199)
HDL: 54 mg/dL (ref >39)
LDL Chol Calc (NIH): 199 mg/dL — ABNORMAL HIGH (ref 0–99)
Triglycerides: 128 mg/dL (ref 0–149)
VLDL Cholesterol Cal: 23 mg/dL (ref 5–40)

## 2019-09-11 LAB — TSH: TSH: 2.13 u[IU]/mL (ref 0.450–4.500)

## 2019-09-11 LAB — HEMOGLOBIN A1C
Est. average glucose Bld gHb Est-mCnc: 128 mg/dL
Hgb A1c MFr Bld: 6.1 % — ABNORMAL HIGH (ref 4.8–5.6)

## 2019-09-11 LAB — VITAMIN D 25 HYDROXY (VIT D DEFICIENCY, FRACTURES): Vit D, 25-Hydroxy: 47.8 ng/mL (ref 30.0–100.0)

## 2019-09-11 MED ORDER — EZETIMIBE 10 MG PO TABS: 10.0000 mg | ORAL_TABLET | Freq: Every day | ORAL | 0 refills | Status: DC

## 2019-09-11 MED ORDER — LEVOTHYROXINE SODIUM 125 MCG PO TABS: ORAL_TABLET | ORAL | 1 refills | Status: DC

## 2019-09-21 DIAGNOSIS — M791 Myalgia, unspecified site: Secondary | ICD-10-CM | POA: Insufficient documentation

## 2019-09-21 DIAGNOSIS — T466X5A Adverse effect of antihyperlipidemic and antiarteriosclerotic drugs, initial encounter: Secondary | ICD-10-CM | POA: Insufficient documentation

## 2019-10-07 LAB — HM MAMMOGRAPHY

## 2019-10-17 ENCOUNTER — Encounter: Payer: Self-pay | Admitting: Physician Assistant

## 2019-11-10 ENCOUNTER — Telehealth: Payer: Self-pay | Admitting: Physician Assistant

## 2019-11-10 DIAGNOSIS — K649 Unspecified hemorrhoids: Secondary | ICD-10-CM

## 2019-11-10 MED ORDER — HYDROCORTISONE ACETATE 25 MG RE SUPP: 25.0000 mg | Freq: Two times a day (BID) | RECTAL | 0 refills | Status: DC

## 2019-11-10 NOTE — Telephone Encounter (Signed)
Sent in [place] to [facility]

## 2019-11-10 NOTE — Telephone Encounter (Signed)
Patient is calling to ask Tawanna Sat for a script for Hemorid. Patient states she was advise by Tawanna Sat in the past not to let the Hemorid get out of control. Patient is traveling at [place] tomorrow for a trip. Patient is requesting script for suppository. Please advise  Preferred Pharmacy [facility] .

## 2019-11-10 NOTE — Telephone Encounter (Signed)
LM that prescription was sent in for her to [name]

## 2019-12-26 ENCOUNTER — Encounter: Payer: Self-pay | Admitting: Physician Assistant

## 2020-01-06 IMAGING — CT CT HEART SCORING
2 series · 16 of 20 positions shown, 18 images · non-contrast
Comparison: None.

CLINICAL DATA: Risk stratification

EXAM:
Coronary Calcium Score
TECHNIQUE: The patient was scanned on a Siemens Somatom 64 slice scanner. Axial
non-contrast 3 mm slices were carried out through the heart. The
data set was analyzed on a dedicated work station and scored using
the Agatson method.

[Series 3: casc 3.0 i36f 2 bestdiast 68 % · axial · 0.37mm/px · z∈[+1212,+1296]mm · 8 of 38 slices shown, 10 images]
[im 5/38  vessel]
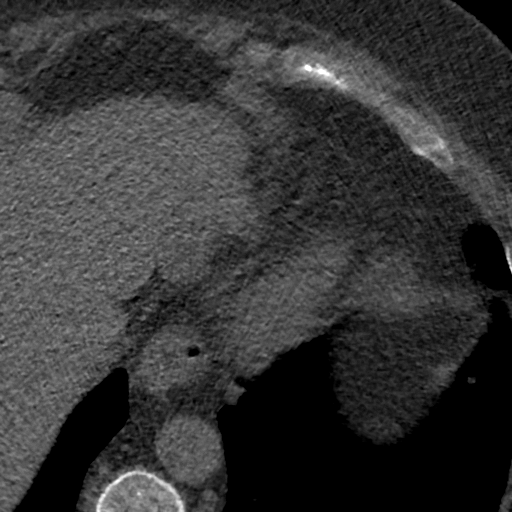
[im 5/38  lung]
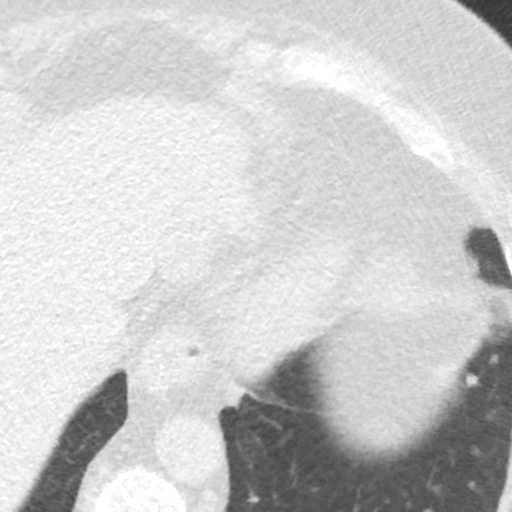
[im 9/38  vessel]
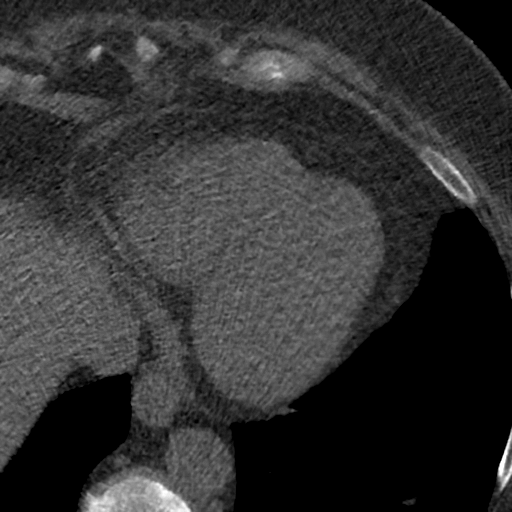
[im 13/38  vessel]
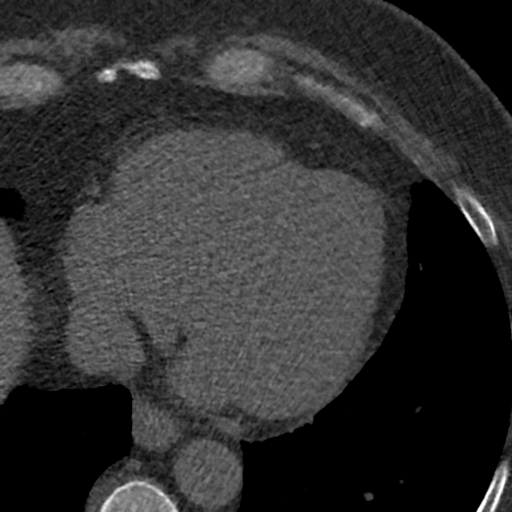
[im 17/38  vessel]
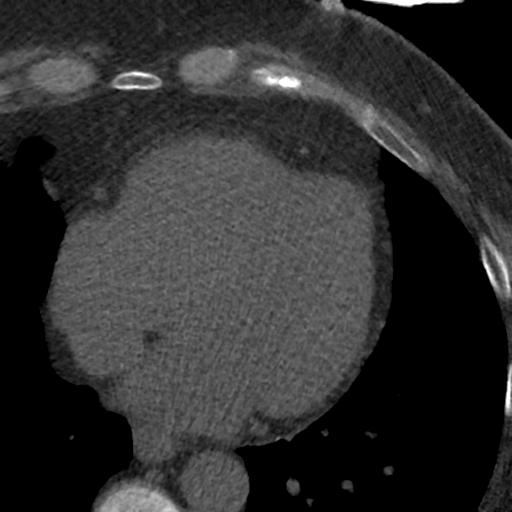
[im 21/38  vessel]
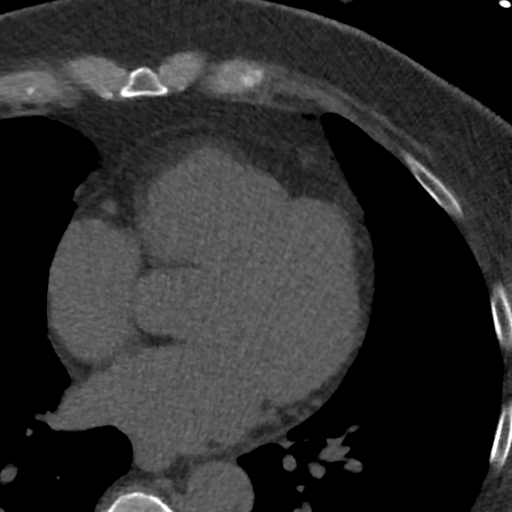
[im 21/38  lung]
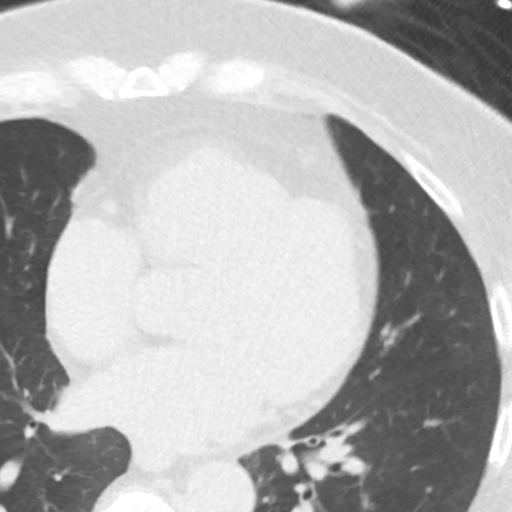
[im 25/38  vessel]
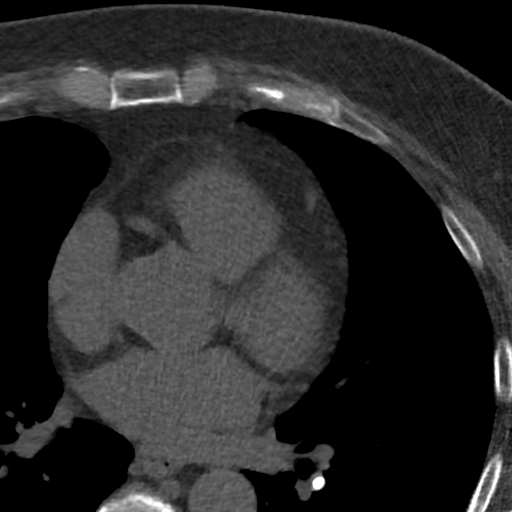
[im 29/38  vessel]
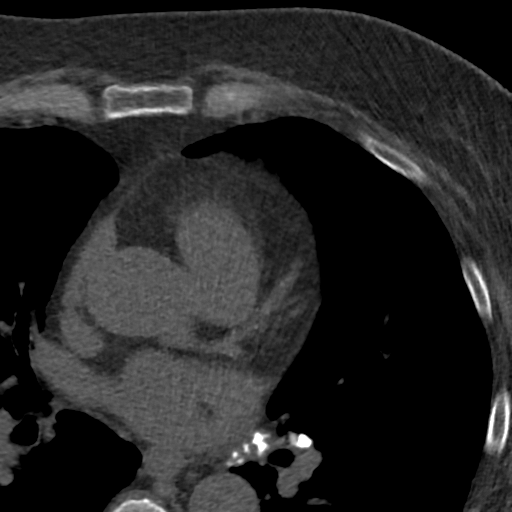
[im 33/38  vessel]
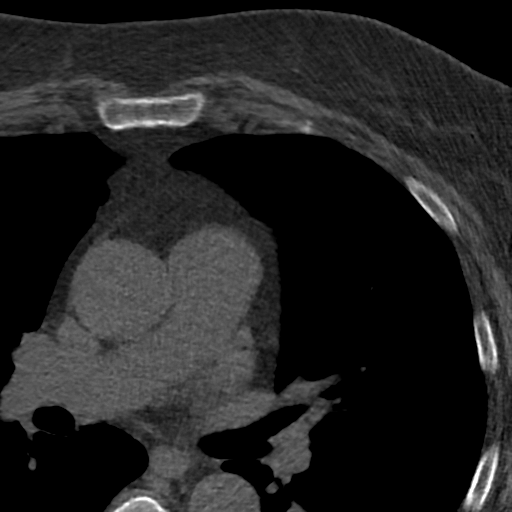

[Series 5: lung st 70 % · axial · 0.64mm/px · z∈[+1212,+1296]mm · 8 of 38 slices shown]
[im 5/38  lung]
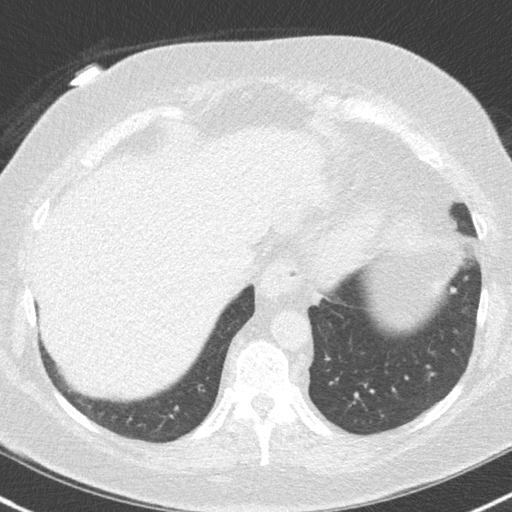
[im 9/38  lung]
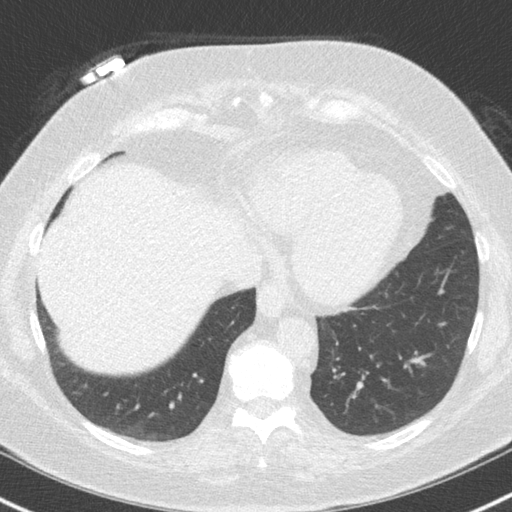
[im 13/38  lung]
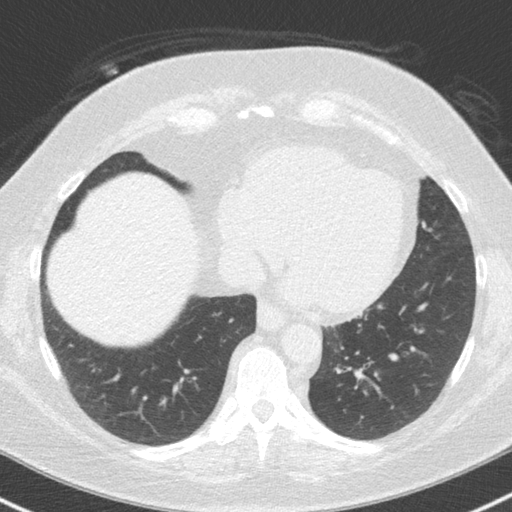
[im 17/38  lung]
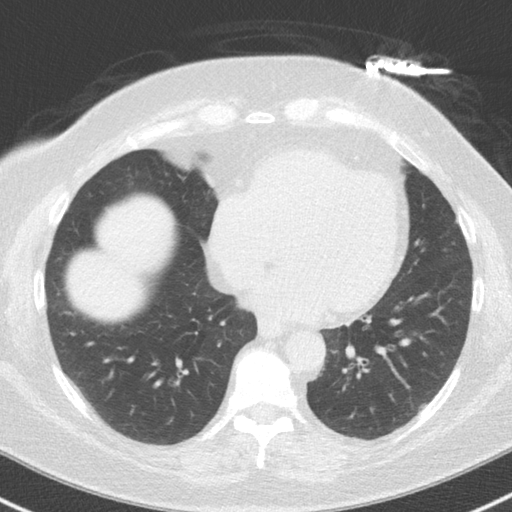
[im 21/38  lung]
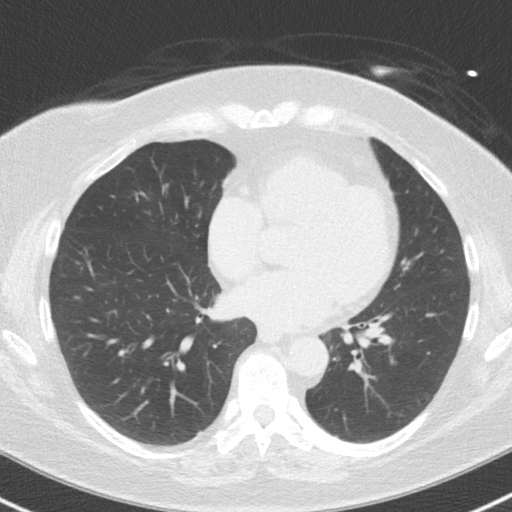
[im 25/38  lung]
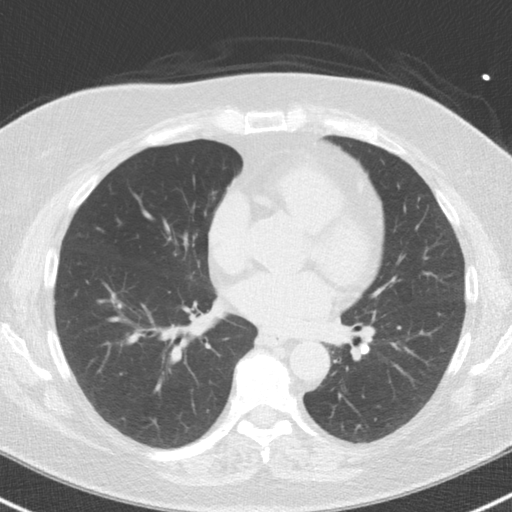
[im 29/38  lung]
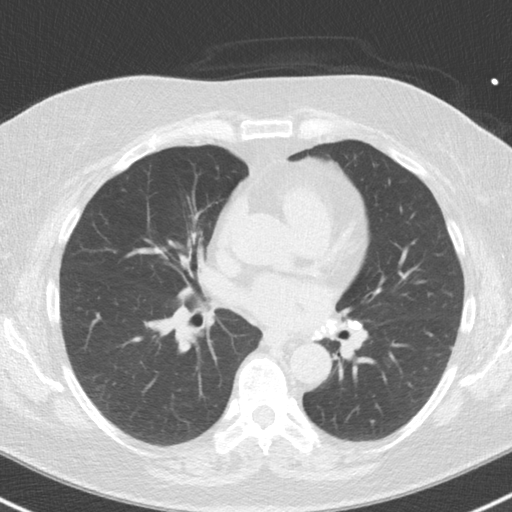
[im 33/38  lung]
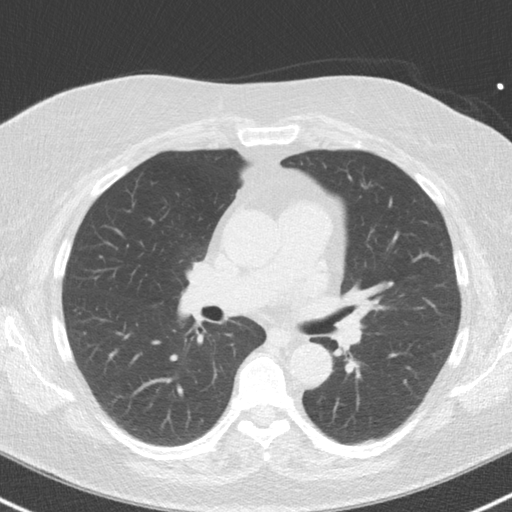

[16 of 20 positions shown; findings below may reference images not displayed]

FINDINGS: Non-cardiac: See separate report from [REDACTED].

Ascending Aorta: Upper normal diameter 3.7 cm

Pericardium: Normal

Coronary arteries: One isolated area of punctate calcium noted in
proximal LAD
IMPRESSION: Coronary calcium score of 5. This was 54 th percentile for age and
sex matched control.

Muneki Wachi

EXAM:
OVER-READ INTERPRETATION  CT CHEST

The following report is an over-read performed by radiologist Dr.
Nazareth Jumper [REDACTED] on 08/24/2017. This over-read
does not include interpretation of cardiac or coronary anatomy or
pathology. The coronary calcium score/coronary CTA interpretation by
the cardiologist is attached.
FINDINGS: Vascular: Normal caliber of the visualized thoracic aorta and
pulmonary arteries. Heart size is normal without significant
pericardial fluid.

Mediastinum/Nodes: Multiple calcifications in the left hilum
findings are compatible with old granulomatous disease. No
significant mediastinal lymph node enlargement.

Lungs/Pleura: 5 mm x 2 mm pleural-based nodule in the right middle
lobe on sequence 4, image 13. 4 mm nodule in the right middle lobe
on image 16. 4 mm nodule in the right middle lobe on image 19. Small
calcified granulomas in the left lower lobe. Additional small
pleural-based nodules in left lower lobe.

Upper Abdomen: Small hiatal hernia.

Musculoskeletal: Negative
IMPRESSION: Several small pulmonary nodules and evidence for old granulomatous
disease. Some of the small nodules are noncalcified and
indeterminate. No follow-up needed if patient is low-risk (and has
no known or suspected primary neoplasm). Non-contrast chest CT can
be considered in 12 months if patient is high-risk. This
recommendation follows the consensus statement: Guidelines for
Management of Incidental Pulmonary Nodules Detected on CT Images:

## 2020-01-07 ENCOUNTER — Other Ambulatory Visit: Payer: Self-pay | Admitting: Physician Assistant

## 2020-01-07 DIAGNOSIS — E559 Vitamin D deficiency, unspecified: Secondary | ICD-10-CM

## 2020-01-07 NOTE — Telephone Encounter (Signed)
Requested medication (s) are due for refill today: yes  Requested medication (s) are on the active medication list: yes  Last refill:  06/09/19 #12 1 refill  Future visit scheduled: yes  Notes to clinic:  not delegated per protocol     Requested Prescriptions  Pending Prescriptions Disp Refills   Vitamin D, Ergocalciferol, (DRISDOL) 1.25 MG (50000 UNIT) CAPS capsule [Pharmacy Med Name: VIT D2 (ERGOCAL) 1.25MG (50,000U) CP] 12 capsule 1    Sig: TAKE 1 CAPSULE BY MOUTH EVERY 7 DAYS      Endocrinology:  Vitamins - Vitamin D Supplementation Failed - 01/07/2020  5:35 PM      Failed - 50,000 IU strengths are not delegated      Failed - Phosphate in normal range and within 360 days    No results found for: PHOS        Passed - Ca in normal range and within 360 days    Calcium  Date Value Ref Range Status  09/10/2019 9.3 8.7 - 10.3 mg/dL Final          Passed - Vitamin D in normal range and within 360 days    Vit D, 25-Hydroxy  Date Value Ref Range Status  09/10/2019 47.8 30.0 - 100.0 ng/mL Final    Comment:    Vitamin D deficiency has been defined by the Institute of Medicine and an Endocrine Society practice guideline as a level of serum 25-OH vitamin D less than 20 ng/mL (1,2). The Endocrine Society went on to further define vitamin D insufficiency as a level between 21 and 29 ng/mL (2). 1. IOM (Institute of Medicine). 2010. Dietary reference    intakes for calcium and D. [place]: The    [employer]. 2. Holick MF, [place], Bischoff-Ferrari HA, et al.    Evaluation, treatment, and prevention of vitamin D    deficiency: an Endocrine Society clinical practice    guideline. JCEM. 2011 Jul; 96(7):1911-30.           Passed - Valid encounter within last 12 months    Recent Outpatient Visits           4 months ago Annual physical exam   [facility] Mar Daring, [place]   7 months ago Hypercholesterolemia   [place], Clearnce Sorrel, [place]   1 year ago Annual physical exam   [place], Clearnce Sorrel, [place]   1 year ago Restless leg syndrome   [place], Clearnce Sorrel, [place]   2 years ago Annual physical exam   [place], [place]       Future Appointments             In 8 months [place], Clearnce Sorrel, PA-C [employer], [place]

## 2020-03-20 ENCOUNTER — Other Ambulatory Visit: Payer: Self-pay | Admitting: Physician Assistant

## 2020-03-20 DIAGNOSIS — E890 Postprocedural hypothyroidism: Secondary | ICD-10-CM

## 2020-03-20 NOTE — Telephone Encounter (Signed)
Requested Prescriptions  Pending Prescriptions Disp Refills  . levothyroxine (SYNTHROID) 125 MCG tablet [Pharmacy Med Name: LEVOTHYROXINE 125 MCG TABLET] 80 tablet 0    Sig: TAKE ONE TABLET BY MOUTH 5 DAYS PER WEEK BEFORE BREAKFAST. TAKE ONE-HALF TABLETS BY MOUTH ON 2 DAYS PER WEEK.     Endocrinology:  Hypothyroid Agents Failed - 03/20/2020  9:37 AM      Failed - TSH needs to be rechecked within 3 months after an abnormal result. Refill until TSH is due.      Passed - TSH in normal range and within 360 days    TSH  Date Value Ref Range Status  09/10/2019 2.130 0.450 - 4.500 uIU/mL Final         Passed - Valid encounter within last 12 months    Recent Outpatient Visits          6 months ago Annual physical exam   [facility] Fenton Malling M, [place]   9 months ago Hypercholesterolemia   [facility], Clearnce Sorrel, [place]   1 year ago Annual physical exam   [facility] Fenton Malling M, [place]   2 years ago Restless leg syndrome   [place], Clearnce Sorrel, [place]   2 years ago Annual physical exam   [place], [place]      Future Appointments            In 5 months Burnette, Clearnce Sorrel, PA-C [employer], [place]

## 2020-03-30 DIAGNOSIS — Z961 Presence of intraocular lens: Secondary | ICD-10-CM | POA: Insufficient documentation

## 2020-04-13 ENCOUNTER — Encounter: Payer: Medicare Other | Admitting: Dermatology

## 2020-04-15 ENCOUNTER — Other Ambulatory Visit: Payer: Self-pay

## 2020-04-15 ENCOUNTER — Encounter: Payer: Self-pay | Admitting: Physician Assistant

## 2020-04-15 ENCOUNTER — Ambulatory Visit: Payer: Medicare PPO | Admitting: Physician Assistant

## 2020-04-15 VITALS — BP 143/84 | HR 86 | Temp 98.5°F | Wt 230.0 lb

## 2020-04-15 DIAGNOSIS — R739 Hyperglycemia, unspecified: Secondary | ICD-10-CM

## 2020-04-15 DIAGNOSIS — Z85850 Personal history of malignant neoplasm of thyroid: Secondary | ICD-10-CM

## 2020-04-15 DIAGNOSIS — R2689 Other abnormalities of gait and mobility: Secondary | ICD-10-CM | POA: Diagnosis not present

## 2020-04-15 DIAGNOSIS — E039 Hypothyroidism, unspecified: Secondary | ICD-10-CM

## 2020-04-15 DIAGNOSIS — R2681 Unsteadiness on feet: Secondary | ICD-10-CM

## 2020-04-15 DIAGNOSIS — Z6834 Body mass index (BMI) 34.0-34.9, adult: Secondary | ICD-10-CM

## 2020-04-15 DIAGNOSIS — E7800 Pure hypercholesterolemia, unspecified: Secondary | ICD-10-CM

## 2020-04-15 DIAGNOSIS — M791 Myalgia, unspecified site: Secondary | ICD-10-CM | POA: Diagnosis not present

## 2020-04-15 DIAGNOSIS — T466X5A Adverse effect of antihyperlipidemic and antiarteriosclerotic drugs, initial encounter: Secondary | ICD-10-CM

## 2020-04-15 DIAGNOSIS — E559 Vitamin D deficiency, unspecified: Secondary | ICD-10-CM

## 2020-04-15 DIAGNOSIS — E6609 Other obesity due to excess calories: Secondary | ICD-10-CM

## 2020-04-15 NOTE — Progress Notes (Signed)
Established patient visit --  Patient: Erica Pineda   DOB: 04-15-1950   70 y.o. Female  MRN: [id number] Visit Date: 04/15/2020  Today's healthcare provider: Mar Daring, PA-C   Chief Complaint  Patient presents with  . Leg Pain  . Foot Pain   Subjective    HPI  Lipid/Cholesterol, Follow-up  Last lipid panel Other pertinent labs  Lab Results  Component Value Date   CHOL 276 (H) 09/10/2019   HDL 54 09/10/2019   LDLCALC 199 (H) 09/10/2019   TRIG 128 09/10/2019   CHOLHDL 5.1 (H) 09/10/2019   Lab Results  Component Value Date   ALT 16 09/10/2019   AST 17 09/10/2019   PLT 305 09/10/2019   TSH 2.130 09/10/2019     She was last seen for this 7 months ago.  Management since that visit includes starting Ezetimibe.  Pt states she has not started it yet, and would like to discuss options.   Symptoms: No chest pain No chest pressure/discomfort  No dyspnea No lower extremity edema  No numbness or tingling of extremity No orthopnea  No palpitations No paroxysmal nocturnal dyspnea  No speech difficulty No syncope   Current diet: in general, a "healthy" diet   Current exercise: none  The 10-year ASCVD risk score Mikey Bussing DC Brooke Bonito., et al., 2013) is: 11.9%  ---------------------------------------------------------------------------------------------------   Patient Active Problem List   Diagnosis Date Noted  . Myalgia due to statin 09/21/2019  . Family history of breast cancer 08/10/2016  . BMI 33.0-33.9,adult 08/10/2016  . Lipoma of neck 11/25/2015  . Lipoma 06/30/2015  . Chronic frontal sinusitis 06/11/2014  . Blood glucose elevated 06/11/2014  . Adult hypothyroidism 06/11/2014  . Avitaminosis D 06/11/2014  . 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 Dysphonia 04/07/2013  . History of thyroid cancer 03/18/2009  . Hay fever 05/07/2006  . Hypercholesteremia 05/07/2006   Past Medical History:  Diagnosis Date  . Cancer ([place])    Thyroid-follwed by Endocrine  . Hyperlipidemia     Social History   Tobacco Use  . Smoking status: Never Smoker  . Smokeless tobacco: Never Used  Vaping Use  . Vaping Use: Never used  Substance Use Topics  . Alcohol use: Yes    Alcohol/week: 1.0 - 2.0 standard drink    Types: 1 - 2 Glasses of wine per week    Comment: occasional  . Drug use: No   Allergies  Allergen Reactions  . Percodan  [Oxycodone-Aspirin]   . Simvastatin Swelling    Other reaction(s): Muscle Pain     Medications: Outpatient Medications Prior to Visit  Medication Sig  . aspirin 81 MG EC tablet Take 81 mg by mouth daily. Swallow whole.  . Biotin 5 MG TABS Take by mouth daily.  . Cetirizine HCl 10 MG CAPS Take 1 capsule by mouth as needed. Reported on 06/30/2015  . Cholecalciferol (VITAMIN D3) 1000 units CAPS Take by mouth daily.   Marland Kitchen econazole nitrate 1 % cream Apply 1 application topically 2 (two) times daily.  Marland Kitchen ezetimibe (ZETIA) 10 MG tablet Take 1 tablet (10 mg total) by mouth daily.  . hydrocortisone (ANUSOL-HC) 25 MG suppository Place 1 suppository (25 mg total) rectally 2 (two) times daily.  Marland Kitchen levothyroxine (SYNTHROID) 125 MCG tablet TAKE ONE TABLET BY MOUTH 5 DAYS PER WEEK BEFORE BREAKFAST. TAKE ONE-HALF TABLETS BY MOUTH ON 2 DAYS PER WEEK.  . Misc Natural Products (ELDERBERRY ZINC/VIT C/IMMUNE MT) Take 200 mg by mouth daily.  Marland Kitchen  MULTIPLE VITAMINS-MINERALS PO Take by mouth daily.   . Vitamin D, Ergocalciferol, (DRISDOL) 1.25 MG (50000 UNIT) CAPS capsule TAKE 1 CAPSULE BY MOUTH EVERY 7 DAYS   No facility-administered medications prior to visit.    Review of Systems  Constitutional: Negative.   Respiratory: Negative.   Cardiovascular: Negative.   Musculoskeletal: Positive for arthralgias and myalgias (Feet and leg pain.). Negative for back pain, gait problem, joint swelling, neck pain and neck stiffness.  Neurological: Negative.        Objective    BP (!) 143/84 (BP Location: Left Arm, Patient Position: Sitting, Cuff Size: Large)   Pulse 86    Temp 98.5 F (36.9 C) (Oral)   Wt 230 lb (104.3 kg)   BMI 34.97 kg/m     Physical Exam Vitals reviewed.  Constitutional:      General: She is not in acute distress.    Appearance: Normal appearance. She is well-developed. She is obese. She is not ill-appearing or diaphoretic.  Cardiovascular:     Rate and Rhythm: Normal rate and regular rhythm.     Heart sounds: Normal heart sounds. No murmur heard. No friction rub. No gallop.   Pulmonary:     Effort: Pulmonary effort is normal. No respiratory distress.     Breath sounds: Normal breath sounds. No wheezing or rales.  Musculoskeletal:     Cervical back: Normal range of motion and neck supple.  Neurological:     Mental Status: She is alert.  Psychiatric:        Mood and Affect: Mood normal.        Thought Content: Thought content normal.     No results found for any visits on 04/15/20.  Assessment & Plan     1. Balance problem Having worsening balance issues and feels like she is unsteady on her feet. Will check labs as below and f/u pending results. Possible vit def, neuropathy, or other neurological issue. Referral to Neurology for further evaluation. Consult appreciated.  - Ambulatory referral to Neurology - CBC w/Diff/Platelet - Comprehensive Metabolic Panel (CMET) - Vitamin D (25 hydroxy) - B12 and Folate Panel  2. Gait instability See above medical treatment plan. - Ambulatory referral to Neurology - CBC w/Diff/Platelet - Comprehensive Metabolic Panel (CMET) - Vitamin D (25 hydroxy) - B12 and Folate Panel  3. Adult hypothyroidism Stable on levothyroxine 167mcg. Will check labs as below and f/u pending results. - CBC w/Diff/Platelet - TSH  4. Myalgia due to statin Has tried multiple statins and has had mylagias with all of them. Patient previously given Zetia but she has not tried yet. Encouraged to try Zetia and call and stop medicine if myalgias recur. Will check labs as below and f/u pending results. -  CBC w/Diff/Platelet - Comprehensive Metabolic Panel (CMET) - Lipid Panel With LDL/HDL Ratio  5. Hypercholesteremia See above medical treatment plan. - CBC w/Diff/Platelet - Comprehensive Metabolic Panel (CMET) - Lipid Panel With LDL/HDL Ratio - HgB A1c  6. History of thyroid cancer Stable on levothyroxine; s/p resection.  - CBC w/Diff/Platelet - TSH  7. Blood glucose elevated Diet controlled. Will check labs as below and f/u pending results. - CBC w/Diff/Platelet - Comprehensive Metabolic Panel (CMET) - HgB A1c  8. Avitaminosis D H/O this and postmenopausal. Will check labs as below and f/u pending results. - CBC w/Diff/Platelet - Vitamin D (25 hydroxy)  9. Class 1 obesity due to excess calories with serious comorbidity and body mass index (BMI) of 34.0 to 34.9 in  adult Counseled patient on healthy lifestyle modifications including dieting and exercise.  Will check labs as below and f/u pending results. - CBC w/Diff/Platelet - Comprehensive Metabolic Panel (CMET) - Lipid Panel With LDL/HDL Ratio - HgB A1c   No follow-ups on file.      Reynolds Bowl, PA-C, have reviewed all documentation for this visit. The documentation on 04/20/20 for the exam, diagnosis, procedures, and orders are all accurate and complete.   Rubye Beach  [facility] [phone] (phone) [phone] (fax)  [facility]

## 2020-04-17 LAB — CBC WITH DIFFERENTIAL/PLATELET
Basophils Absolute: 0.1 10*3/uL (ref 0.0–0.2)
Basos: 1 %
EOS (ABSOLUTE): 0.3 10*3/uL (ref 0.0–0.4)
Eos: 6 %
Hematocrit: 43.6 % (ref 34.0–46.6)
Hemoglobin: 14.3 g/dL (ref 11.1–15.9)
Immature Grans (Abs): 0 10*3/uL (ref 0.0–0.1)
Immature Granulocytes: 0 %
Lymphocytes Absolute: 1.5 10*3/uL (ref 0.7–3.1)
Lymphs: 27 %
MCH: 28.2 pg (ref 26.6–33.0)
MCHC: 32.8 g/dL (ref 31.5–35.7)
MCV: 86 fL (ref 79–97)
Monocytes Absolute: 0.5 10*3/uL (ref 0.1–0.9)
Monocytes: 9 %
Neutrophils Absolute: 3.1 10*3/uL (ref 1.4–7.0)
Neutrophils: 57 %
Platelets: 324 10*3/uL (ref 150–450)
RBC: 5.07 x10E6/uL (ref 3.77–5.28)
RDW: 12.9 % (ref 11.7–15.4)
WBC: 5.5 10*3/uL (ref 3.4–10.8)

## 2020-04-17 LAB — COMPREHENSIVE METABOLIC PANEL
ALT: 18 IU/L (ref 0–32)
AST: 20 IU/L (ref 0–40)
Albumin/Globulin Ratio: 1.9 (ref 1.2–2.2)
Albumin: 4.5 g/dL (ref 3.8–4.8)
Alkaline Phosphatase: 70 IU/L (ref 44–121)
BUN/Creatinine Ratio: 20 (ref 12–28)
BUN: 15 mg/dL (ref 8–27)
Bilirubin Total: 0.4 mg/dL (ref 0.0–1.2)
CO2: 22 mmol/L (ref 20–29)
Calcium: 9.5 mg/dL (ref 8.7–10.3)
Chloride: 101 mmol/L (ref 96–106)
Creatinine, Ser: 0.76 mg/dL (ref 0.57–1.00)
Globulin, Total: 2.4 g/dL (ref 1.5–4.5)
Glucose: 93 mg/dL (ref 65–99)
Potassium: 4.9 mmol/L (ref 3.5–5.2)
Sodium: 138 mmol/L (ref 134–144)
Total Protein: 6.9 g/dL (ref 6.0–8.5)
eGFR: 85 mL/min/{1.73_m2} (ref >59)

## 2020-04-17 LAB — B12 AND FOLATE PANEL
Folate: 11.7 ng/mL (ref >3.0)
Vitamin B-12: 687 pg/mL (ref 232–1245)

## 2020-04-17 LAB — LIPID PANEL WITH LDL/HDL RATIO
Cholesterol, Total: 298 mg/dL — ABNORMAL HIGH (ref 100–199)
HDL: 58 mg/dL (ref >39)
LDL Chol Calc (NIH): 208 mg/dL — ABNORMAL HIGH (ref 0–99)
LDL/HDL Ratio: 3.6 ratio — ABNORMAL HIGH (ref 0.0–3.2)
Triglycerides: 169 mg/dL — ABNORMAL HIGH (ref 0–149)
VLDL Cholesterol Cal: 32 mg/dL (ref 5–40)

## 2020-04-17 LAB — TSH: TSH: 0.958 u[IU]/mL (ref 0.450–4.500)

## 2020-04-17 LAB — VITAMIN D 25 HYDROXY (VIT D DEFICIENCY, FRACTURES): Vit D, 25-Hydroxy: 37.1 ng/mL (ref 30.0–100.0)

## 2020-04-17 LAB — HEMOGLOBIN A1C
Est. average glucose Bld gHb Est-mCnc: 134 mg/dL
Hgb A1c MFr Bld: 6.3 % — ABNORMAL HIGH (ref 4.8–5.6)

## 2020-04-20 ENCOUNTER — Encounter: Payer: Self-pay | Admitting: Physician Assistant

## 2020-04-22 ENCOUNTER — Telehealth: Payer: Self-pay

## 2020-04-22 NOTE — Telephone Encounter (Signed)
I called pt and pt verbalized understanding of information below.

## 2020-04-22 NOTE — Telephone Encounter (Signed)
-----   Message from Mar Daring, [place] sent at 04/22/2020  1:31 PM EDT ----- Caryl Asp,  Cholesterol was elevated. A1c was also increased to 6.3 from 6.1. Keep working on those healthy lifestyle modifications. All other labs looked great.   Best Wishes, Grace Bushy, PAC

## 2020-05-13 ENCOUNTER — Encounter: Payer: Self-pay | Admitting: Physician Assistant

## 2020-07-12 ENCOUNTER — Other Ambulatory Visit: Payer: Self-pay | Admitting: Physician Assistant

## 2020-07-12 DIAGNOSIS — E559 Vitamin D deficiency, unspecified: Secondary | ICD-10-CM

## 2020-07-15 NOTE — Telephone Encounter (Signed)
This encounter was created in error - please disregard.

## 2020-07-15 NOTE — Telephone Encounter (Signed)
Requested medication (s) are due for refill today: yes  Requested medication (s) are on the active medication list: yes   Last refill: 04/24/2020  Future visit scheduled: yes   Notes to clinic: 50,000 IU strengths are not delegated Patient is leaving to go out of town and needs asap    Requested Prescriptions  Pending Prescriptions Disp Refills   Vitamin D, Ergocalciferol, (DRISDOL) 1.25 MG (50000 UNIT) CAPS capsule [Pharmacy Med Name: VIT D2 (ERGOCAL) 1.25MG (50,000U) CP] 12 capsule 1    Sig: TAKE ONE CAPSULE BY [place]      Endocrinology:  Vitamins - Vitamin D Supplementation Failed - 07/15/2020 10:08 AM      Failed - 50,000 IU strengths are not delegated      Failed - Phosphate in normal range and within 360 days    No results found for: PHOS        Passed - Ca in normal range and within 360 days    Calcium  Date Value Ref Range Status  04/16/2020 9.5 8.7 - 10.3 mg/dL Final          Passed - Vitamin D in normal range and within 360 days    Vit D, 25-Hydroxy  Date Value Ref Range Status  04/16/2020 37.1 30.0 - 100.0 ng/mL Final    Comment:    Vitamin D deficiency has been defined by the Institute of Medicine and an Endocrine Society practice guideline as a level of serum 25-OH vitamin D less than 20 ng/mL (1,2). The Endocrine Society went on to further define vitamin D insufficiency as a level between 21 and 29 ng/mL (2). 1. IOM (Institute of Medicine). 2010. Dietary reference    intakes for calcium and D. [place]: The    [employer]. 2. Holick MF, Binkley Ophir, Bischoff-Ferrari HA, et al.    Evaluation, treatment, and prevention of vitamin D    deficiency: an Endocrine Society clinical practice    guideline. JCEM. 2011 Jul; 96(7):1911-30.           Passed - Valid encounter within last 12 months    Recent Outpatient Visits           3 months ago Balance problem   [place], [place]   10  months ago Annual physical exam   [place], Clearnce Sorrel, [place]   1 year ago Hypercholesterolemia   [place], Clearnce Sorrel, [place]   1 year ago Annual physical exam   [facility] Fenton Malling M, [place]   2 years ago Restless leg syndrome   [facility] [place], Clearnce Sorrel, [place]       Future Appointments             In 1 month Brendolyn Patty, MD [place]   In 1 month Burnette, Clearnce Sorrel, PA-C [employer], [place]

## 2020-07-15 NOTE — Telephone Encounter (Addendum)
Patient called and wanted to know why her vitamin D was refused. I advised of the message from [facility], PA-C noted on 07/15/20. She says she doesn't remember anyone telling her to decrease her vitamin D. She says Anderson Malta knew she was still taking the Vitamin D 50,000 unit capsules. I advised her level was WNL and to follow what Simona Huh recommends. She says the reason it's normal is because she was taking the larger dose. She says she's going out of town and will take this up with the office directly when she returns that she is highly disappointed and upset right now.       Message from [facility] sent at 07/15/2020  5:20 PM EDT  Pt stated she has called 3 times today regarding refill request because she is going out of town tonight. Pt stated she received a denial from her pharmacy and she would like a call back to explain why the request was denied. Cb# [phone]

## 2020-07-15 NOTE — Telephone Encounter (Signed)
Patient called again to get her refill on Vitamin D.  She stated she is going out of town in the morning and needs it today.  Please advise.

## 2020-07-15 NOTE — Telephone Encounter (Signed)
Chart shows Vitamin D was back to normal in March 2022 and the dosage was changed to 1000 IU daily.

## 2020-07-15 NOTE — Telephone Encounter (Signed)
Pt spoke with pharmacy today and was advised that the pharmacy sent a request for medication and hasnt received a response / Pt is calling about the refill needed for Vitamin D, Ergocalciferol, (DRISDOL) 1.25 MG (50000 UNIT) CAPS capsule  Pt is leaving to go out of town tomorrow morning and if she doesn't get refill today she will be without it for the next 3 weeks/ please advise asap   [name] [id number] Erica Pineda, Erica Pineda  [place], [place] [address]  Phone:  [phone]  Fax:  [phone]

## 2020-08-10 ENCOUNTER — Ambulatory Visit: Payer: Medicare PPO | Admitting: Dermatology

## 2020-08-24 ENCOUNTER — Telehealth: Payer: Self-pay

## 2020-08-24 NOTE — Telephone Encounter (Signed)
Copied from [place] [id number]. Topic: Appointment Scheduling - Scheduling Inquiry for Clinic >> Aug 24, 2020 12:42 PM Bayard Beaver wrote: Reason for CRM: Patient would like a call back if sooner appt becomes available

## 2020-08-25 ENCOUNTER — Other Ambulatory Visit: Payer: Self-pay | Admitting: Family Medicine

## 2020-08-25 ENCOUNTER — Ambulatory Visit: Payer: Medicare PPO | Admitting: Dermatology

## 2020-08-25 NOTE — Telephone Encounter (Signed)
Called pt and she had already went to urgent care.

## 2020-08-31 ENCOUNTER — Ambulatory Visit: Payer: Medicare PPO | Admitting: Family Medicine

## 2020-09-01 ENCOUNTER — Other Ambulatory Visit: Payer: Self-pay

## 2020-09-01 ENCOUNTER — Ambulatory Visit: Payer: Medicare PPO | Admitting: Dermatology

## 2020-09-01 DIAGNOSIS — L820 Inflamed seborrheic keratosis: Secondary | ICD-10-CM

## 2020-09-01 DIAGNOSIS — L814 Other melanin hyperpigmentation: Secondary | ICD-10-CM | POA: Diagnosis not present

## 2020-09-01 DIAGNOSIS — R238 Other skin changes: Secondary | ICD-10-CM

## 2020-09-01 DIAGNOSIS — L821 Other seborrheic keratosis: Secondary | ICD-10-CM | POA: Diagnosis not present

## 2020-09-01 NOTE — Progress Notes (Signed)
   Follow-Up Visit   Subjective  Erica Pineda is a 70 y.o. female who presents for the following: Spot (Left cheek x 4-5 mos. Itchy at times, no bleeding, crusts.). She also has itchy spots on her right mandible and scalp.   The following portions of the chart were reviewed this encounter and updated as appropriate:       Review of Systems:  No other skin or systemic complaints except as noted in HPI or Assessment and Plan.  Objective  Well appearing patient in no apparent distress; mood and affect are within normal limits.  A focused examination was performed including face. Relevant physical exam findings are noted in the Assessment and Plan.  Left Lower Cheek x 1, Right Mandible x 2, Frontal Scalp x 1 (4) Erythematous keratotic or waxy stuck-on papule   R lower lip Purple papule.    Assessment & Plan   Seborrheic Keratoses - Stuck-on, waxy, tan-brown papules and/or plaques  - Benign-appearing - Discussed benign etiology and prognosis. - Observe - Call for any changes  Lentigines - Scattered tan macules - Due to sun exposure - Benign-appering, observe - Recommend daily broad spectrum sunscreen SPF 30+ to sun-exposed areas, reapply every 2 hours as needed. - Call for any changes  Inflamed seborrheic keratosis Left Lower Cheek x 1, Right Mandible x 2, Frontal Scalp x 1  Destruction of lesion - Left Lower Cheek x 1, Right Mandible x 2, Frontal Scalp x 1  Destruction method: cryotherapy   Informed consent: discussed and consent obtained   Lesion destroyed using liquid nitrogen: Yes   Region frozen until ice ball extended beyond lesion: Yes   Outcome: patient tolerated procedure well with no complications   Post-procedure details: wound care instructions given   Additional details:  Prior to procedure, discussed risks of blister formation, small wound, skin dyspigmentation, or rare scar following cryotherapy. Recommend Vaseline ointment to treated areas while  healing.   Venous lake R lower lip  Benign, observe.    Return as scheduled for TBSE.  IJamesetta Orleans, CMA, am acting as scribe for Brendolyn Patty, MD .  Documentation: I have reviewed the above documentation for accuracy and completeness, and I agree with the above.  Brendolyn Patty MD

## 2020-09-01 NOTE — Patient Instructions (Signed)
Cryotherapy Aftercare  Wash gently with soap and water everyday.   Apply Vaseline and Band-Aid daily until healed.   Seborrheic Keratosis  What causes seborrheic keratoses? Seborrheic keratoses are harmless, common skin growths that first appear during adult life.  As time goes by, more growths appear.  Some people may develop a large number of them.  Seborrheic keratoses appear on both covered and uncovered body parts.  They are not caused by sunlight.  The tendency to develop seborrheic keratoses can be inherited.  They vary in color from skin-colored to gray, brown, or even black.  They can be either smooth or have a rough, warty surface.   Seborrheic keratoses are superficial and look as if they were stuck on the skin.  Under the microscope this type of keratosis looks like layers upon layers of skin.  That is why at times the top layer may seem to fall off, but the rest of the growth remains and re-grows.    Treatment Seborrheic keratoses do not need to be treated, but can easily be removed in the office.  Seborrheic keratoses often cause symptoms when they rub on clothing or jewelry.  Lesions can be in the way of shaving.  If they become inflamed, they can cause itching, soreness, or burning.  Removal of a seborrheic keratosis can be accomplished by freezing, burning, or surgery. If any spot bleeds, scabs, or grows rapidly, please return to have it checked, as these can be an indication of a skin cancer.   If you have any questions or concerns for your doctor, please call our main line at [phone] and press option 4 to reach your doctor's medical assistant. If no one answers, please leave a voicemail as directed and we will return your call as soon as possible. Messages left after 4 pm will be answered the following business day.   You may also send us a message via MyChart. We typically respond to MyChart messages within 1-2 business days.  For prescription refills, please ask your  pharmacy to contact our office. Our fax number is [phone].  If you have an urgent issue when the clinic is closed that cannot wait until the next business day, you can page your doctor at the number below.    Please note that while we do our best to be available for urgent issues outside of office hours, we are not available 24/7.   If you have an urgent issue and are unable to reach us, you may choose to seek medical care at your doctor's office, retail clinic, urgent care center, or emergency room.  If you have a medical emergency, please immediately call 911 or go to the emergency department.  Pager Numbers  - Dr. Kowalski: [phone]  - Dr. Moye: [phone]  - Dr. Stewart: [phone]  In the event of inclement weather, please call our main line at [phone] for an update on the status of any delays or closures.  Dermatology Medication Tips: Please keep the boxes that topical medications come in in order to help keep track of the instructions about where and how to use these. Pharmacies typically print the medication instructions only on the boxes and not directly on the medication tubes.   If your medication is too expensive, please contact our office at [phone] option 4 or send us a message through MyChart.   We are unable to tell what your co-pay for medications will be in advance as this is different depending on your insurance coverage.   However, we may be able to find a substitute medication at lower cost or fill out paperwork to get insurance to cover a needed medication.   If a prior authorization is required to get your medication covered by your insurance company, please allow us 1-2 business days to complete this process.  Drug prices often vary depending on where the prescription is filled and some pharmacies may offer cheaper prices.  The website www.goodrx.com contains coupons for medications through different pharmacies. The prices here do not  account for what the cost may be with help from insurance (it may be cheaper with your insurance), but the website can give you the price if you did not use any insurance.  - You can print the associated coupon and take it with your prescription to the pharmacy.  - You may also stop by our office during regular business hours and pick up a GoodRx coupon card.  - If you need your prescription sent electronically to a different pharmacy, notify our office through [facility] MyChart or by phone at [phone] option 4.  

## 2020-09-02 ENCOUNTER — Encounter: Payer: Self-pay | Admitting: Family Medicine

## 2020-09-02 ENCOUNTER — Other Ambulatory Visit: Payer: Self-pay | Admitting: Physician Assistant

## 2020-09-02 DIAGNOSIS — K649 Unspecified hemorrhoids: Secondary | ICD-10-CM

## 2020-09-02 MED ORDER — HYDROCORTISONE ACETATE 25 MG RE SUPP
25.0000 mg | Freq: Two times a day (BID) | RECTAL | 0 refills | Status: DC
Start: 2020-09-02 — End: 2020-09-28

## 2020-09-02 NOTE — Telephone Encounter (Signed)
Medication Refill - Medication:  hydrocortisone (ANUSOL-HC) 25 MG suppository  Has the patient contacted their pharmacy? No.   Preferred Pharmacy (with phone number or street name):  [name] [id number] [address], [place] Phone:  [phone]  Fax:  [phone]     Agent: Please be advised that RX refills may take up to 3 business days. We ask that you follow-up with your pharmacy.

## 2020-09-02 NOTE — Telephone Encounter (Signed)
Requested medication (s) are due for refill today:  Yes  Requested medication (s) are on the active medication list:   Yes  Future visit scheduled:   Yes with [name] on 09/27/2020   It will be her first visit.   Former Manufacturing systems engineer pt.   Last ordered: 11/10/2019 #12 supp.,  0 refills  Returned because pt has not met with [name].   Former pt of [employer]   Requested Prescriptions  Pending Prescriptions Disp Refills   hydrocortisone (ANUSOL-HC) 25 MG suppository 12 suppository 0    Sig: Place 1 suppository (25 mg total) rectally 2 (two) times daily.      There is no refill protocol information for this order

## 2020-09-02 NOTE — Telephone Encounter (Signed)
Please review. Ok to refill?  

## 2020-09-09 ENCOUNTER — Encounter: Payer: Self-pay | Admitting: Physician Assistant

## 2020-09-09 ENCOUNTER — Encounter: Payer: Self-pay | Admitting: Family Medicine

## 2020-09-27 ENCOUNTER — Encounter: Payer: Self-pay | Admitting: Family Medicine

## 2020-09-28 ENCOUNTER — Ambulatory Visit (INDEPENDENT_AMBULATORY_CARE_PROVIDER_SITE_OTHER): Payer: Medicare PPO | Admitting: Family Medicine

## 2020-09-28 ENCOUNTER — Encounter: Payer: Self-pay | Admitting: Family Medicine

## 2020-09-28 ENCOUNTER — Other Ambulatory Visit: Payer: Self-pay

## 2020-09-28 VITALS — BP 130/66 | HR 80 | Temp 98.4°F | Resp 16 | Ht 68.0 in | Wt 227.2 lb

## 2020-09-28 DIAGNOSIS — K649 Unspecified hemorrhoids: Secondary | ICD-10-CM | POA: Insufficient documentation

## 2020-09-28 DIAGNOSIS — Z803 Family history of malignant neoplasm of breast: Secondary | ICD-10-CM

## 2020-09-28 DIAGNOSIS — Z23 Encounter for immunization: Secondary | ICD-10-CM | POA: Insufficient documentation

## 2020-09-28 DIAGNOSIS — E559 Vitamin D deficiency, unspecified: Secondary | ICD-10-CM

## 2020-09-28 DIAGNOSIS — E7800 Pure hypercholesterolemia, unspecified: Secondary | ICD-10-CM

## 2020-09-28 DIAGNOSIS — Z0000 Encounter for general adult medical examination without abnormal findings: Secondary | ICD-10-CM | POA: Diagnosis not present

## 2020-09-28 DIAGNOSIS — R040 Epistaxis: Secondary | ICD-10-CM | POA: Insufficient documentation

## 2020-09-28 DIAGNOSIS — E890 Postprocedural hypothyroidism: Secondary | ICD-10-CM | POA: Insufficient documentation

## 2020-09-28 DIAGNOSIS — E039 Hypothyroidism, unspecified: Secondary | ICD-10-CM

## 2020-09-28 DIAGNOSIS — R739 Hyperglycemia, unspecified: Secondary | ICD-10-CM

## 2020-09-28 DIAGNOSIS — G629 Polyneuropathy, unspecified: Secondary | ICD-10-CM | POA: Insufficient documentation

## 2020-09-28 DIAGNOSIS — Z1231 Encounter for screening mammogram for malignant neoplasm of breast: Secondary | ICD-10-CM | POA: Insufficient documentation

## 2020-09-28 DIAGNOSIS — R7309 Other abnormal glucose: Secondary | ICD-10-CM | POA: Insufficient documentation

## 2020-09-28 MED ORDER — LEVOTHYROXINE SODIUM 125 MCG PO TABS: ORAL_TABLET | ORAL | 3 refills | Status: DC

## 2020-09-28 MED ORDER — VITAMIN D (ERGOCALCIFEROL) 1.25 MG (50000 UNIT) PO CAPS: 50000.0000 [IU] | ORAL_CAPSULE | ORAL | 3 refills | Status: DC

## 2020-09-28 MED ORDER — HYDROCORTISONE ACETATE 25 MG RE SUPP: 25.0000 mg | Freq: Two times a day (BID) | RECTAL | 6 refills | Status: DC | PRN

## 2020-09-28 NOTE — Assessment & Plan Note (Signed)
Pt's sister Pulled for mammogram

## 2020-09-28 NOTE — Assessment & Plan Note (Signed)
Failed statins d/t muscle complaints Does not wish to start again Discussed diet and exercise

## 2020-09-28 NOTE — Assessment & Plan Note (Signed)
HLD with obesity Neuropathy complaints

## 2020-09-28 NOTE — Progress Notes (Signed)
Complete physical exam   Patient: Erica Pineda   DOB: April 13, 1950   70 y.o. Female  MRN: [id number] Visit Date: 09/28/2020  Today's healthcare provider: Gwyneth Sprout, FNP   Chief Complaint  Patient presents with   Annual Exam   Subjective     HPI  Erica Pineda is a 70 y.o. female who presents today for a complete physical exam.  She reports consuming a general diet. Patient reports that she is actively exercising by walking 5x week- goal of 10,000 steps per day. Walks at least 20 mins daily. She generally feels fairly well. She reports sleeping fairly well. She does not have additional problems to discuss today.   Past Medical History:  Diagnosis Date   Cancer ([place])    Thyroid-follwed by Endocrine   Hyperlipidemia    Past Surgical History:  Procedure Laterality Date   ABDOMINAL HYSTERECTOMY     APPENDECTOMY  1996   BUNIONECTOMY  05/2007   and hammer-toe   CATARACT EXTRACTION Bilateral    CYSTOCELE REPAIR  1989   HERNIA REPAIR Left 1993   [place]   TOTAL THYROIDECTOMY  02/08/2009   Social History   Socioeconomic History   Marital status: Married    Spouse name: Annie Main   Number of children: 3   Years of education: 19   Highest education level: Conservator, museum/gallery (e.g., MA, MS, MEng, MEd, MSW, MBA)  Occupational History   Occupation: Publishing rights manager: [employer]    Comment: Customer service manager    Comment: retired  Tobacco Use   Smoking status: Never   Smokeless tobacco: Never  Vaping Use   Vaping Use: Never used  Substance and Sexual Activity   Alcohol use: Yes    Alcohol/week: 1.0 - 2.0 standard drink    Types: 1 - 2 Glasses of wine per week    Comment: occasional   Drug use: No   Sexual activity: Not on file  Other Topics Concern   Not on file  Social History Narrative   Not on file   Social Determinants of Health   Financial Resource Strain: Not on file  Food Insecurity: Not on file  Transportation  Needs: Not on file  Physical Activity: Not on file  Stress: Not on file  Social Connections: Not on file  Intimate Partner Violence: Not on file   Family Status  Relation Name Status   Sister  Alive   Mother  Deceased   Father  Deceased   Brother  Alive   Sister  [place]  (Not Specified)   Other  (Not Specified)   Family History  Problem Relation Age of Onset   Breast cancer Sister    Diabetes Sister    Osteoporosis Mother    Stroke Mother        in 2014   Alzheimer's disease Father    Diabetes Father    Diabetes Sister    Breast cancer Maternal Aunt    Hypertension Other    Hyperlipidemia Other    Heart disease Other    Diabetes Other    Alzheimer's disease Other    Allergies  Allergen Reactions   Percodan  [Oxycodone-Aspirin]    Simvastatin Swelling    Other reaction(s): Muscle Pain    Patient Care Team: Gwyneth Sprout, FNP as PCP - General (Family Medicine) Beverly Gust, MD as Consulting Physician (Otolaryngology) Albertine Patricia, [facility] as Attending  Physician (Podiatry) Ubaldo Glassing, Javier Docker, MD as Consulting Physician (Cardiology) Iris Pert, DC as Referring Physician (Chiropractic Medicine)   Medications: Outpatient Medications Prior to Visit  Medication Sig   aspirin 81 MG EC tablet Take 81 mg by mouth daily. Swallow whole.   Biotin 5 MG TABS Take by mouth daily.   Cetirizine HCl 10 MG CAPS Take 1 capsule by mouth as needed. Reported on 06/30/2015   econazole nitrate 1 % cream Apply 1 application topically 2 (two) times daily.   ELDERBERRY PO Take by mouth.   Misc Natural Products (ELDERBERRY ZINC/VIT C/IMMUNE MT) Take 200 mg by mouth daily.   MULTIPLE VITAMINS-MINERALS PO Take by mouth daily.    [DISCONTINUED] hydrocortisone (ANUSOL-HC) 25 MG suppository Place 1 suppository (25 mg total) rectally 2 (two) times daily.   [DISCONTINUED] levothyroxine (SYNTHROID) 125 MCG tablet TAKE ONE TABLET BY MOUTH 5 DAYS PER WEEK BEFORE BREAKFAST. TAKE ONE-HALF  TABLETS BY MOUTH ON 2 DAYS PER WEEK.   [DISCONTINUED] Vitamin D, Ergocalciferol, (DRISDOL) 1.25 MG (50000 UNIT) CAPS capsule TAKE 1 CAPSULE BY MOUTH EVERY 7 DAYS   Cholecalciferol (VITAMIN D3) 1000 units CAPS Take by mouth daily.  (Patient not taking: Reported on 09/28/2020)   No facility-administered medications prior to visit.    Review of Systems  Eyes:  Positive for photophobia.  All other systems reviewed and are negative.    Objective    BP 130/66   Pulse 80   Temp 98.4 F (36.9 C) (Oral)   Resp 16   Ht '5\' 8"'$  (1.727 m)   Wt 227 lb 3.2 oz (103.1 kg)   SpO2 93%   BMI 34.55 kg/m    Physical Exam Vitals and nursing note reviewed.  Constitutional:      General: She is not in acute distress.    Appearance: Normal appearance. She is obese. She is not ill-appearing, toxic-appearing or diaphoretic.  HENT:     Head: Normocephalic and atraumatic.     Right Ear: Tympanic membrane, ear canal and external ear normal. There is no impacted cerumen.     Left Ear: Tympanic membrane, ear canal and external ear normal. There is no impacted cerumen.     Nose: Nose normal. No congestion or rhinorrhea.     Mouth/Throat:     Mouth: Mucous membranes are moist.     Pharynx: Oropharynx is clear. No oropharyngeal exudate or posterior oropharyngeal erythema.  Eyes:     General:        Right eye: No discharge.        Left eye: No discharge.     Extraocular Movements: Extraocular movements intact.     Conjunctiva/sclera: Conjunctivae normal.     Pupils: Pupils are equal, round, and reactive to light.  Neck:     Vascular: No carotid bruit.  Cardiovascular:     Rate and Rhythm: Normal rate and regular rhythm.     Pulses: Normal pulses.     Heart sounds: Normal heart sounds. No murmur heard.   No friction rub. No gallop.  Pulmonary:     Effort: Pulmonary effort is normal. No respiratory distress.     Breath sounds: Normal breath sounds. No stridor. No wheezing, rhonchi or rales.  Chest:      Chest wall: No mass, lacerations, deformity, swelling, tenderness, crepitus or edema. There is no dullness to percussion.  Breasts:    Tanner Score is 5.     Breasts are symmetrical.     Right: Normal. No swelling, bleeding,  inverted nipple, mass, nipple discharge, skin change or tenderness.     Left: Normal. No swelling, bleeding, inverted nipple, mass, nipple discharge, skin change or tenderness.     Comments: Discussed know your lemons campaign and breast self exam Abdominal:     General: Bowel sounds are normal. There is no distension.     Palpations: Abdomen is soft. There is no mass.     Tenderness: There is no abdominal tenderness. There is no guarding or rebound.     Hernia: No hernia is present.  Musculoskeletal:        General: No swelling, tenderness, deformity or signs of injury. Normal range of motion.     Cervical back: Normal range of motion and neck supple. No rigidity or tenderness.     Right lower leg: No edema.     Left lower leg: No edema.  Lymphadenopathy:     Cervical: No cervical adenopathy.     Upper Body:     Right upper body: No supraclavicular, axillary or pectoral adenopathy.     Left upper body: No supraclavicular, axillary or pectoral adenopathy.  Skin:    General: Skin is warm and dry.     Capillary Refill: Capillary refill takes less than 2 seconds.     Coloration: Skin is not jaundiced or pale.     Findings: No bruising, erythema, lesion or rash.  Neurological:     General: No focal deficit present.     Mental Status: She is alert and oriented to person, place, and time. Mental status is at baseline.     Cranial Nerves: No cranial nerve deficit.     Sensory: No sensory deficit.     Motor: No weakness.     Coordination: Coordination normal.     Gait: Gait normal.     Deep Tendon Reflexes: Reflexes normal.  Psychiatric:        Mood and Affect: Mood normal.        Behavior: Behavior normal.        Thought Content: Thought content normal.         Judgment: Judgment normal.     Last depression screening scores PHQ 2/9 Scores 04/15/2020 07/09/2019 07/08/2018  PHQ - 2 Score 0 0 0  PHQ- 9 Score 0 - -   Last fall risk screening Fall Risk  04/15/2020  Falls in the past year? 0  Number falls in past yr: 0  Injury with Fall? 0  Risk for fall due to : No Fall Risks  Follow up Falls evaluation completed   Last Audit-C alcohol use screening Alcohol Use Disorder Test (AUDIT) 04/15/2020  1. How often do you have a drink containing alcohol? 2  2. How many drinks containing alcohol do you have on a typical day when you are drinking? 0  3. How often do you have six or more drinks on one occasion? 0  AUDIT-C Score 2  Alcohol Brief Interventions/Follow-up AUDIT Score <7 follow-up not indicated   A score of 3 or more in women, and 4 or more in men indicates increased risk for alcohol abuse, EXCEPT if all of the points are from question 1   No results found for any visits on 09/28/20.  Assessment & Plan    Routine Health Maintenance and Physical Exam  Exercise Activities and Dietary recommendations  Goals      Reduce portion size     Recommend to decrease portion sizes by eating 3 small healthy meals and at least  2 healthy snacks per day.        Immunization History  Administered Date(s) Administered   Fluad Quad(high Dose 65+) 11/06/2018   Influenza, High Dose Seasonal PF 01/26/2018   Influenza,inj,Quad PF,6+ Mos 12/16/2012   Moderna Sars-Covid-2 Vaccination 03/01/2019, 03/29/2019   Pneumococcal Conjugate-13 08/10/2016   Pneumococcal Polysaccharide-23 08/21/2017   Tdap 03/31/2010, 09/28/2020   Zoster Recombinat (Shingrix) 08/10/2016, 10/10/2016    Health Maintenance  Topic Date Due   COVID-19 Vaccine (3 - Moderna risk series) 04/26/2019   INFLUENZA VACCINE  08/30/2020   MAMMOGRAM  10/06/2020   DEXA SCAN  08/17/2021   COLONOSCOPY (Pts 45-98yr Insurance coverage will need to be confirmed)  01/30/2027   TETANUS/TDAP   09/29/2030   Hepatitis C Screening  Completed   PNA vac Low Risk Adult  Completed   Zoster Vaccines- Shingrix  Completed   HPV VACCINES  Aged Out    Discussed health benefits of physical activity, and encouraged her to engage in regular exercise appropriate for her age and condition.  Problem List Items Addressed This Visit       Cardiovascular and Mediastinum   Hemorrhoids    Pulled for medication refill Refused referral       Relevant Medications   hydrocortisone (ANUSOL-HC) 25 MG suppository     Endocrine   Adult hypothyroidism    Pulled for medication refill      Relevant Medications   levothyroxine (SYNTHROID) 125 MCG tablet   Other Relevant Orders   T4, free   TSH   Postoperative hypothyroidism    Follows ENT yearly Pulled for medication refill      Relevant Medications   levothyroxine (SYNTHROID) 125 MCG tablet     Nervous and Auditory   Neuropathy    Plans to follow up with neurology for sensation changes in bilateral feet; no concerns today on exam        Other   Hypercholesteremia    Failed statins d/t muscle complaints Does not wish to start again Discussed diet and exercise      Relevant Orders   Lipid panel   Blood glucose elevated    Hx of elevation Pulled for a1c labs      Avitaminosis D    Taking weekly dose as well as 1000 IU dose chronic concern; on acute complaints      Relevant Medications   Vitamin D, Ergocalciferol, (DRISDOL) 1.25 MG (50000 UNIT) CAPS capsule   Other Relevant Orders   Vitamin B12   VITAMIN D 25 Hydroxy (Vit-D Deficiency, Fractures)   Family history of breast cancer    Pt's sister Pulled for mammogram      Relevant Orders   DG Bone Density   Morbid [place]    HLD with obesity Neuropathy complaints      Relevant Orders   Hemoglobin A1c   Lipid panel   DG Bone Density   Need for immunization with diphtheria, tetanus, and poliovirus vaccine    >10 years since last vax; repeat today       Relevant Orders   Tdap vaccine greater than or equal to 7yo IM (Completed)   Bleeding nose    Intermittent nature Discussed with travel history      Relevant Orders   INR/PT   Preventative health care - Primary    No concerns today      Relevant Orders   CBC with Differential/Platelet   Comprehensive metabolic panel   Lipid panel   DG Bone Density  Breast cancer screening by mammogram    Routine screening Positive family hx      Relevant Orders   MM Digital Screening   Elevated random blood glucose level    A1c test; previous hyperglycemia on lab work      Relevant Orders   Hemoglobin A1c     Return in about 6 months (around 03/29/2021) for chonic disease management.     Vonna Kotyk, FNP, have reviewed all documentation for this visit. The documentation on 09/28/20 for the exam, diagnosis, procedures, and orders are all accurate and complete.    Gwyneth Sprout, [place] [phone] (phone) [phone] (fax)  [place]

## 2020-09-28 NOTE — Assessment & Plan Note (Signed)
Follows ENT yearly Pulled for medication refill

## 2020-09-28 NOTE — Assessment & Plan Note (Signed)
A1c test; previous hyperglycemia on lab work

## 2020-09-28 NOTE — Assessment & Plan Note (Signed)
Pulled for medication refill 

## 2020-09-28 NOTE — Assessment & Plan Note (Signed)
Intermittent nature Discussed with travel history

## 2020-09-28 NOTE — Assessment & Plan Note (Signed)
Pulled for medication refill Refused referral

## 2020-09-28 NOTE — Assessment & Plan Note (Signed)
Taking weekly dose as well as 1000 IU dose chronic concern; on acute complaints

## 2020-09-28 NOTE — Assessment & Plan Note (Signed)
No concerns today 

## 2020-09-28 NOTE — Assessment & Plan Note (Signed)
Hx of elevation Pulled for a1c labs

## 2020-09-28 NOTE — Assessment & Plan Note (Signed)
>  10 years since last vax; repeat today

## 2020-09-28 NOTE — Assessment & Plan Note (Signed)
Routine screening Positive family hx

## 2020-09-28 NOTE — Assessment & Plan Note (Signed)
Plans to follow up with neurology for sensation changes in bilateral feet; no concerns today on exam

## 2020-09-29 ENCOUNTER — Other Ambulatory Visit: Payer: Self-pay | Admitting: Family Medicine

## 2020-09-29 ENCOUNTER — Telehealth: Payer: Self-pay

## 2020-09-29 ENCOUNTER — Encounter: Payer: Self-pay | Admitting: Family Medicine

## 2020-09-29 DIAGNOSIS — Z1231 Encounter for screening mammogram for malignant neoplasm of breast: Secondary | ICD-10-CM

## 2020-09-29 DIAGNOSIS — E039 Hypothyroidism, unspecified: Secondary | ICD-10-CM

## 2020-09-29 LAB — COMPREHENSIVE METABOLIC PANEL
ALT: 21 IU/L (ref 0–32)
AST: 21 IU/L (ref 0–40)
Albumin/Globulin Ratio: 1.9 (ref 1.2–2.2)
Albumin: 4.4 g/dL (ref 3.8–4.8)
Alkaline Phosphatase: 70 IU/L (ref 44–121)
BUN/Creatinine Ratio: 22 (ref 12–28)
BUN: 14 mg/dL (ref 8–27)
Bilirubin Total: 0.3 mg/dL (ref 0.0–1.2)
CO2: 24 mmol/L (ref 20–29)
Calcium: 10.1 mg/dL (ref 8.7–10.3)
Chloride: 101 mmol/L (ref 96–106)
Creatinine, Ser: 0.65 mg/dL (ref 0.57–1.00)
Globulin, Total: 2.3 g/dL (ref 1.5–4.5)
Glucose: 82 mg/dL (ref 65–99)
Potassium: 4.8 mmol/L (ref 3.5–5.2)
Sodium: 139 mmol/L (ref 134–144)
Total Protein: 6.7 g/dL (ref 6.0–8.5)
eGFR: 95 mL/min/{1.73_m2} (ref >59)

## 2020-09-29 LAB — CBC WITH DIFFERENTIAL/PLATELET
Basophils Absolute: 0.1 10*3/uL (ref 0.0–0.2)
Basos: 1 %
EOS (ABSOLUTE): 0.3 10*3/uL (ref 0.0–0.4)
Eos: 4 %
Hematocrit: 42.6 % (ref 34.0–46.6)
Hemoglobin: 14.3 g/dL (ref 11.1–15.9)
Immature Grans (Abs): 0 10*3/uL (ref 0.0–0.1)
Immature Granulocytes: 0 %
Lymphocytes Absolute: 1.9 10*3/uL (ref 0.7–3.1)
Lymphs: 27 %
MCH: 28.8 pg (ref 26.6–33.0)
MCHC: 33.6 g/dL (ref 31.5–35.7)
MCV: 86 fL (ref 79–97)
Monocytes Absolute: 0.6 10*3/uL (ref 0.1–0.9)
Monocytes: 8 %
Neutrophils Absolute: 4.1 10*3/uL (ref 1.4–7.0)
Neutrophils: 60 %
Platelets: 293 10*3/uL (ref 150–450)
RBC: 4.96 x10E6/uL (ref 3.77–5.28)
RDW: 13.4 % (ref 11.7–15.4)
WBC: 6.9 10*3/uL (ref 3.4–10.8)

## 2020-09-29 LAB — PROTIME-INR
INR: 0.9 (ref 0.9–1.2)
Prothrombin Time: 9.8 s (ref 9.1–12.0)

## 2020-09-29 LAB — LIPID PANEL
Chol/HDL Ratio: 5.7 ratio — ABNORMAL HIGH (ref 0.0–4.4)
Cholesterol, Total: 293 mg/dL — ABNORMAL HIGH (ref 100–199)
HDL: 51 mg/dL (ref >39)
LDL Chol Calc (NIH): 209 mg/dL — ABNORMAL HIGH (ref 0–99)
Triglycerides: 174 mg/dL — ABNORMAL HIGH (ref 0–149)
VLDL Cholesterol Cal: 33 mg/dL (ref 5–40)

## 2020-09-29 LAB — VITAMIN D 25 HYDROXY (VIT D DEFICIENCY, FRACTURES): Vit D, 25-Hydroxy: 47.6 ng/mL (ref 30.0–100.0)

## 2020-09-29 LAB — HEMOGLOBIN A1C
Est. average glucose Bld gHb Est-mCnc: 128 mg/dL
Hgb A1c MFr Bld: 6.1 % — ABNORMAL HIGH (ref 4.8–5.6)

## 2020-09-29 LAB — VITAMIN B12: Vitamin B-12: 762 pg/mL (ref 232–1245)

## 2020-09-29 LAB — TSH: TSH: 5.04 u[IU]/mL — ABNORMAL HIGH (ref 0.450–4.500)

## 2020-09-29 LAB — T4, FREE: Free T4: 1.28 ng/dL (ref 0.82–1.77)

## 2020-09-29 MED ORDER — LEVOTHYROXINE SODIUM 150 MCG PO TABS: 150.0000 ug | ORAL_TABLET | Freq: Every day | ORAL | 3 refills | Status: DC

## 2020-09-29 NOTE — Telephone Encounter (Signed)
Copied from [place] [id number]. Topic: General - Other >> Sep 29, 2020  1:20 PM Tessa Lerner A wrote: Reason for CRM: Patient has called back with the fax number to send Bone Density Scan orders to   The orders can be sent to [facility] via Fax at [phone]  Please contact further if needed

## 2020-09-29 NOTE — Addendum Note (Signed)
Addended by: Minette Headland on: 09/29/2020 09:26 AM   Modules accepted: Orders

## 2020-10-20 LAB — HM MAMMOGRAPHY

## 2020-11-03 ENCOUNTER — Other Ambulatory Visit: Payer: Self-pay

## 2020-11-03 ENCOUNTER — Ambulatory Visit: Payer: Medicare PPO | Admitting: Dermatology

## 2020-11-03 DIAGNOSIS — L814 Other melanin hyperpigmentation: Secondary | ICD-10-CM

## 2020-11-03 DIAGNOSIS — I8310 Varicose veins of unspecified lower extremity with inflammation: Secondary | ICD-10-CM

## 2020-11-03 DIAGNOSIS — L578 Other skin changes due to chronic exposure to nonionizing radiation: Secondary | ICD-10-CM

## 2020-11-03 DIAGNOSIS — D229 Melanocytic nevi, unspecified: Secondary | ICD-10-CM

## 2020-11-03 DIAGNOSIS — Z1283 Encounter for screening for malignant neoplasm of skin: Secondary | ICD-10-CM

## 2020-11-03 DIAGNOSIS — L719 Rosacea, unspecified: Secondary | ICD-10-CM | POA: Diagnosis not present

## 2020-11-03 DIAGNOSIS — D1800 Hemangioma unspecified site: Secondary | ICD-10-CM

## 2020-11-03 DIAGNOSIS — R238 Other skin changes: Secondary | ICD-10-CM | POA: Diagnosis not present

## 2020-11-03 DIAGNOSIS — L821 Other seborrheic keratosis: Secondary | ICD-10-CM

## 2020-11-03 DIAGNOSIS — L853 Xerosis cutis: Secondary | ICD-10-CM

## 2020-11-03 MED ORDER — AZELAIC ACID 15 % EX GEL
CUTANEOUS | 5 refills | Status: DC
Start: 2020-11-03 — End: 2021-11-08

## 2020-11-03 MED ORDER — DOXYCYCLINE 40 MG PO CPDR
40.0000 mg | DELAYED_RELEASE_CAPSULE | ORAL | 5 refills | Status: DC
Start: 2020-11-03 — End: 2021-08-22

## 2020-11-03 NOTE — Progress Notes (Signed)
Follow-Up Visit   Subjective  Erica Pineda is a 70 y.o. female who presents for the following: Annual Exam (Patient here for TBSE. No spots of concern today. No history of skin cancer. ). Patient has redness of the cheeks and nose for years. She also has red, itchy eyes. She has not been diagnosed with rosacea in the past.     The following portions of the chart were reviewed this encounter and updated as appropriate:       Review of Systems:  No other skin or systemic complaints except as noted in HPI or Assessment and Plan.  Objective  Well appearing patient in no apparent distress; mood and affect are within normal limits.  A full examination was performed including scalp, head, eyes, ears, nose, lips, neck, chest, axillae, abdomen, back, buttocks, bilateral upper extremities, bilateral lower extremities, hands, feet, fingers, toes, fingernails, and toenails. All findings within normal limits unless otherwise noted below.  R lower lip 4.52mm purple papule.  face Erythema with telangiectasias of the nose and malar cheeks. Conjunctival erythema   Assessment & Plan  Skin cancer screening performed today.  Actinic Damage - chronic, secondary to cumulative UV radiation exposure/sun exposure over time - diffuse scaly erythematous macules with underlying dyspigmentation - Recommend daily broad spectrum sunscreen SPF 30+ to sun-exposed areas, reapply every 2 hours as needed.  - Recommend staying in the shade or wearing long sleeves, sun glasses (UVA+UVB protection) and wide brim hats (4-inch brim around the entire circumference of the hat). - Call for new or changing lesions.  Lentigines - Scattered tan macules - Due to sun exposure - Benign-appering, observe - Recommend daily broad spectrum sunscreen SPF 30+ to sun-exposed areas, reapply every 2 hours as needed. - Call for any changes  Seborrheic Keratoses - Stuck-on, waxy, tan-brown papules and/or plaques. R upper knee with  7.83mm waxy tan flat papule; L inframammary 1.0cm - Benign-appearing - Discussed benign etiology and prognosis. - Observe - Call for any changes  Hemangiomas - Red papules - Discussed benign nature - Observe - Call for any changes  Varicose Veins/Spider Veins - Dilated blue, purple or red veins at the lower extremities - Reassured - Smaller vessels can be treated by sclerotherapy (a procedure to inject a medicine into the veins to make them disappear) if desired, but the treatment is not covered by insurance. Larger vessels may be covered if symptomatic and we would refer to vascular surgeon if treatment desired.  Xerosis - diffuse xerotic patches of the left elbow and bilateral heels - recommend gentle, hydrating skin care - gentle skin care handout given  Melanocytic Nevi - Tan-brown and/or pink-flesh-colored symmetric macules and papules - Benign appearing on exam today - Observation - Call clinic for new or changing moles - Recommend daily use of broad spectrum spf 30+ sunscreen to sun-exposed areas.   Venous [place] R lower lip  Benign, observe.  Rosacea face  With Ocular involvement  Rosacea is a chronic progressive skin condition usually affecting the face of adults, causing redness and/or acne bumps. It is treatable but not curable. It sometimes affects the eyes (ocular rosacea) as well. It may respond to topical and/or systemic medication and can flare with stress, sun exposure, alcohol, exercise and some foods.  Daily application of broad spectrum spf 30+ sunscreen to face is recommended to reduce flares.  Start Azelaic Acid Gel Apply to face qd/bid dsp 50g 5Rf.   Start doxycycline 40mg  take 1 po QD dsp #30 5Rf.  Doxycycline should be taken with food to prevent nausea. Do not lay down for 30 minutes after taking. Be cautious with sun exposure and use good sun protection while on this medication. Pregnant women should not take this medication.    Azelaic Acid 15 %  gel - face Apply to face 1-2 times a day for rosacea.  doxycycline (ORACEA) 40 MG capsule - face Take 1 capsule (40 mg total) by mouth every morning.  Return in about 3 months (around 02/03/2021) for rosacea.  IJamesetta Orleans, CMA, am acting as scribe for Brendolyn Patty, MD . Documentation: I have reviewed the above documentation for accuracy and completeness, and I agree with the above.  Brendolyn Patty MD

## 2020-11-03 NOTE — Patient Instructions (Addendum)
Rosacea  What is rosacea? Rosacea (say: ro-zay-sha) is a common skin disease that usually begins as a trend of flushing or blushing easily.  As rosacea progresses, a persistent redness in the center of the face will develop and may gradually spread beyond the nose and cheeks to the forehead and chin.  In some cases, the ears, chest, and back could be affected.  Rosacea may appear as tiny blood vessels or small red bumps that occur in crops.  Frequently they can contain pus, and are called "pustules".  If the bumps do not contain pus, they are referred to as "papules".  Rarely, in prolonged, untreated cases of rosacea, the oil glands of the nose and cheeks may become permanently enlarged.  This is called rhinophyma, and is seen more frequently in men.  Signs and Risks In its beginning stages, rosacea tends to come and go, which makes it difficult to recognize.  It can start as intermittent flushing of the face.  Eventually, blood vessels may become permanently visible.  Pustules and papules can appear, but can be mistaken for adult acne.  People of all races, ages, genders and ethnic groups are at risk of developing rosacea.  However, it is more common in women (especially around menopause) and adults with fair skin between the ages of 3 and 87.  Treatment Dermatologists typically recommend a combination of treatments to effectively manage rosacea.  Treatment can improve symptoms and may stop the progression of the rosacea.  Treatment may involve both topical and oral medications.  The tetracycline antibiotics are often used for their anti-inflammatory effect; however, because of the possibility of developing antibiotic resistance, they should not be used long term at full dose.  For dilated blood vessels the options include electrodessication (uses electric current through a small needle), laser treatment, and cosmetics to hide the redness.   With all forms of treatment, improvement is a slow process, and  patients may not see any results for the first 3-4 weeks.  It is very important to avoid the sun and other triggers.  Patients must wear sunscreen daily.  Skin Care Instructions: Cleanse the skin with a mild soap such as CeraVe cleanser, Cetaphil cleanser, or Dove soap once or twice daily as needed. Moisturize with Eucerin Redness Relief Daily Perfecting Lotion (has a subtle green tint), CeraVe Moisturizing Cream, or Oil of Olay Daily Moisturizer with sunscreen every morning and/or night as recommended. Makeup should be "non-comedogenic" (won't clog pores) and be labeled "for sensitive skin". Good choices for cosmetics are: Neutrogena, Almay, and Physician's Formula.  Any product with a green tint tends to offset a red complexion. If your eyes are dry and irritated, use artificial tears 2-3 times per day and cleanse the eyelids daily with baby shampoo.  Have your eyes examined at least every 2 years.  Be sure to tell your eye doctor that you have rosacea. Alcoholic beverages tend to cause flushing of the skin, and may make rosacea worse. Always wear sunscreen, protect your skin from extreme hot and cold temperatures, and avoid spicy foods, hot drinks, and mechanical irritation such as rubbing, scrubbing, or massaging the face.  Avoid harsh skin cleansers, cleansing masks, astringents, and exfoliation. If a particular product burns or makes your face feel tight, then it is likely to flare your rosacea. If you are having difficulty finding a sunscreen that you can tolerate, you may try switching to a chemical-free sunscreen.  These are ones whose active ingredient is zinc oxide or titanium dioxide  only.  They should also be fragrance free, non-comedogenic, and labeled for sensitive skin. Rosacea triggers may vary from person to person.  There are a variety of foods that have been reported to trigger rosacea.  Some patients find that keeping a diary of what they were doing when they flared helps them avoid  triggers.    If you have any questions or concerns for your doctor, please call our main line at [phone] and press option 4 to reach your doctor's medical assistant. If no one answers, please leave a voicemail as directed and we will return your call as soon as possible. Messages left after 4 pm will be answered the following business day.   You may also send [place] a message via [place]. We typically respond to MyChart messages within 1-2 business days.  For prescription refills, please ask your pharmacy to contact our office. Our fax number is [phone].  If you have an urgent issue when the clinic is closed that cannot wait until the next business day, you can page your doctor at the number below.    Please note that while we do our best to be available for urgent issues outside of office hours, we are not available 24/7.   If you have an urgent issue and are unable to reach [place], you may choose to seek medical care at your doctor's office, retail clinic, urgent care center, or emergency room.  If you have a medical emergency, please immediately call 911 or go to the emergency department.  Pager Numbers  - Dr. Nehemiah Massed: [phone]  - Dr. Laurence Ferrari: [phone]  - Dr. Nicole Kindred: [phone]  In the event of inclement weather, please call our main line at [phone] for an update on the status of any delays or closures.  Dermatology Medication Tips: Please keep the boxes that topical medications come in in order to help keep track of the instructions about where and how to use these. Pharmacies typically print the medication instructions only on the boxes and not directly on the medication tubes.   If your medication is too expensive, please contact our office at [phone] option 4 or send [place] a message through [place].   We are unable to tell what your co-pay for medications will be in advance as this is different depending on your insurance coverage. However, we may be able to  find a substitute medication at lower cost or fill out paperwork to get insurance to cover a needed medication.   If a prior authorization is required to get your medication covered by your insurance company, please allow [place] 1-2 business days to complete this process.  Drug prices often vary depending on where the prescription is filled and some pharmacies may offer cheaper prices.  The website www.goodrx.com contains coupons for medications through different pharmacies. The prices here do not account for what the cost may be with help from insurance (it may be cheaper with your insurance), but the website can give you the price if you did not use any insurance.  - You can print the associated coupon and take it with your prescription to the pharmacy.  - You may also stop by our office during regular business hours and pick up a GoodRx coupon card.  - If you need your prescription sent electronically to a different pharmacy, notify our office through [facility] or by phone at [phone] option 4.

## 2020-11-08 ENCOUNTER — Telehealth: Payer: Self-pay

## 2020-11-08 DIAGNOSIS — L719 Rosacea, unspecified: Secondary | ICD-10-CM

## 2020-11-08 MED ORDER — DOXYCYCLINE HYCLATE 20 MG PO TABS: 20.0000 mg | ORAL_TABLET | Freq: Two times a day (BID) | ORAL | 3 refills | Status: DC

## 2020-11-08 NOTE — Addendum Note (Signed)
Addended by: Harriett Sine on: 11/08/2020 12:47 PM   Modules accepted: Orders

## 2020-11-08 NOTE — Telephone Encounter (Signed)
Doxycycline 40 mg denied by pt insurance. Pt must try and fail Doxycycline monohydrate 50 mg cap/tab, 75 mg tab, or 100 mg cap/tab.

## 2020-11-15 ENCOUNTER — Ambulatory Visit: Payer: Medicare PPO | Admitting: Dermatology

## 2020-12-06 LAB — HM DEXA SCAN

## 2021-02-22 ENCOUNTER — Other Ambulatory Visit: Payer: Self-pay

## 2021-02-22 ENCOUNTER — Ambulatory Visit: Payer: Medicare PPO | Admitting: Dermatology

## 2021-02-22 DIAGNOSIS — L719 Rosacea, unspecified: Secondary | ICD-10-CM | POA: Diagnosis not present

## 2021-02-22 NOTE — Patient Instructions (Addendum)
Doxcycline  Take with plenty of fluid Can take at night but don't take before bed.  Doxycycline should be taken with food to prevent nausea. Do not lay down for 30 minutes after taking. Be cautious with sun exposure and use good sun protection while on this medication. Pregnant women should not take this medication.    Recommend daily broad spectrum sunscreen SPF 30+ to sun-exposed areas, reapply every 2 hours as needed. Call for new or changing lesions.  Staying in the shade or wearing long sleeves, sun glasses (UVA+UVB protection) and wide brim hats (4-inch brim around the entire circumference of the hat) are also recommended for sun protection.   If You Need Anything After Your Visit  If you have any questions or concerns for your doctor, please call our main line at [phone] and press option 4 to reach your doctor's medical assistant. If no one answers, please leave a voicemail as directed and we will return your call as soon as possible. Messages left after 4 pm will be answered the following business day.   You may also send [place] a message via [place]. We typically respond to MyChart messages within 1-2 business days.  For prescription refills, please ask your pharmacy to contact our office. Our fax number is [phone].  If you have an urgent issue when the clinic is closed that cannot wait until the next business day, you can page your doctor at the number below.    Please note that while we do our best to be available for urgent issues outside of office hours, we are not available 24/7.   If you have an urgent issue and are unable to reach [place], you may choose to seek medical care at your doctor's office, retail clinic, urgent care center, or emergency room.  If you have a medical emergency, please immediately call 911 or go to the emergency department.  Pager Numbers  - Dr. Nehemiah Massed: [phone]  - Dr. Laurence Ferrari: [phone]  - Dr. Nicole Kindred: [phone]  In the event of  inclement weather, please call our main line at [phone] for an update on the status of any delays or closures.  Dermatology Medication Tips: Please keep the boxes that topical medications come in in order to help keep track of the instructions about where and how to use these. Pharmacies typically print the medication instructions only on the boxes and not directly on the medication tubes.   If your medication is too expensive, please contact our office at [phone] option 4 or send [place] a message through [place].   We are unable to tell what your co-pay for medications will be in advance as this is different depending on your insurance coverage. However, we may be able to find a substitute medication at lower cost or fill out paperwork to get insurance to cover a needed medication.   If a prior authorization is required to get your medication covered by your insurance company, please allow [place] 1-2 business days to complete this process.  Drug prices often vary depending on where the prescription is filled and some pharmacies may offer cheaper prices.  The website www.goodrx.com contains coupons for medications through different pharmacies. The prices here do not account for what the cost may be with help from insurance (it may be cheaper with your insurance), but the website can give you the price if you did not use any insurance.  - You can print the associated coupon and take it with your prescription to the pharmacy.  -  You may also stop by our office during regular business hours and pick up a GoodRx coupon card.  - If you need your prescription sent electronically to a different pharmacy, notify our office through [facility] or by phone at [phone] option 4.     Si Usted Necesita Algo Despus de Su Visita  Tambin puede enviarnos un mensaje a travs de Pharmacist, community. Por lo general respondemos a los mensajes de MyChart en el transcurso de 1 a 2 das hbiles.  Para renovar  recetas, por favor pida a su farmacia que se ponga en contacto con nuestra oficina. Harland Dingwall de fax es [place] [phone].  Si tiene un asunto urgente cuando la clnica est cerrada y que no puede esperar hasta el siguiente da hbil, puede llamar/localizar a su doctor(a) al nmero que aparece a continuacin.   Por favor, tenga en cuenta que aunque hacemos todo lo posible para estar disponibles para asuntos urgentes fuera del horario de [place], no estamos disponibles las 24 horas del da, los 7 das de la [address].   Si tiene un problema urgente y no puede comunicarse con nosotros, puede optar por buscar atencin mdica  en el consultorio de su doctor(a), en una clnica privada, en un centro de atencin urgente o en una sala de emergencias.  Si tiene Engineering geologist, por favor llame inmediatamente al 911 o vaya a la sala de emergencias.  Nmeros de bper  - Dr. Nehemiah Massed: [phone]  - Dra. Moye: [phone]  - Dra. Nicole Kindred: [phone]  En caso de inclemencias del [place], por favor llame a Johnsie Kindred principal al [phone] para una actualizacin sobre el [place] de cualquier retraso o cierre.  Consejos para la medicacin en dermatologa: Por favor, guarde las cajas en las que vienen los medicamentos de uso tpico para ayudarle a seguir las instrucciones sobre dnde y cmo usarlos. Las farmacias generalmente imprimen las instrucciones del medicamento slo en las cajas y no directamente en los tubos del [place].   Si su medicamento es muy caro, por favor, pngase en contacto con Zigmund Daniel llamando al [phone] y presione la opcin 4 o envenos un mensaje a travs de Pharmacist, community.   No podemos decirle cul ser su copago por los medicamentos por adelantado ya que esto es diferente dependiendo de la cobertura de su seguro. Sin embargo, es posible que podamos encontrar un medicamento sustituto a Electrical engineer un formulario para que el seguro cubra el medicamento  que se considera necesario.   Si se requiere una autorizacin previa para que su compaa de seguros [place] su medicamento, por favor permtanos de 1 a 2 das hbiles para completar este proceso.  Los precios de los medicamentos varan con frecuencia dependiendo del Environmental consultant de dnde se surte la receta y alguna farmacias pueden ofrecer precios ms baratos.  El sitio web www.goodrx.com tiene cupones para medicamentos de Airline pilot. Los precios aqu no tienen en cuenta lo que podra costar con la ayuda del seguro (puede ser ms barato con su seguro), pero el sitio web puede darle el precio si no utiliz Research scientist (physical sciences).  - Puede imprimir el cupn correspondiente y llevarlo con su receta a la farmacia.  - Tambin puede pasar por nuestra oficina durante el horario de atencin regular y Charity fundraiser una tarjeta de cupones de GoodRx.  - Si necesita que su receta se enve electrnicamente a Chiropodist, informe a nuestra oficina a travs de MyChart de [place] o por telfono llamando al [phone] y  presione la opcin 4.

## 2021-02-22 NOTE — Progress Notes (Signed)
° °  Follow-Up Visit   Subjective  Erica Pineda is a 71 y.o. female who presents for the following: Follow-up (Patient here today for 3 month follow up on rosacea. Patient is unsure if she has noticed a change since using azelic acid at face. Patient was prescribed doxycycline 40 mg but states she has not started medication yet. /).  She hasn't had as many red bumps on the face.  She has dry, irritated eyes all the time and uses natural tears eye drops.    The following portions of the chart were reviewed this encounter and updated as appropriate:      Review of Systems: No other skin or systemic complaints except as noted in HPI or Assessment and Plan.   Objective  Well appearing patient in no apparent distress; mood and affect are within normal limits.  A focused examination was performed including face, nose, eyes. Relevant physical exam findings are noted in the Assessment and Plan.  mid face, eyes, nose Mild/mod erythema and telangiectasia at mid face and mild conjunctival erythema.  No inflammatory papules face   Assessment & Plan  Rosacea mid face, eyes, nose  With occular symptoms.  Some improvement with Finacea gel.  Rosacea is a chronic progressive skin condition usually affecting the face of adults, causing redness and/or acne bumps. It is treatable but not curable. It sometimes affects the eyes (ocular rosacea) as well. It may respond to topical and/or systemic medication and can flare with stress, sun exposure, alcohol, exercise and some foods.  Daily application of broad spectrum spf 30+ sunscreen to face is recommended to reduce flares.  Continue azelaic acid 15 % gel apply topically to face 1 - 2 times a day for rosacea   Start doxycycline 40 mg capsule by mouth every morning, for trial to see if it helps ocular symptoms  Doxycycline should be taken with food to prevent nausea. Do not lay down for 30 minutes after taking. Be cautious with sun exposure and use good sun  protection while on this medication. Pregnant women should not take this medication.   Briefly discussed Rhofade gel and BBL laser procedure for persistent redness.  Pt defers at this time.  She can cover it with make-up and is not bothersome.    Related Medications Azelaic Acid 15 % gel Apply to face 1-2 times a day for rosacea.  doxycycline (ORACEA) 40 MG capsule Take 1 capsule (40 mg total) by mouth every morning.   Return for tbse after october 5 .  I, Ruthell Rummage, CMA, am acting as scribe for Brendolyn Patty, MD.  Documentation: I have reviewed the above documentation for accuracy and completeness, and I agree with the above.  Brendolyn Patty MD

## 2021-03-10 ENCOUNTER — Telehealth: Payer: Self-pay

## 2021-03-10 NOTE — Telephone Encounter (Signed)
Patient came into the office this morning asking for a RF of a cream you prescribed her >3 years ago for yeast? She found a old history of Econazole Cream in her medication history but was unsure what it was used for. Patient did not know the name of the cream so I could not send RF in for her.

## 2021-03-14 MED ORDER — ECONAZOLE NITRATE 1 % EX CREA: TOPICAL_CREAM | Freq: Two times a day (BID) | CUTANEOUS | 1 refills | Status: DC | PRN

## 2021-03-14 NOTE — Addendum Note (Signed)
Addended by: Johnsie Kindred R on: 03/14/2021 09:39 AM   Modules accepted: Orders

## 2021-03-14 NOTE — Telephone Encounter (Signed)
RX sent in and patient sent MyChart message.

## 2021-03-22 ENCOUNTER — Encounter: Payer: Self-pay | Admitting: Family Medicine

## 2021-06-29 ENCOUNTER — Telehealth: Payer: Self-pay | Admitting: Family Medicine

## 2021-06-29 NOTE — Telephone Encounter (Signed)
Copied from [place] [id number]. Topic: Medicare AWV >> Jun 29, 2021 11:38 AM Cher Nakai R wrote: Reason for CRM:  Left message for patient to call back and schedule Medicare Annual Wellness Visit (AWV) in office.   If unable to come into the office for AWV,  please offer to do virtually or by telephone.  Last AWV:  07/09/2019  Please schedule at anytime with [facility]visor.  30 minute appointment for Virtual or phone 45 minute appointment for in office or Initial virtual/phone  Any questions, please contact me at [phone]

## 2021-08-08 ENCOUNTER — Ambulatory Visit (INDEPENDENT_AMBULATORY_CARE_PROVIDER_SITE_OTHER): Payer: Medicare PPO

## 2021-08-08 VITALS — Wt 227.0 lb

## 2021-08-08 DIAGNOSIS — Z0000 Encounter for general adult medical examination without abnormal findings: Secondary | ICD-10-CM

## 2021-08-08 NOTE — Progress Notes (Signed)
Virtual Visit via Telephone Note  I connected with  Erica Pineda on 08/08/21 at 11:15 AM EDT by telephone and verified that I am speaking with the correct person using two identifiers.  Location: Patient: home Provider: [facility] Persons participating in the virtual visit: [employer]   I discussed the limitations, risks, security and privacy concerns of performing an evaluation and management service by telephone and the availability of in person appointments. The patient expressed understanding and agreed to proceed.  Interactive audio and video telecommunications were attempted between this nurse and patient, however failed, due to patient having technical difficulties OR patient did not have access to video capability.  We continued and completed visit with audio only.  Some vital signs may be absent or patient reported.   Dionisio David, LPN  Subjective:   Erica Pineda is a 71 y.o. female who presents for Medicare Annual (Subsequent) preventive examination.  Review of Systems     Cardiac Risk Factors include: advanced age (>16mn, >>36women);dyslipidemia;obesity (BMI >30kg/m2)     Objective:    There were no vitals filed for this visit. There is no height or weight on file to calculate BMI.     08/08/2021   11:26 AM 07/09/2019   10:04 AM 07/08/2018   10:06 AM 07/05/2017    9:23 AM 08/10/2016   10:05 AM 08/06/2014    3:45 PM 08/06/2014    3:44 PM  Advanced Directives  Does Patient Have a Medical Advance Directive? No Yes Yes Yes Yes  Yes  Type of ACorporate treasurerof AChewtonLiving will HSt. JohnLiving will HOakleyLiving will Healthcare Power of ASalinasof A[place]thor[place]ill  Copy of [place] Chart?  No - copy requested No - copy requested No - copy requested     Would patient like information on creating a medical advance directive? No - Patient declined           Current Medications (verified) Outpatient Encounter Medications as of 08/08/2021  Medication Sig   aspirin 81 MG EC tablet Take 81 mg by mouth daily. Swallow whole.   Azelaic Acid 15 % gel Apply to face 1-2 times a day for rosacea.   Biotin 5 MG TABS Take by mouth daily.   Cetirizine HCl 10 MG CAPS Take 1 capsule by mouth as needed. Reported on 06/30/2015   Cholecalciferol (VITAMIN D3) 1000 units CAPS Take by mouth daily.   doxycycline (ORACEA) 40 MG capsule Take 1 capsule (40 mg total) by mouth every morning.   doxycycline (PERIOSTAT) 20 MG tablet Take 20 mg by mouth 2 (two) times daily.   doxycycline (PERIOSTAT) 20 MG tablet doxycycline hyclate 20 mg tablet   econazole nitrate 1 % cream Apply topically 2 (two) times daily as needed. For 2-4 weeks or until rash cleared then PRN recurrence   ELDERBERRY PO Take by mouth.   hydrocortisone (ANUSOL-HC) 25 MG suppository Place 1 suppository (25 mg total) rectally 2 (two) times daily as needed for hemorrhoids or anal itching.   levothyroxine (SYNTHROID) 125 MCG tablet Take by mouth.   levothyroxine (SYNTHROID) 150 MCG tablet Take 1 tablet (150 mcg total) by mouth daily.   Misc Natural Products (ELDERBERRY ZINC/VIT C/IMMUNE MT) Take 200 mg by mouth daily.   Vitamin D, Ergocalciferol, (DRISDOL) 1.25 MG (50000 UNIT) CAPS capsule Take 1 capsule (50,000 Units total) by mouth every 7 (seven) days.   MULTIPLE VITAMINS-MINERALS PO Take by  mouth daily.  (Patient not taking: Reported on 08/08/2021)   promethazine-dextromethorphan (PROMETHAZINE-DM) 6.25-15 MG/5ML syrup promethazine-DM 6.25 mg-15 mg/5 mL oral syrup (Patient not taking: Reported on 08/08/2021)   [DISCONTINUED] albuterol (VENTOLIN HFA) 108 (90 Base) MCG/ACT inhaler albuterol sulfate HFA 90 mcg/actuation aerosol inhaler (Patient not taking: Reported on 08/08/2021)   No facility-administered encounter medications on file as of 08/08/2021.    Allergies (verified) Percodan  [oxycodone-aspirin] and  Simvastatin   History: Past Medical History:  Diagnosis Date   Cancer ([place])    Thyroid-follwed by Endocrine   Hyperlipidemia    Past Surgical History:  Procedure Laterality Date   ABDOMINAL HYSTERECTOMY     APPENDECTOMY  1996   BUNIONECTOMY  05/2007   and hammer-toe   CATARACT EXTRACTION Bilateral    [place] Left 1993   HYMENECTOMY  1971   [place]   TOTAL THYROIDECTOMY  02/08/2009   Family History  Problem Relation Age of Onset   Breast cancer Sister    Diabetes Sister    Osteoporosis Mother    Stroke Mother        in 2014   Alzheimer's disease Father    Diabetes Father    Diabetes Sister    Breast cancer Maternal Aunt    Hypertension Other    Hyperlipidemia Other    Heart disease Other    Diabetes Other    Alzheimer's disease Other    Social History   Socioeconomic History   Marital status: Married    Spouse name: Annie Main   Number of children: 3   Years of education: 34   Highest education level: Master's degree (e.g., MA, MS, MEng, MEd, MSW, MBA)  Occupational History   Occupation: Publishing rights manager: [employer]    Comment: Customer service manager    Comment: retired  Tobacco Use   Smoking status: Never   Smokeless tobacco: Never  Vaping Use   Vaping Use: Never used  Substance and Sexual Activity   Alcohol use: Yes    Alcohol/week: 1.0 - 2.0 standard drink of alcohol    Types: 1 - 2 Glasses of wine per week    Comment: occasional   Drug use: No   Sexual activity: Not on file  Other Topics Concern   Not on file  Social History Narrative   Not on file   Social Determinants of Health   Financial Resource Strain: Low Risk  (08/08/2021)   Overall Financial Resource Strain (CARDIA)    Difficulty of Paying Living Expenses: Not hard at all  Food Insecurity: No Food Insecurity (08/08/2021)   Hunger Vital Sign    Worried About Running Out of Food in the Last Year: Never true    Ran Out of Food in  the Last Year: Never true  Transportation Needs: No Transportation Needs (08/08/2021)   PRAPARE - Hydrologist (Medical): No    Lack of Transportation (Non-Medical): No  Physical Activity: Sufficiently Active (08/08/2021)   Exercise Vital Sign    Days of Exercise per Week: 5 days    Minutes of Exercise per Session: 30 min  Stress: No Stress Concern Present (08/08/2021)   [place]    Feeling of Stress : Only a little  Social Connections: Socially Integrated (08/08/2021)   Social Connection and Isolation Panel [NHANES]    Frequency of Communication with Friends and Family: More than three  times a week    Frequency of Social Gatherings with Friends and Family: Once a week    Attends Religious Services: More than 4 times per year    Active Member of [employer] or Organizations: Yes    Attends Music therapist: More than 4 times per year    Marital Status: Married    Tobacco Counseling Counseling given: Not Answered   Clinical Intake:  Pre-visit preparation completed: Yes  Pain : No/denies pain     Diabetes: No  How often do you need to have someone help you when you read instructions, pamphlets, or other written materials from your doctor or pharmacy?: 1 - Never  Diabetic?no  Interpreter Needed?: No  Information entered by :: Kirke Shaggy, LPN   Activities of Daily Living    08/08/2021   11:27 AM  In your present state of health, do you have any difficulty performing the following activities:  Hearing? 0  Vision? 0  Difficulty concentrating or making decisions? 0  Walking or climbing stairs? 0  Dressing or bathing? 0  Doing errands, shopping? 0  Preparing Food and eating ? N  Using the Toilet? N  In the past six months, have you accidently leaked urine? N  Do you have problems with loss of bowel control? N  Managing your Medications? N  Managing your Finances? N   Housekeeping or managing your Housekeeping? N    Patient Care Team: Gwyneth Sprout, FNP as PCP - General (Family Medicine) Beverly Gust, MD as Consulting Physician (Otolaryngology) Albertine Patricia, DPM (Inactive) as Attending Physician (Podiatry) Ubaldo Glassing Javier Docker, MD as Consulting Physician (Cardiology) Iris Pert, [place] as Referring Physician (Chiropractic Medicine)  Indicate any recent Medical Services you may have received from other than [facility] providers in the past year (date may be approximate).     Assessment:   This is a routine wellness examination for Erica Pineda.  Hearing/Vision screen Hearing Screening - Comments:: No aids Vision Screening - Comments:: [place]- MD at [place] issues and exercise activities discussed: Current Exercise Habits: Home exercise routine, Type of exercise: walking, Time (Minutes): 30, Frequency (Times/Week): 5, Weekly Exercise (Minutes/Week): 150, Intensity: Mild   Goals Addressed             This Visit's Progress    DIET - EAT MORE FRUITS AND VEGETABLES         Depression Screen    08/08/2021   11:24 AM 04/15/2020    2:42 PM 07/09/2019   10:01 AM 07/08/2018   10:07 AM 07/05/2017    9:21 AM 08/10/2016   10:07 AM 02/09/2016    2:04 PM  PHQ 2/9 Scores  PHQ - 2 Score 0 0 0 0 0 0 0  PHQ- 9 Score 0 0         Fall Risk    08/08/2021   11:27 AM 04/15/2020    2:42 PM 07/09/2019   10:05 AM 07/08/2018   10:07 AM 07/05/2017    9:21 AM  Fall Risk   Falls in the past year? 0 0 0 0 No  Number falls in past yr: 0 0 0    Injury with Fall? 0 0 0    Risk for fall due to : No Fall Risks No Fall Risks     Follow up Falls evaluation completed Falls evaluation completed       [place]:  Any stairs in or around the home? Yes  If so,  are there any without handrails? No  Home free of loose throw rugs in walkways, pet beds, electrical cords, etc? Yes  Adequate lighting in your home to reduce risk of falls? Yes    ASSISTIVE DEVICES UTILIZED TO PREVENT FALLS:  Life alert? No  Use of a cane, walker or w/c? No  Grab bars in the bathroom? Yes  Shower chair or bench in shower? No  Elevated toilet seat or a handicapped toilet? No    Cognitive Function:        08/08/2021   11:29 AM 07/09/2019   10:11 AM 07/08/2018   10:16 AM 07/05/2017    9:30 AM  6CIT Screen  What Year? 0 points 0 points 0 points 0 points  What month? 0 points 0 points 0 points 0 points  What time? 0 points 0 points 0 points 0 points  Count back from 20 0 points 0 points 0 points 0 points  Months in reverse 0 points 0 points 0 points 0 points  Repeat phrase 0 points 0 points 0 points 0 points  Total Score 0 points 0 points 0 points 0 points    Immunizations Immunization History  Administered Date(s) Administered   Fluad Quad(high Dose 65+) 11/06/2018   Influenza, High Dose Seasonal PF 01/26/2018, 10/13/2019   Influenza,inj,Quad PF,6+ Mos 12/16/2012   Moderna Sars-Covid-2 Vaccination 03/01/2019, 03/29/2019   Pneumococcal Conjugate-13 08/10/2016   Pneumococcal Polysaccharide-23 08/21/2017   Tdap 03/31/2010, 09/28/2020   Zoster Recombinat (Shingrix) 08/10/2016, 10/10/2016    TDAP status: Up to date  Flu Vaccine status: Declined, Education has been provided regarding the importance of this vaccine but patient still declined. Advised may receive this vaccine at local pharmacy or Health Dept. Aware to provide a copy of the vaccination record if obtained from local pharmacy or Health Dept. Verbalized acceptance and understanding.  Pneumococcal vaccine status: Up to date  Covid-19 vaccine status: Completed vaccines  Qualifies for Shingles Vaccine? Yes   Zostavax completed No   Shingrix Completed?: Yes  Screening Tests Health Maintenance  Topic Date Due   COVID-19 Vaccine (3 - Moderna risk series) 04/26/2019   MAMMOGRAM  10/06/2020   DEXA SCAN  08/17/2021   INFLUENZA VACCINE  08/30/2021   COLONOSCOPY (Pts 45-81yr  Insurance coverage will need to be confirmed)  01/30/2027   TETANUS/TDAP  09/29/2030   Pneumonia Vaccine 70 Years old  Completed   Hepatitis C Screening  Completed   Zoster Vaccines- Shingrix  Completed   HPV VACCINES  Aged Out    Health Maintenance  Health Maintenance Due  Topic Date Due   COVID-19 Vaccine (3 - Moderna risk series) 04/26/2019   MAMMOGRAM  10/06/2020    Colorectal cancer screening: Type of screening: Colonoscopy. Completed 01/01/17. Repeat every 10 years  Mamm[facility] status: [facility] Pt provided with contact info and advised to call to schedule appt.   Bone Density status: Completed 08/17/16. Results reflect: Bone density results: NORMAL. Repeat every 5 years. [facility] (Low Dose CT Chest recommended if Age 70-80years, 30 pack-year currently smoking OR have quit w/in 15years.) does not qualify.   Additional Screening:  Hepatitis C Screening: does qualify; Completed 02/11/16  Vision Screening: Recommended annual ophthalmology exams for early detection of glaucoma and other disorders of the eye. Is the patient up to date with their annual eye exam?  Yes  Who is the provider or what is the name of  the office in which the patient attends annual eye exams? [facility] If pt is not established with a provider, would they like to be referred toNo  a provider to establish care? No .   Dental Screening: Recommended annual dental exams for proper oral hygiene  Community Resource Referral / Chronic Care Management: CRR required this visit?  No   CCM required this visit?  No      Plan:     I have personally reviewed and noted the following in the patient's chart:   Medical and social history Use of alcohol, tobacco or illicit drugs  Current medications and supplements including opioid prescriptions.  Functional ability and status Nutritional status Physical activity Advanced  directives List of other physicians Hospitalizations, surgeries, and ER visits in previous 12 months Vitals Screenings to include cognitive, depression, and falls Referrals and appointments  In addition, I have reviewed and discussed with patient certain preventive protocols, quality metrics, and best practice recommendations. A written personalized care plan for preventive services as well as general preventive health recommendations were provided to patient.     Dionisio David, LPN   [id number]   Nurse Notes: none

## 2021-08-08 NOTE — Patient Instructions (Signed)
Erica Pineda , Thank you for taking time to come for your Medicare Wellness Visit. I appreciate your ongoing commitment to your health goals. Please review the following plan we discussed and let me know if I can assist you in the future.   Screening recommendations/referrals: Colonoscopy: 01/01/17 Mammogram: 09/28/20 ordered Bone Density: 12/06/20 Recommended yearly ophthalmology/optometry visit for glaucoma screening and checkup Recommended yearly dental visit for hygiene and checkup  Vaccinations: Influenza vaccine: n/d Pneumococcal vaccine: 08/21/17 Tdap vaccine: 09/28/20 Shingles vaccine: Shingrix 08/10/16, 10/10/16   Covid-19:03/01/19, 03/29/19, 12/29/19, 08/22/20  Advanced directives: no  Conditions/risks identified: none  Next appointment: Follow up in one year for your annual wellness visit 08/10/22 @ 10 am by phone   Preventive Care 65 Years and Older, Female Preventive care refers to lifestyle choices and visits with your health care provider that can promote health and wellness. What does preventive care include? A yearly physical exam. This is also called an annual well check. Dental exams once or twice a year. Routine eye exams. Ask your health care provider how often you should have your eyes checked. Personal lifestyle choices, including: Daily care of your teeth and gums. Regular physical activity. Eating a healthy diet. Avoiding tobacco and drug use. Limiting alcohol use. Practicing safe sex. Taking low-dose aspirin every day. Taking vitamin and mineral supplements as recommended by your health care provider. What happens during an annual well check? The services and screenings done by your health care provider during your annual well check will depend on your age, overall health, lifestyle risk factors, and family history of disease. Counseling  Your health care provider may ask you questions about your: Alcohol use. Tobacco use. Drug use. Emotional well-being. Home  and relationship well-being. Sexual activity. Eating habits. History of falls. Memory and ability to understand (cognition). Work and work Statistician. Reproductive health. Screening  You may have the following tests or measurements: Height, weight, and BMI. Blood pressure. Lipid and cholesterol levels. These may be checked every 5 years, or more frequently if you are over 38 years old. Skin check. Lung cancer screening. You may have this screening every year starting at age 30 if you have a 30-pack-year history of smoking and currently smoke or have quit within the past 15 years. Fecal occult blood test (FOBT) of the stool. You may have this test every year starting at age 24. Flexible sigmoidoscopy or colonoscopy. You may have a sigmoidoscopy every 5 years or a colonoscopy every 10 years starting at age 36. Hepatitis C blood test. Hepatitis B blood test. Sexually transmitted disease (STD) testing. Diabetes screening. This is done by checking your blood sugar (glucose) after you have not eaten for a while (fasting). You may have this done every 1-3 years. Bone density scan. This is done to screen for osteoporosis. You may have this done starting at age 70. Mammogram. This may be done every 1-2 years. Talk to your health care provider about how often you should have regular mammograms. Talk with your health care provider about your test results, treatment options, and if necessary, the need for more tests. Vaccines  Your health care provider may recommend certain vaccines, such as: Influenza vaccine. This is recommended every year. Tetanus, diphtheria, and acellular pertussis (Tdap, Td) vaccine. You may need a Td booster every 10 years. Zoster vaccine. You may need this after age 13. Pneumococcal 13-valent conjugate (PCV13) vaccine. One dose is recommended after age 56. Pneumococcal polysaccharide (PPSV23) vaccine. One dose is recommended after age 25. Talk  to your health care provider  about which screenings and vaccines you need and how often you need them. This information is not intended to replace advice given to you by your health care provider. Make sure you discuss any questions you have with your health care provider. Document Released: 02/12/2015 Document Revised: 10/06/2015 Document Reviewed: 11/17/2014 Elsevier Interactive Patient Education  2017 [place] Prevention in the Home Falls can cause injuries. They can happen to people of all ages. There are many things you can do to make your home safe and to help prevent falls. What can I do on the outside of my home? Regularly fix the edges of walkways and driveways and fix any cracks. Remove anything that might make you trip as you walk through a door, such as a raised step or threshold. Trim any bushes or trees on the path to your home. Use bright outdoor lighting. Clear any walking paths of anything that might make someone trip, such as rocks or tools. Regularly check to see if handrails are loose or broken. Make sure that both sides of any steps have handrails. Any raised decks and porches should have guardrails on the edges. Have any leaves, snow, or ice cleared regularly. Use sand or salt on walking paths during winter. Clean up any spills in your garage right away. This includes oil or grease spills. What can I do in the bathroom? Use night lights. Install grab bars by the toilet and in the tub and shower. Do not use towel bars as grab bars. Use non-skid mats or decals in the tub or shower. If you need to sit down in the shower, use a plastic, non-slip stool. Keep the floor dry. Clean up any water that spills on the floor as soon as it happens. Remove soap buildup in the tub or shower regularly. Attach bath mats securely with double-sided non-slip rug tape. Do not have throw rugs and other things on the floor that can make you trip. What can I do in the bedroom? Use night lights. Make sure  that you have a light by your bed that is easy to reach. Do not use any sheets or blankets that are too big for your bed. They should not hang down onto the floor. Have a firm chair that has side arms. You can use this for support while you get dressed. Do not have throw rugs and other things on the floor that can make you trip. What can I do in the kitchen? Clean up any spills right away. Avoid walking on wet floors. Keep items that you use a lot in easy-to-reach places. If you need to reach something above you, use a strong step stool that has a grab bar. Keep electrical cords out of the way. Do not use floor polish or wax that makes floors slippery. If you must use wax, use non-skid floor wax. Do not have throw rugs and other things on the floor that can make you trip. What can I do with my stairs? Do not leave any items on the stairs. Make sure that there are handrails on both sides of the stairs and use them. Fix handrails that are broken or loose. Make sure that handrails are as long as the stairways. Check any carpeting to make sure that it is firmly attached to the stairs. Fix any carpet that is loose or worn. Avoid having throw rugs at the top or bottom of the stairs. If you do have throw rugs,  attach them to the floor with carpet tape. Make sure that you have a light switch at the top of the stairs and the bottom of the stairs. If you do not have them, ask someone to add them for you. What else can I do to help prevent falls? Wear shoes that: Do not have high heels. Have rubber bottoms. Are comfortable and fit you well. Are closed at the toe. Do not wear sandals. If you use a stepladder: Make sure that it is fully opened. Do not climb a closed stepladder. Make sure that both sides of the stepladder are locked into place. Ask someone to hold it for you, if possible. Clearly mark and make sure that you can see: Any grab bars or handrails. First and last steps. Where the edge of  each step is. Use tools that help you move around (mobility aids) if they are needed. These include: Canes. Walkers. Scooters. Crutches. Turn on the lights when you go into a dark area. Replace any light bulbs as soon as they burn out. Set up your furniture so you have a clear path. Avoid moving your furniture around. If any of your floors are uneven, fix them. If there are any pets around you, be aware of where they are. Review your medicines with your doctor. Some medicines can make you feel dizzy. This can increase your chance of falling. Ask your doctor what other things that you can do to help prevent falls. This information is not intended to replace advice given to you by your health care provider. Make sure you discuss any questions you have with your health care provider. Document Released: 11/12/2008 Document Revised: 06/24/2015 Document Reviewed: 02/20/2014 Elsevier Interactive Patient Education  2017 [employer].

## 2021-08-10 ENCOUNTER — Other Ambulatory Visit: Payer: Self-pay | Admitting: Dermatology

## 2021-08-10 DIAGNOSIS — L719 Rosacea, unspecified: Secondary | ICD-10-CM

## 2021-08-22 ENCOUNTER — Ambulatory Visit (INDEPENDENT_AMBULATORY_CARE_PROVIDER_SITE_OTHER): Payer: Medicare PPO | Admitting: Family Medicine

## 2021-08-22 ENCOUNTER — Encounter: Payer: Self-pay | Admitting: Family Medicine

## 2021-08-22 VITALS — BP 144/83 | HR 85 | Temp 98.8°F | Resp 16 | Ht 68.0 in | Wt 242.0 lb

## 2021-08-22 DIAGNOSIS — E039 Hypothyroidism, unspecified: Secondary | ICD-10-CM | POA: Diagnosis not present

## 2021-08-22 DIAGNOSIS — E7800 Pure hypercholesterolemia, unspecified: Secondary | ICD-10-CM

## 2021-08-22 DIAGNOSIS — R739 Hyperglycemia, unspecified: Secondary | ICD-10-CM

## 2021-08-22 DIAGNOSIS — E890 Postprocedural hypothyroidism: Secondary | ICD-10-CM

## 2021-08-22 DIAGNOSIS — R7303 Prediabetes: Secondary | ICD-10-CM | POA: Diagnosis not present

## 2021-08-22 DIAGNOSIS — I10 Essential (primary) hypertension: Secondary | ICD-10-CM | POA: Diagnosis not present

## 2021-08-22 DIAGNOSIS — Z13820 Encounter for screening for osteoporosis: Secondary | ICD-10-CM | POA: Insufficient documentation

## 2021-08-22 DIAGNOSIS — Z0000 Encounter for general adult medical examination without abnormal findings: Secondary | ICD-10-CM | POA: Diagnosis not present

## 2021-08-22 DIAGNOSIS — F439 Reaction to severe stress, unspecified: Secondary | ICD-10-CM

## 2021-08-22 DIAGNOSIS — E559 Vitamin D deficiency, unspecified: Secondary | ICD-10-CM

## 2021-08-22 DIAGNOSIS — E119 Type 2 diabetes mellitus without complications: Secondary | ICD-10-CM | POA: Insufficient documentation

## 2021-08-22 NOTE — Patient Instructions (Signed)
Please call and schedule your mammogram:  [facility]  [address], [address] [facility] [place],  [place]  [id number] Get Driving Directions Main: [phone]  Sunday:Closed Monday:7:20 AM - 5:00 PM Tuesday:7:20 AM - 5:00 PM Wednesday:7:20 AM - 5:00 PM Thursday:7:20 AM - 5:00 PM Friday:7:20 AM - 4:30 PM Saturday:Closed  

## 2021-08-22 NOTE — Progress Notes (Unsigned)
Complete physical exam  I,Joseline E Rosas,acting as a scribe for Gwyneth Sprout, FNP.,have documented all relevant documentation on the behalf of Gwyneth Sprout, FNP,as directed by  Gwyneth Sprout, FNP while in the presence of Gwyneth Sprout, FNP.   Patient: Erica Pineda   DOB: [birth year]   71 y.o. Female  MRN: [id number] Visit Date: 08/22/2021  Today's healthcare provider: Gwyneth Sprout, FNP  Re Introduced to nurse practitioner role and practice setting.  All questions answered.  Discussed provider/patient relationship and expectations.   Chief Complaint  Patient presents with   Annual Exam   Subjective    Erica Pineda is a 71 y.o. female who presents today for a complete physical exam.  She reports consuming a general diet. The patient does not participate in regular exercise at present. She generally feels well. She reports sleeping fairly well. She does have additional problems to discuss today.   HPI  Had AWV with [facility] 08/08/2021  Past Medical History:  Diagnosis Date   Cancer ([place])    Thyroid-follwed by Endocrine   Hyperlipidemia    Past Surgical History:  Procedure Laterality Date   ABDOMINAL HYSTERECTOMY     APPENDECTOMY  1996   BUNIONECTOMY  05/2007   and hammer-toe   CATARACT EXTRACTION Bilateral    [place] Left 1993   [place]   TOTAL THYROIDECTOMY  02/08/2009   Social History   Socioeconomic History   Marital status: Married    Spouse name: Annie Main   Number of children: 3   Years of education: 39   Highest education level: Conservator, museum/gallery (e.g., MA, MS, MEng, MEd, MSW, MBA)  Occupational History   Occupation: Publishing rights manager: [employer]    Comment: Customer service manager    Comment: retired  Tobacco Use   Smoking status: Never   Smokeless tobacco: Never  Vaping Use   Vaping Use: Never used  Substance and Sexual Activity   Alcohol use: Yes    Alcohol/week: 1.0 - 2.0 standard  drink of alcohol    Types: 1 - 2 Glasses of wine per week    Comment: occasional   Drug use: No   Sexual activity: Not on file  Other Topics Concern   Not on file  Social History Narrative   Not on file   Social Determinants of Health   Financial Resource Strain: Low Risk  (08/08/2021)   Overall Financial Resource Strain (CARDIA)    Difficulty of Paying Living Expenses: Not hard at all  Food Insecurity: No Food Insecurity (08/08/2021)   Hunger Vital Sign    Worried About Running Out of Food in the Last Year: Never true    Ran Out of Food in the Last Year: Never true  Transportation Needs: No Transportation Needs (08/08/2021)   PRAPARE - Hydrologist (Medical): No    Lack of Transportation (Non-Medical): No  Physical Activity: Sufficiently Active (08/08/2021)   Exercise Vital Sign    Days of Exercise per Week: 5 days    Minutes of Exercise per Session: 30 min  Stress: No Stress Concern Present (08/08/2021)   [place]    Feeling of Stress : Only a little  Social Connections: Socially Integrated (08/08/2021)   Social Connection and Isolation Panel [NHANES]    Frequency of Communication with Friends and  Family: More than three times a week    Frequency of Social Gatherings with Friends and Family: Once a week    Attends Religious Services: More than 4 times per year    Active Member of Clubs or Organizations: Yes    Attends Banker Meetings: More than 4 times per year    Marital Status: Married  Catering manager Violence: Not At Risk (08/08/2021)   Humiliation, Afraid, Rape, and Kick questionnaire    Fear of Current or Ex-Partner: No    Emotionally Abused: No    Physically Abused: No    Sexually Abused: No   Family Status  Relation Name Status   Sister  Alive   Mother  Deceased   Father  Deceased   Brother  Alive   Sister  Alive   Mat Aunt  (Not Specified)   Other   (Not Specified)   Family History  Problem Relation Age of Onset   Breast cancer Sister    Diabetes Sister    Osteoporosis Mother    Stroke Mother        in 2014   Alzheimer's disease Father    Diabetes Father    Diabetes Sister    Breast cancer Maternal Aunt    Hypertension Other    Hyperlipidemia Other    Heart disease Other    Diabetes Other    Alzheimer's disease Other    Allergies  Allergen Reactions   Percodan  [Oxycodone-Aspirin]    Simvastatin Swelling    Other reaction(s): Muscle Pain    Patient Care Team: Jacky Kindle, FNP as PCP - General (Family Medicine) Linus Salmons, MD as Consulting Physician (Otolaryngology) Orland Jarred, Molli Hazard, DPM (Inactive) as Attending Physician (Podiatry) Lady Gary Darlin Priestly, MD as Consulting Physician (Cardiology) Josefina Do, DC as Referring Physician (Chiropractic Medicine)   Medications: Outpatient Medications Prior to Visit  Medication Sig   aspirin 81 MG EC tablet Take 81 mg by mouth daily. Swallow whole.   Azelaic Acid 15 % gel Apply to face 1-2 times a day for rosacea.   Biotin 5 MG TABS Take by mouth daily.   Cetirizine HCl 10 MG CAPS Take 1 capsule by mouth as needed. Reported on 06/30/2015   Cholecalciferol (VITAMIN D3) 1000 units CAPS Take by mouth daily.   doxycycline (PERIOSTAT) 20 MG tablet TAKE ONE TABLET BY MOUTH TWICE A DAY   econazole nitrate 1 % cream Apply topically 2 (two) times daily as needed. For 2-4 weeks or until rash cleared then PRN recurrence   ELDERBERRY PO Take by mouth.   hydrocortisone (ANUSOL-HC) 25 MG suppository Place 1 suppository (25 mg total) rectally 2 (two) times daily as needed for hemorrhoids or anal itching.   levothyroxine (SYNTHROID) 125 MCG tablet Take by mouth.   Misc Natural Products (ELDERBERRY ZINC/VIT C/IMMUNE MT) Take 200 mg by mouth daily.   MULTIPLE VITAMINS-MINERALS PO Take by mouth daily.   Vitamin D, Ergocalciferol, (DRISDOL) 1.25 MG (50000 UNIT) CAPS capsule Take 1 capsule  (50,000 Units total) by mouth every 7 (seven) days.   [DISCONTINUED] doxycycline (ORACEA) 40 MG capsule Take 1 capsule (40 mg total) by mouth every morning.   [DISCONTINUED] levothyroxine (SYNTHROID) 150 MCG tablet Take 1 tablet (150 mcg total) by mouth daily.   [DISCONTINUED] promethazine-dextromethorphan (PROMETHAZINE-DM) 6.25-15 MG/5ML syrup    No facility-administered medications prior to visit.    Review of Systems  Eyes:  Positive for photophobia and itching.  Genitourinary:  Positive for frequency.  Musculoskeletal:  Arthritis-hands  Allergic/Immunologic: Positive for environmental allergies.  All other systems reviewed and are negative.   Last CBC Lab Results  Component Value Date   WBC 5.6 08/22/2021   HGB 13.8 08/22/2021   HCT 41.1 08/22/2021   MCV 84 08/22/2021   MCH 28.2 08/22/2021   RDW 13.2 08/22/2021   PLT 262 08/22/2021   Last metabolic panel Lab Results  Component Value Date   GLUCOSE 90 08/22/2021   NA 142 08/22/2021   K 5.0 08/22/2021   CL 105 08/22/2021   CO2 24 08/22/2021   BUN 14 08/22/2021   CREATININE 0.69 08/22/2021   EGFR 93 08/22/2021   CALCIUM 9.3 08/22/2021   PROT 6.5 08/22/2021   ALBUMIN 4.0 08/22/2021   LABGLOB 2.5 08/22/2021   AGRATIO 1.6 08/22/2021   BILITOT 0.2 08/22/2021   ALKPHOS 70 08/22/2021   AST 21 08/22/2021   ALT 24 08/22/2021   Last lipids Lab Results  Component Value Date   CHOL 264 (H) 08/22/2021   HDL 49 08/22/2021   LDLCALC 177 (H) 08/22/2021   TRIG 204 (H) 08/22/2021   CHOLHDL 5.4 (H) 08/22/2021   Last hemoglobin A1c Lab Results  Component Value Date   HGBA1C 6.4 (H) 08/22/2021   Last thyroid functions Lab Results  Component Value Date   TSH 2.080 08/22/2021   Last vitamin D Lab Results  Component Value Date   VD25OH 47.3 08/22/2021   Last vitamin B12 and Folate Lab Results  Component Value Date   VITAMINB12 762 09/28/2020   FOLATE 11.7 04/16/2020      Objective     BP (!) 144/83  (BP Location: Left Arm, Patient Position: Sitting, Cuff Size: Large)   Pulse 85   Temp 98.8 F (37.1 C) (Oral)   Resp 16   Ht 5\' 8"  (1.727 m)   Wt 242 lb (109.8 kg)   BMI 36.80 kg/m   BP Readings from Last 3 Encounters:  08/22/21 (!) 144/83  09/28/20 130/66  04/15/20 (!) 143/84   Wt Readings from Last 3 Encounters:  08/22/21 242 lb (109.8 kg)  08/08/21 227 lb (103 kg)  09/28/20 227 lb 3.2 oz (103.1 kg)   SpO2 Readings from Last 3 Encounters:  09/28/20 93%  08/26/18 96%  10/10/16 97%       Physical Exam Vitals and nursing note reviewed.  Constitutional:      General: She is awake. She is not in acute distress.    Appearance: Normal appearance. She is well-developed and well-groomed. She is obese. She is not ill-appearing, toxic-appearing or diaphoretic.  HENT:     Head: Normocephalic and atraumatic.     Jaw: There is normal jaw occlusion. No trismus, tenderness, swelling or pain on movement.     Right Ear: Hearing, tympanic membrane, ear canal and external ear normal. There is no impacted cerumen.     Left Ear: Hearing, tympanic membrane, ear canal and external ear normal. There is no impacted cerumen.     Nose: Nose normal. No congestion or rhinorrhea.     Right Turbinates: Not enlarged, swollen or pale.     Left Turbinates: Not enlarged, swollen or pale.     Right Sinus: No maxillary sinus tenderness or frontal sinus tenderness.     Left Sinus: No maxillary sinus tenderness or frontal sinus tenderness.     Mouth/Throat:     Lips: Pink.     Mouth: Mucous membranes are moist. No injury.     Tongue: No lesions.  Pharynx: Oropharynx is clear. Uvula midline. No pharyngeal swelling, oropharyngeal exudate, posterior oropharyngeal erythema or uvula swelling.     Tonsils: No tonsillar exudate or tonsillar abscesses.  Eyes:     General: Lids are normal. Lids are everted, no foreign bodies appreciated. Vision grossly intact. Gaze aligned appropriately. No allergic shiner or  visual field deficit.       Right eye: No discharge.        Left eye: No discharge.     Extraocular Movements: Extraocular movements intact.     Conjunctiva/sclera: Conjunctivae normal.     Right eye: Right conjunctiva is not injected. No exudate.    Left eye: Left conjunctiva is not injected. No exudate.    Pupils: Pupils are equal, round, and reactive to light.     Comments: Chronic dry eyes  Neck:     Thyroid: No thyroid mass, thyromegaly or thyroid tenderness.     Vascular: No carotid bruit.     Trachea: Trachea normal.  Cardiovascular:     Rate and Rhythm: Normal rate and regular rhythm.     Pulses: Normal pulses.          Carotid pulses are 2+ on the right side and 2+ on the left side.      Radial pulses are 2+ on the right side and 2+ on the left side.       Dorsalis pedis pulses are 2+ on the right side and 2+ on the left side.       Posterior tibial pulses are 2+ on the right side and 2+ on the left side.     Heart sounds: Normal heart sounds, S1 normal and S2 normal. No murmur heard.    No friction rub. No gallop.  Pulmonary:     Effort: Pulmonary effort is normal. No respiratory distress.     Breath sounds: Normal breath sounds and air entry. No stridor. No wheezing, rhonchi or rales.  Chest:     Chest wall: No tenderness.     Comments: Breasts: risk and benefit of breast self-exam was discussed, not examined  Abdominal:     General: Abdomen is flat. Bowel sounds are normal. There is no distension.     Palpations: Abdomen is soft. There is no mass.     Tenderness: There is no abdominal tenderness. There is no right CVA tenderness, left CVA tenderness, guarding or rebound.     Hernia: No hernia is present.  Genitourinary:    Comments: Exam deferred; denies complaints Musculoskeletal:        General: No swelling, tenderness, deformity or signs of injury. Normal range of motion.     Cervical back: Full passive range of motion without pain, normal range of motion and neck  supple. No edema, rigidity or tenderness. No muscular tenderness.     Right lower leg: No edema.     Left lower leg: No edema.  Lymphadenopathy:     Cervical: No cervical adenopathy.     Right cervical: No superficial, deep or posterior cervical adenopathy.    Left cervical: No superficial, deep or posterior cervical adenopathy.  Skin:    General: Skin is warm and dry.     Capillary Refill: Capillary refill takes less than 2 seconds.     Coloration: Skin is not jaundiced or pale.     Findings: No bruising, erythema, lesion or rash.  Neurological:     General: No focal deficit present.     Mental Status: She is alert and  oriented to person, place, and time. Mental status is at baseline.     GCS: GCS eye subscore is 4. GCS verbal subscore is 5. GCS motor subscore is 6.     Sensory: Sensation is intact. No sensory deficit.     Motor: Motor function is intact. No weakness.     Coordination: Coordination is intact. Coordination normal.     Gait: Gait is intact. Gait normal.  Psychiatric:        Attention and Perception: Attention and perception normal.        Mood and Affect: Mood and affect normal.        Speech: Speech normal.        Behavior: Behavior normal. Behavior is cooperative.        Thought Content: Thought content normal.        Cognition and Memory: Cognition and memory normal.        Judgment: Judgment normal.     Last depression screening scores    08/22/2021    2:27 PM 08/08/2021   11:24 AM 04/15/2020    2:42 PM  PHQ 2/9 Scores  PHQ - 2 Score 0 0 0  PHQ- 9 Score 1 0 0   Last fall risk screening    08/22/2021    2:27 PM  [place] in the past year? 0  Number falls in past yr: 0  Injury with Fall? 0   Last Audit-C alcohol use screening    08/22/2021    2:28 PM  Alcohol Use Disorder Test (AUDIT)  1. How often do you have a drink containing alcohol? 2  2. How many drinks containing alcohol do you have on a typical day when you are drinking? 0  3. How  often do you have six or more drinks on one occasion? 0  AUDIT-C Score 2   A score of 3 or more in women, and 4 or more in men indicates increased risk for alcohol abuse, EXCEPT if all of the points are from question 1   Results for orders placed or performed in visit on 08/22/21  Comprehensive Metabolic Panel (CMET)  Result Value Ref Range   Glucose 90 70 - 99 mg/dL   BUN 14 8 - 27 mg/dL   Creatinine, Ser 0.69 0.57 - 1.00 mg/dL   eGFR 93 >59 mL/min/1.73   BUN/Creatinine Ratio 20 12 - 28   Sodium 142 134 - 144 mmol/L   Potassium 5.0 3.5 - 5.2 mmol/L   Chloride 105 96 - 106 mmol/L   CO2 24 20 - 29 mmol/L   Calcium 9.3 8.7 - 10.3 mg/dL   Total Protein 6.5 6.0 - 8.5 g/dL   Albumin 4.0 3.9 - 4.9 g/dL   Globulin, Total 2.5 1.5 - 4.5 g/dL   Albumin/Globulin Ratio 1.6 1.2 - 2.2   Bilirubin Total 0.2 0.0 - 1.2 mg/dL   Alkaline Phosphatase 70 44 - 121 IU/L   AST 21 0 - 40 IU/L   ALT 24 0 - 32 IU/L  CBC with Differential/Platelet  Result Value Ref Range   WBC 5.6 3.4 - 10.8 x10E3/uL   RBC 4.89 3.77 - 5.28 x10E6/uL   Hemoglobin 13.8 11.1 - 15.9 g/dL   Hematocrit 41.1 34.0 - 46.6 %   MCV 84 79 - 97 fL   MCH 28.2 26.6 - 33.0 pg   MCHC 33.6 31.5 - 35.7 g/dL   RDW 13.2 11.7 - 15.4 %   Platelets 262 150 -  450 x10E3/uL   Neutrophils 57 Not Estab. %   Lymphs 27 Not Estab. %   Monocytes 9 Not Estab. %   Eos 6 Not Estab. %   Basos 1 Not Estab. %   Neutrophils Absolute 3.2 1.4 - 7.0 x10E3/uL   Lymphocytes Absolute 1.5 0.7 - 3.1 x10E3/uL   Monocytes Absolute 0.5 0.1 - 0.9 x10E3/uL   EOS (ABSOLUTE) 0.3 0.0 - 0.4 x10E3/uL   Basophils Absolute 0.1 0.0 - 0.2 x10E3/uL   Immature Granulocytes 0 Not Estab. %   Immature Grans (Abs) 0.0 0.0 - 0.1 x10E3/uL  TSH + free T4  Result Value Ref Range   TSH 2.080 0.450 - 4.500 uIU/mL   Free T4 1.21 0.82 - 1.77 ng/dL  Hemoglobin A1c  Result Value Ref Range   Hgb A1c MFr Bld 6.4 (H) 4.8 - 5.6 %   Est. average glucose Bld gHb Est-mCnc 137 mg/dL  Lipid  panel  Result Value Ref Range   Cholesterol, Total 264 (H) 100 - 199 mg/dL   Triglycerides 204 (H) 0 - 149 mg/dL   HDL 49 >39 mg/dL   VLDL Cholesterol Cal 38 5 - 40 mg/dL   LDL Chol Calc (NIH) 177 (H) 0 - 99 mg/dL   Chol/HDL Ratio 5.4 (H) 0.0 - 4.4 ratio  Vitamin D (25 hydroxy)  Result Value Ref Range   Vit D, 25-Hydroxy 47.3 30.0 - 100.0 ng/mL  HM COLONOSCOPY  Result Value Ref Range   HM Colonoscopy See Report (in chart) See Report (in chart), Patient Reported  Results for orders placed or performed in visit on 08/22/21  HM MAMMOGRAPHY  Result Value Ref Range   HM Mammogram 0-4 Bi-Rad 0-4 Bi-Rad, Self Reported Normal  HM DEXA SCAN  Result Value Ref Range   HM Dexa Scan Osteopenia     Assessment & Plan    Routine Health Maintenance and Physical Exam  Exercise Activities and Dietary recommendations  Goals      DIET - EAT MORE FRUITS AND VEGETABLES     Reduce portion size     Recommend to decrease portion sizes by eating 3 small healthy meals and at least 2 healthy snacks per day.        Immunization History  Administered Date(s) Administered   Fluad Quad(high Dose 65+) 11/06/2018   Influenza, High Dose Seasonal PF 01/26/2018, 10/13/2019   Influenza,inj,Quad PF,6+ Mos 12/16/2012   Moderna Sars-Covid-2 Vaccination 03/01/2019, 03/29/2019   PFIZER(Purple Top)SARS-COV-2 Vaccination 12/29/2019, 08/22/2020   Pneumococcal Conjugate-13 08/10/2016   Pneumococcal Polysaccharide-23 08/21/2017   Tdap 03/31/2010, 09/28/2020   Zoster Recombinat (Shingrix) 08/10/2016, 10/10/2016    Health Maintenance  Topic Date Due   COVID-19 Vaccine (5 - Booster) 10/17/2020   INFLUENZA VACCINE  08/30/2021   MAMMOGRAM  10/20/2021   DEXA SCAN  12/06/2025   COLONOSCOPY (Pts 45-2yrs Insurance coverage will need to be confirmed)  01/30/2027   TETANUS/TDAP  09/29/2030   Pneumonia Vaccine 66+ Years old  Completed   Hepatitis C Screening  Completed   Zoster Vaccines- Shingrix  Completed   HPV  VACCINES  Aged Out    Discussed health benefits of physical activity, and encouraged her to engage in regular exercise appropriate for her age and condition.  Problem List Items Addressed This Visit       Cardiovascular and Mediastinum   Primary hypertension    New diagnosis; encourage use of medication to assist given ongoing stress at home and weight gain given previous intolerance of statin to prevent  heart attack/stroke encoruage follow up in 1 month-1.5 months to follow up       Relevant Medications   losartan-hydrochlorothiazide (HYZAAR) 50-12.5 MG tablet     Endocrine   Adult hypothyroidism    Chronic, stable F/b endocrine/oncology team Continues to use 125 mg synthroid daily Denies concerns outside of weight gain- believes this is related to stress from spouse/his health conditions       RESOLVED: Postoperative hypothyroidism     Other   Annual physical exam - Primary    UTD on annual dental and vision Things to do to keep yourself healthy  - Exercise at least 30-45 minutes a day, 3-4 days a week.  - Eat a low-fat diet with lots of fruits and vegetables, up to 7-9 servings per day.  - Seatbelts can save your life. Wear them always.  - Smoke detectors on every level of your home, check batteries every year.  - Eye Doctor - have an eye exam every 1-2 years  - Safe sex - if you may be exposed to STDs, use a condom.  - Alcohol -  If you drink, do it moderately, less than 2 drinks per day.  - [place]. Choose someone to speak for you if you are not able.  - Depression is common in our stressful world.If you're feeling down or losing interest in things you normally enjoy, please come in for a visit.  - Violence - If anyone is threatening or hurting you, please call immediately.        Relevant Orders   Comprehensive Metabolic Panel (CMET) (Completed)   CBC with Differential/Platelet (Completed)   TSH + free T4 (Completed)   Lipid panel  (Completed)   Avitaminosis D    Chronic, stable Continues to use Vit D to assist Request for repeat labs       Relevant Orders   Vitamin D (25 hydroxy) (Completed)   Hypercholesteremia    Repeat LP Not currently on statin; encourage repeat trial of different statin or injectable medication or alternative like zetia/fish oil Stroke/MI risk is elevated at 13% in 10 yearss The 10-year ASCVD risk score (Arnett DK, et al., 2019) is: 13.1%   Values used to calculate the score:     Age: 45 years     Sex: Female     Is Non-Hispanic African American: No     Diabetic: No     Tobacco smoker: No     Systolic Blood Pressure: 462 mmHg     Is BP treated: No     HDL Cholesterol: 49 mg/dL     Total Cholesterol: 264 mg/dL recommend diet low in saturated fat and regular exercise - 30 min at least 5 times per week       Relevant Medications   losartan-hydrochlorothiazide (HYZAAR) 50-12.5 MG tablet   Morbid obesity (HCC)    Chronic, worsening Body mass index is 36.8 kg/m. Discussed importance of healthy weight management Discussed diet and exercise       Osteoporosis screening    Due for DEXA Encourage walking for exercise Encourage use of MVI and calcium/Vit D supplement if diet is lacking      Relevant Orders   DG Bone Density   Prediabetes    Chronic, stable Repeat a1c Continue to recommend balanced, lower carb meals. Smaller meal size, adding snacks. Choosing water as drink of choice and increasing purposeful exercise.       Relevant Orders   Hemoglobin A1c (Completed)  Stress at home    Ongoing chronic health concerns in spouse, who is refusing follow up with neurology Patient denies concern for safety or abuse Has taken on a lot more responsibilities at home d/t his cognitive decline Believes this stress is causing her to eat/gain weight  Assisted by family Has thought about therapy but does not wish to start at this time        Return in about 6 weeks (around  10/03/2021) for HTN management.     Vonna Kotyk, FNP, have reviewed all documentation for this visit. The documentation on 08/23/21 for the exam, diagnosis, procedures, and orders are all accurate and complete.    Gwyneth Sprout, [place] [phone] (phone) [phone] (fax)  [place]

## 2021-08-23 ENCOUNTER — Encounter: Payer: Self-pay | Admitting: Family Medicine

## 2021-08-23 DIAGNOSIS — E1159 Type 2 diabetes mellitus with other circulatory complications: Secondary | ICD-10-CM | POA: Insufficient documentation

## 2021-08-23 DIAGNOSIS — I10 Essential (primary) hypertension: Secondary | ICD-10-CM | POA: Insufficient documentation

## 2021-08-23 LAB — CBC WITH DIFFERENTIAL/PLATELET
Basophils Absolute: 0.1 10*3/uL (ref 0.0–0.2)
Basos: 1 %
EOS (ABSOLUTE): 0.3 10*3/uL (ref 0.0–0.4)
Eos: 6 %
Hematocrit: 41.1 % (ref 34.0–46.6)
Hemoglobin: 13.8 g/dL (ref 11.1–15.9)
Immature Grans (Abs): 0 10*3/uL (ref 0.0–0.1)
Immature Granulocytes: 0 %
Lymphocytes Absolute: 1.5 10*3/uL (ref 0.7–3.1)
Lymphs: 27 %
MCH: 28.2 pg (ref 26.6–33.0)
MCHC: 33.6 g/dL (ref 31.5–35.7)
MCV: 84 fL (ref 79–97)
Monocytes Absolute: 0.5 10*3/uL (ref 0.1–0.9)
Monocytes: 9 %
Neutrophils Absolute: 3.2 10*3/uL (ref 1.4–7.0)
Neutrophils: 57 %
Platelets: 262 10*3/uL (ref 150–450)
RBC: 4.89 x10E6/uL (ref 3.77–5.28)
RDW: 13.2 % (ref 11.7–15.4)
WBC: 5.6 10*3/uL (ref 3.4–10.8)

## 2021-08-23 LAB — COMPREHENSIVE METABOLIC PANEL
ALT: 24 IU/L (ref 0–32)
AST: 21 IU/L (ref 0–40)
Albumin/Globulin Ratio: 1.6 (ref 1.2–2.2)
Albumin: 4 g/dL (ref 3.9–4.9)
Alkaline Phosphatase: 70 IU/L (ref 44–121)
BUN/Creatinine Ratio: 20 (ref 12–28)
BUN: 14 mg/dL (ref 8–27)
Bilirubin Total: 0.2 mg/dL (ref 0.0–1.2)
CO2: 24 mmol/L (ref 20–29)
Calcium: 9.3 mg/dL (ref 8.7–10.3)
Chloride: 105 mmol/L (ref 96–106)
Creatinine, Ser: 0.69 mg/dL (ref 0.57–1.00)
Globulin, Total: 2.5 g/dL (ref 1.5–4.5)
Glucose: 90 mg/dL (ref 70–99)
Potassium: 5 mmol/L (ref 3.5–5.2)
Sodium: 142 mmol/L (ref 134–144)
Total Protein: 6.5 g/dL (ref 6.0–8.5)
eGFR: 93 mL/min/{1.73_m2} (ref >59)

## 2021-08-23 LAB — LIPID PANEL
Chol/HDL Ratio: 5.4 ratio — ABNORMAL HIGH (ref 0.0–4.4)
Cholesterol, Total: 264 mg/dL — ABNORMAL HIGH (ref 100–199)
HDL: 49 mg/dL (ref >39)
LDL Chol Calc (NIH): 177 mg/dL — ABNORMAL HIGH (ref 0–99)
Triglycerides: 204 mg/dL — ABNORMAL HIGH (ref 0–149)
VLDL Cholesterol Cal: 38 mg/dL (ref 5–40)

## 2021-08-23 LAB — VITAMIN D 25 HYDROXY (VIT D DEFICIENCY, FRACTURES): Vit D, 25-Hydroxy: 47.3 ng/mL (ref 30.0–100.0)

## 2021-08-23 LAB — TSH+FREE T4
Free T4: 1.21 ng/dL (ref 0.82–1.77)
TSH: 2.08 u[IU]/mL (ref 0.450–4.500)

## 2021-08-23 LAB — HEMOGLOBIN A1C
Est. average glucose Bld gHb Est-mCnc: 137 mg/dL
Hgb A1c MFr Bld: 6.4 % — ABNORMAL HIGH (ref 4.8–5.6)

## 2021-08-23 MED ORDER — LOSARTAN POTASSIUM-HCTZ 50-12.5 MG PO TABS: 1.0000 | ORAL_TABLET | Freq: Every day | ORAL | 3 refills | Status: DC

## 2021-08-23 NOTE — Assessment & Plan Note (Signed)
Chronic, worsening Body mass index is 36.8 kg/m. Discussed importance of healthy weight management Discussed diet and exercise

## 2021-08-23 NOTE — Progress Notes (Signed)
A1c is slightly increased at 6.4%. Remains in pre-diabetic range. Continue to recommend balanced, lower carb meals. Smaller meal size, adding snacks. Choosing water as drink of choice and increasing purposeful exercise. Could add metformin once daily to assist if desired.  Cholesterol remains elevated; however, slightly improved. I recommend diet low in saturated fat and regular exercise - 30 min at least 5 times per week. Recommend trial of alternative statin, fish oil or alternative like zetia to assist.   The 10-year ASCVD risk score (Arnett DK, et al., 2019) is: 13.1%   Values used to calculate the score:     Age: 71 years     Sex: Female     Is Non-Hispanic African American: No     Diabetic: No     Tobacco smoker: No     Systolic Blood Pressure: 761 mmHg     Is BP treated: No     HDL Cholesterol: 49 mg/dL     Total Cholesterol: 264 mg/dL  Stroke/heart attack risk at 14% in 10 years, which is moderately elevated.   All other labs normal and stable.  Erica Pineda, [id number] [place], [place] [address] [phone] (phone) [phone] (fax) [place]

## 2021-08-23 NOTE — Assessment & Plan Note (Signed)
Chronic, stable F/b endocrine/oncology team Continues to use 125 mg synthroid daily Denies concerns outside of weight gain- believes this is related to stress from spouse/his health conditions

## 2021-08-23 NOTE — Assessment & Plan Note (Signed)
Repeat LP Not currently on statin; encourage repeat trial of different statin or injectable medication or alternative like zetia/fish oil Stroke/MI risk is elevated at 13% in 10 yearss The 10-year ASCVD risk score (Arnett DK, et al., 2019) is: 13.1%   Values used to calculate the score:     Age: 71 years     Sex: Female     Is Non-Hispanic African American: No     Diabetic: No     Tobacco smoker: No     Systolic Blood Pressure: 270 mmHg     Is BP treated: No     HDL Cholesterol: 49 mg/dL     Total Cholesterol: 264 mg/dL recommend diet low in saturated fat and regular exercise - 30 min at least 5 times per week

## 2021-08-23 NOTE — Assessment & Plan Note (Signed)
UTD on annual dental and vision Things to do to keep yourself healthy  - Exercise at least 30-45 minutes a day, 3-4 days a week.  - Eat a low-fat diet with lots of fruits and vegetables, up to 7-9 servings per day.  - Seatbelts can save your life. Wear them always.  - Smoke detectors on every level of your home, check batteries every year.  - Eye Doctor - have an eye exam every 1-2 years  - Safe sex - if you may be exposed to STDs, use a condom.  - Alcohol -  If you drink, do it moderately, less than 2 drinks per day.  - [place]. Choose someone to speak for you if you are not able.  - Depression is common in our stressful world.If you're feeling down or losing interest in things you normally enjoy, please come in for a visit.  - Violence - If anyone is threatening or hurting you, please call immediately.

## 2021-08-23 NOTE — Assessment & Plan Note (Signed)
Ongoing chronic health concerns in spouse, who is refusing follow up with neurology Patient denies concern for safety or abuse Has taken on a lot more responsibilities at home d/t his cognitive decline Believes this stress is causing her to eat/gain weight  Assisted by family Has thought about therapy but does not wish to start at this time

## 2021-08-23 NOTE — Assessment & Plan Note (Signed)
Due for DEXA Encourage walking for exercise Encourage use of MVI and calcium/Vit D supplement if diet is lacking

## 2021-08-23 NOTE — Assessment & Plan Note (Signed)
Chronic, stable Repeat a1c Continue to recommend balanced, lower carb meals. Smaller meal size, adding snacks. Choosing water as drink of choice and increasing purposeful exercise.

## 2021-08-23 NOTE — Assessment & Plan Note (Signed)
Chronic, stable Continues to use Vit D to assist Request for repeat labs

## 2021-08-23 NOTE — Assessment & Plan Note (Signed)
New diagnosis; encourage use of medication to assist given ongoing stress at home and weight gain given previous intolerance of statin to prevent heart attack/stroke encoruage follow up in 1 month-1.5 months to follow up

## 2021-08-24 ENCOUNTER — Telehealth: Payer: Self-pay | Admitting: Family Medicine

## 2021-08-24 NOTE — Telephone Encounter (Signed)
Copied from [place]. Topic: Quick Communication - Lab Results (Clinic Use ONLY) >> Aug 24, 2021  3:58 PM Erica Pineda wrote: Called patient to inform them of recent lab results. When patient returns call, triage nurse may disclose results. >> Aug 24, 2021  5:27 PM Erica Pineda wrote: Pt returned call for lab results. Attempted to transfer to NT but unavailable at the time. Pt requests return call.

## 2021-08-24 NOTE — Telephone Encounter (Signed)
Returned pt's call to give lab results. LVMTCB to discuss when available.

## 2021-08-25 NOTE — Telephone Encounter (Signed)
Called, no answer. Left vm to call back for lab results.

## 2021-08-25 NOTE — Telephone Encounter (Signed)
Called pt, no answer. Left VM to call back for lab results.

## 2021-08-26 NOTE — Progress Notes (Signed)
Called pt left VM to call back. ? ?PEC nurse may give results to patient if they return call to clinic, a CRM has been created. ? ?KP

## 2021-08-31 ENCOUNTER — Ambulatory Visit: Payer: Medicare PPO | Admitting: Dermatology

## 2021-09-07 ENCOUNTER — Ambulatory Visit: Payer: Medicare PPO | Admitting: Dermatology

## 2021-09-07 DIAGNOSIS — L814 Other melanin hyperpigmentation: Secondary | ICD-10-CM | POA: Diagnosis not present

## 2021-09-07 DIAGNOSIS — L821 Other seborrheic keratosis: Secondary | ICD-10-CM

## 2021-09-07 DIAGNOSIS — L720 Epidermal cyst: Secondary | ICD-10-CM | POA: Diagnosis not present

## 2021-09-07 NOTE — Progress Notes (Signed)
   Follow-Up Visit   Subjective  Erica Pineda is a 71 y.o. female who presents for the following: Bumps (Upper eyelids that came up in June, bothersome to patient). Bumps have gone down in size some since they first came up.   The following portions of the chart were reviewed this encounter and updated as appropriate:       Review of Systems:  No other skin or systemic complaints except as noted in HPI or Assessment and Plan.  Objective  Well appearing patient in no apparent distress; mood and affect are within normal limits.  A focused examination was performed including face. Relevant physical exam findings are noted in the Assessment and Plan.  Left Upper Eyelid, R nasal root/R medial canthus (2) 3.0 mm flesh white firm papule of the right nasal root with adjacent 1.5 mm firm white flesh papule, also L med upper eyelid    Assessment & Plan  Epidermal cyst (3) Left Upper Eyelid; R nasal root/R medial canthus (2)  Symptomatic, irritating, patient would like treated.   Extraction x 2 performed today to the right nasal root/medial canthus. If recurs, discussed punch excison to larger cyst.   Acne/Milia surgery - R nasal root/R medial canthus Procedure risks and benefits were discussed with the patient and verbal consent was obtained. Following prep of the skin on the right nasal root/medial canthus with an alcohol swab and local anesthesia injection, extraction of milia/cyst x 2 was performed with cotton swabs and comedone extractor following superficial incision made over their surfaces with a #11 blade. Capillary hemostasis was achieved with 20% aluminum chloride solution. Vaseline ointment was applied to each site. The patient tolerated the procedure well.  Seborrheic Keratoses - Stuck-on, waxy, tan-brown papules face, upper eyelids - Benign-appearing - Discussed benign etiology and prognosis. - Observe - Call for any changes  Lentigines - Scattered tan macules face - Due to  sun exposure - Benign-appering, observe - Recommend daily broad spectrum sunscreen SPF 30+ to sun-exposed areas, reapply every 2 hours as needed. - Call for any changes     Return as scheduled, for TBSE.  IJamesetta Orleans, CMA, am acting as scribe for Brendolyn Patty, MD .  Documentation: I have reviewed the above documentation for accuracy and completeness, and I agree with the above.  Brendolyn Patty MD

## 2021-09-07 NOTE — Patient Instructions (Signed)
Due to recent changes in healthcare laws, you may see results of your pathology and/or laboratory studies on MyChart before the doctors have had a chance to review them. We understand that in some cases there may be results that are confusing or concerning to you. Please understand that not all results are received at the same time and often the doctors may need to interpret multiple results in order to provide you with the best plan of care or course of treatment. Therefore, we ask that you please give us 2 business days to thoroughly review all your results before contacting the office for clarification. Should we see a critical lab result, you will be contacted sooner.   If You Need Anything After Your Visit  If you have any questions or concerns for your doctor, please call our main line at [phone] and press option 4 to reach your doctor's medical assistant. If no one answers, please leave a voicemail as directed and we will return your call as soon as possible. Messages left after 4 pm will be answered the following business day.   You may also send us a message via MyChart. We typically respond to MyChart messages within 1-2 business days.  For prescription refills, please ask your pharmacy to contact our office. Our fax number is [phone].  If you have an urgent issue when the clinic is closed that cannot wait until the next business day, you can page your doctor at the number below.    Please note that while we do our best to be available for urgent issues outside of office hours, we are not available 24/7.   If you have an urgent issue and are unable to reach us, you may choose to seek medical care at your doctor's office, retail clinic, urgent care center, or emergency room.  If you have a medical emergency, please immediately call 911 or go to the emergency department.  Pager Numbers  - Dr. Kowalski: [phone]  - Dr. Moye: [phone]  - Dr. Stewart:  [phone]  In the event of inclement weather, please call our main line at [phone] for an update on the status of any delays or closures.  Dermatology Medication Tips: Please keep the boxes that topical medications come in in order to help keep track of the instructions about where and how to use these. Pharmacies typically print the medication instructions only on the boxes and not directly on the medication tubes.   If your medication is too expensive, please contact our office at [phone] option 4 or send us a message through MyChart.   We are unable to tell what your co-pay for medications will be in advance as this is different depending on your insurance coverage. However, we may be able to find a substitute medication at lower cost or fill out paperwork to get insurance to cover a needed medication.   If a prior authorization is required to get your medication covered by your insurance company, please allow us 1-2 business days to complete this process.  Drug prices often vary depending on where the prescription is filled and some pharmacies may offer cheaper prices.  The website www.goodrx.com contains coupons for medications through different pharmacies. The prices here do not account for what the cost may be with help from insurance (it may be cheaper with your insurance), but the website can give you the price if you did not use any insurance.  - You can print the associated coupon and take it with   your prescription to the pharmacy.  - You may also stop by our office during regular business hours and pick up a GoodRx coupon card.  - If you need your prescription sent electronically to a different pharmacy, notify our office through [place] MyChart or by phone at [phone] option 4.     Si Usted Necesita Algo Despus de Su Visita  Tambin puede enviarnos un mensaje a travs de MyChart. Por lo general respondemos a los mensajes de MyChart en el transcurso de 1 a 2  das hbiles.  Para renovar recetas, por favor pida a su farmacia que se ponga en contacto con nuestra oficina. Nuestro nmero de fax es el [phone].  Si tiene un asunto urgente cuando la clnica est cerrada y que no puede esperar hasta el siguiente da hbil, puede llamar/localizar a su doctor(a) al nmero que aparece a continuacin.   Por favor, tenga en cuenta que aunque hacemos todo lo posible para estar disponibles para asuntos urgentes fuera del horario de oficina, no estamos disponibles las 24 horas del da, los 7 das de la semana.   Si tiene un problema urgente y no puede comunicarse con nosotros, puede optar por buscar atencin mdica  en el consultorio de su doctor(a), en una clnica privada, en un centro de atencin urgente o en una sala de emergencias.  Si tiene una emergencia mdica, por favor llame inmediatamente al 911 o vaya a la sala de emergencias.  Nmeros de bper  - Dr. Kowalski: [phone]  - Dra. Moye: [phone]  - Dra. Stewart: [phone]  En caso de inclemencias del tiempo, por favor llame a nuestra lnea principal al [phone] para una actualizacin sobre el estado de cualquier retraso o cierre.  Consejos para la medicacin en dermatologa: Por favor, guarde las cajas en las que vienen los medicamentos de uso tpico para ayudarle a seguir las instrucciones sobre dnde y cmo usarlos. Las farmacias generalmente imprimen las instrucciones del medicamento slo en las cajas y no directamente en los tubos del medicamento.   Si su medicamento es muy caro, por favor, pngase en contacto con nuestra oficina llamando al [phone] y presione la opcin 4 o envenos un mensaje a travs de MyChart.   No podemos decirle cul ser su copago por los medicamentos por adelantado ya que esto es diferente dependiendo de la cobertura de su seguro. Sin embargo, es posible que podamos encontrar un medicamento sustituto a menor costo o llenar un formulario para que el  seguro cubra el medicamento que se considera necesario.   Si se requiere una autorizacin previa para que su compaa de seguros cubra su medicamento, por favor permtanos de 1 a 2 das hbiles para completar este proceso.  Los precios de los medicamentos varan con frecuencia dependiendo del lugar de dnde se surte la receta y alguna farmacias pueden ofrecer precios ms baratos.  El sitio web www.goodrx.com tiene cupones para medicamentos de diferentes farmacias. Los precios aqu no tienen en cuenta lo que podra costar con la ayuda del seguro (puede ser ms barato con su seguro), pero el sitio web puede darle el precio si no utiliz ningn seguro.  - Puede imprimir el cupn correspondiente y llevarlo con su receta a la farmacia.  - Tambin puede pasar por nuestra oficina durante el horario de atencin regular y recoger una tarjeta de cupones de GoodRx.  - Si necesita que su receta se enve electrnicamente a una farmacia diferente, informe a nuestra oficina a travs de MyChart de    o por telfono llamando al [phone] y presione la opcin 4.  

## 2021-10-15 IMAGING — US US SOFT TISSUE HEAD/NECK
1 series · 14 of 15 positions shown · non-contrast
Comparison: None.

CLINICAL DATA: 69-year-old female with swelling

EXAM:
ULTRASOUND OF HEAD/NECK SOFT TISSUES
TECHNIQUE: Ultrasound examination of the head and neck soft tissues was
performed in the area of clinical concern.

[Series 1: us soft tissue head/neck · 0.07mm/px · 14 of 15 slices shown]
[im 1/15]
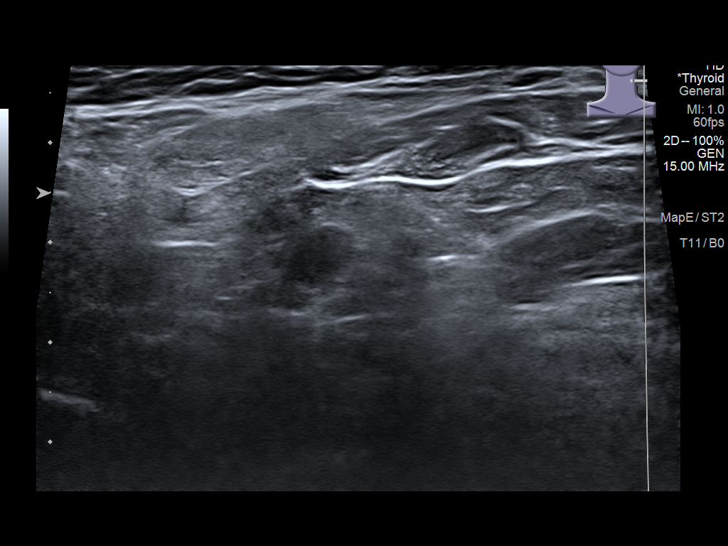
[im 2/15]
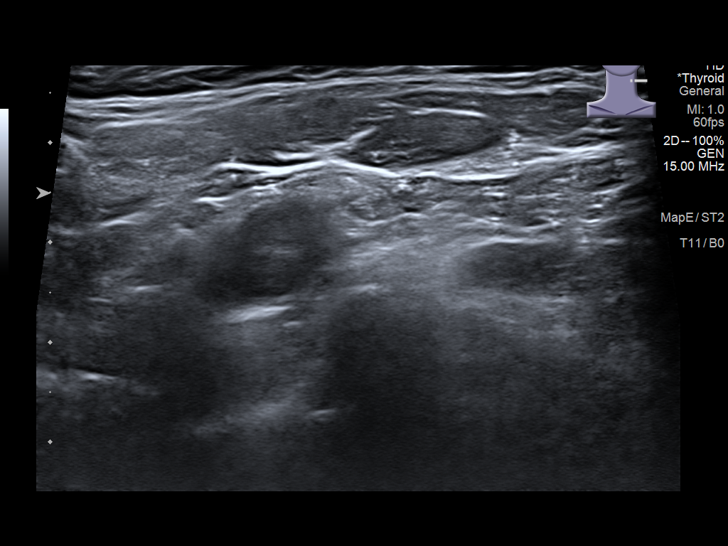
[im 3/15]
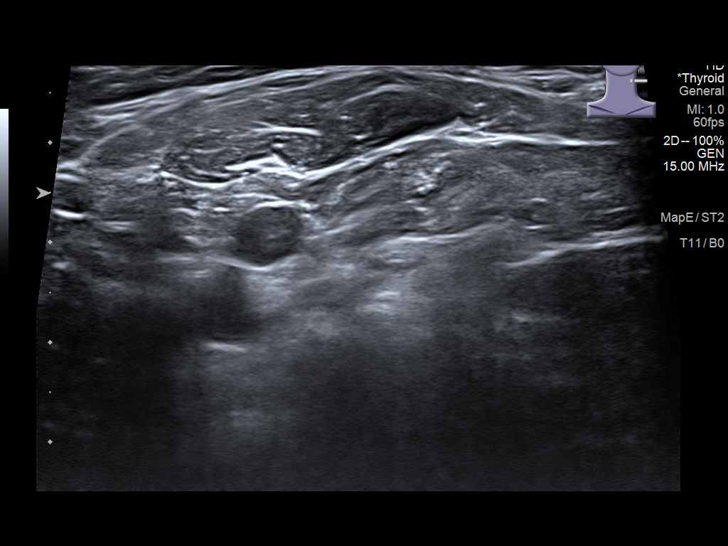
[im 4/15]
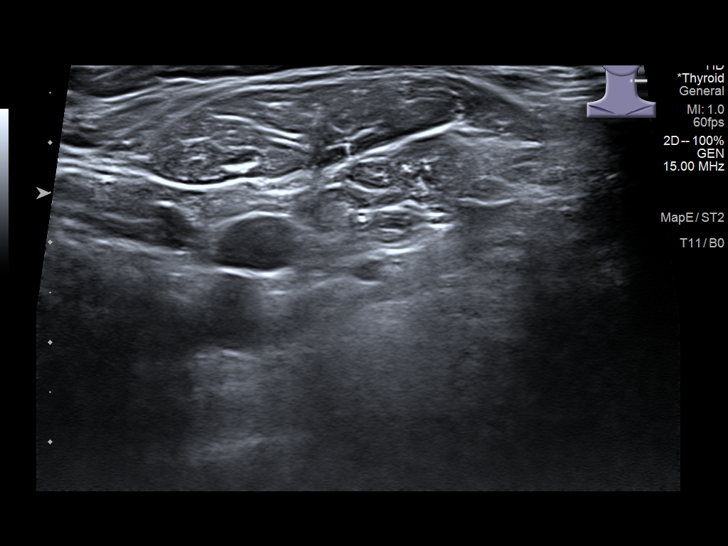
[im 5/15]
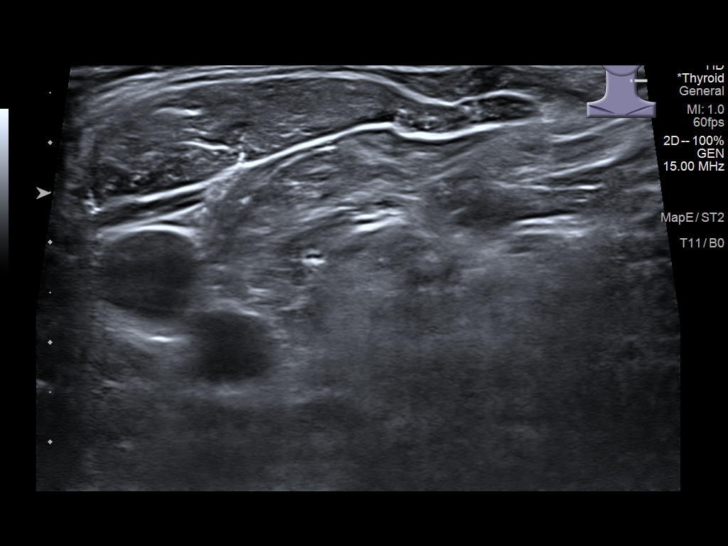
[im 6/15]
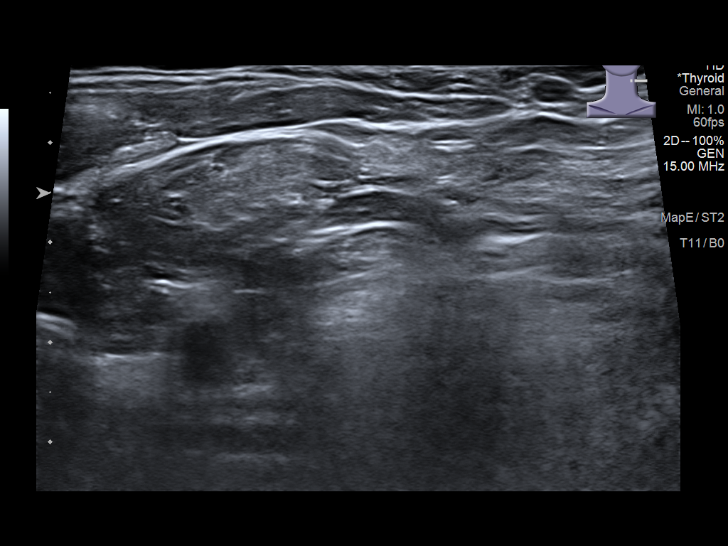
[im 7/15]
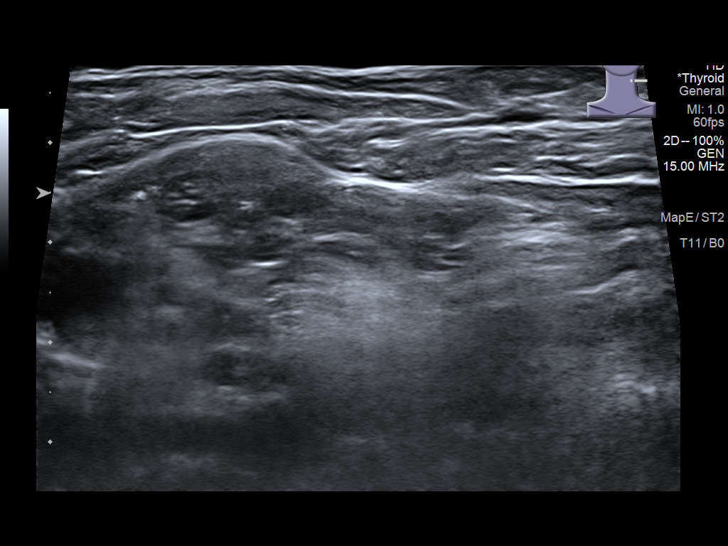
[im 9/15]
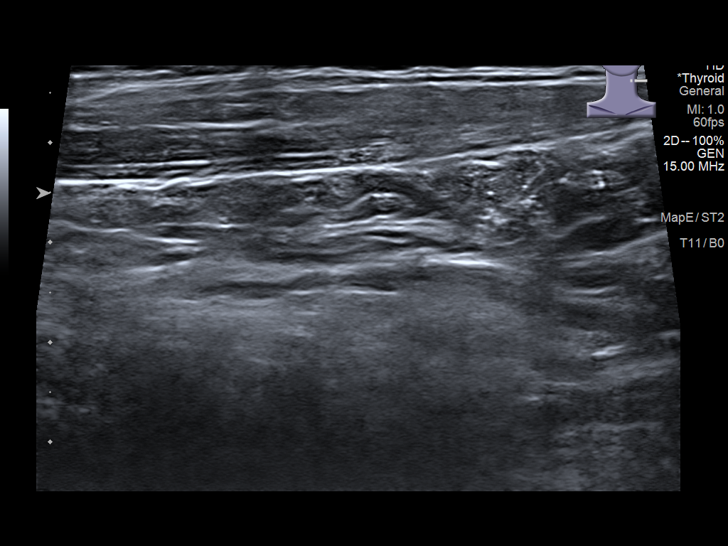
[im 10/15]
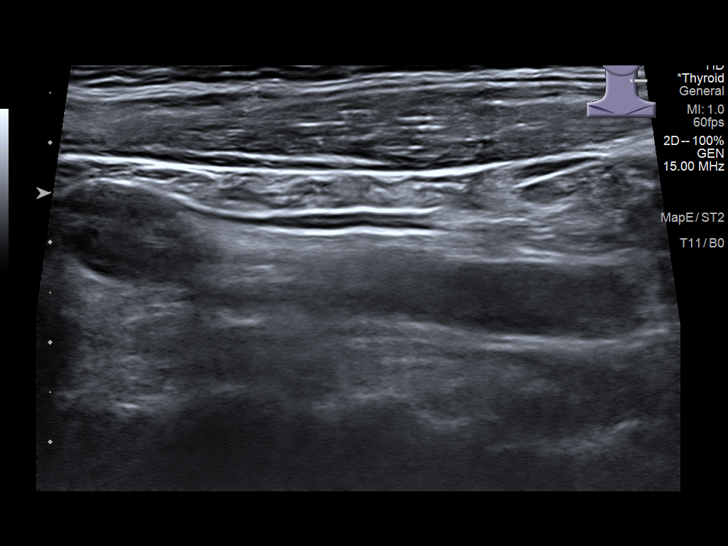
[im 11/15]
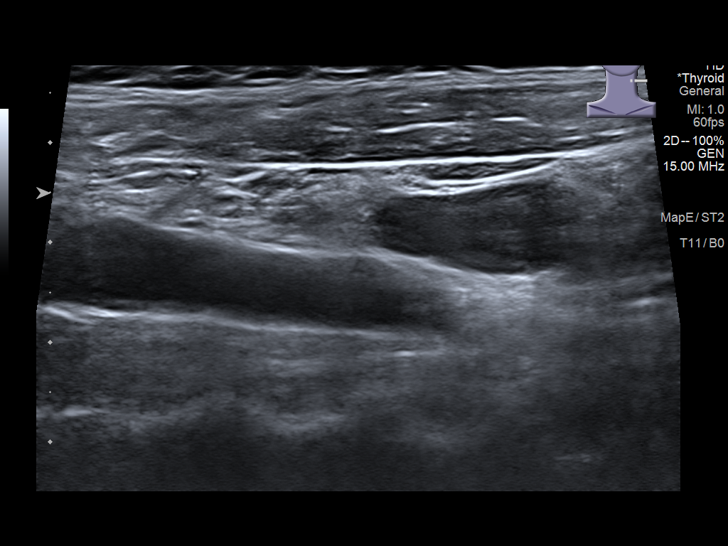
[im 12/15]
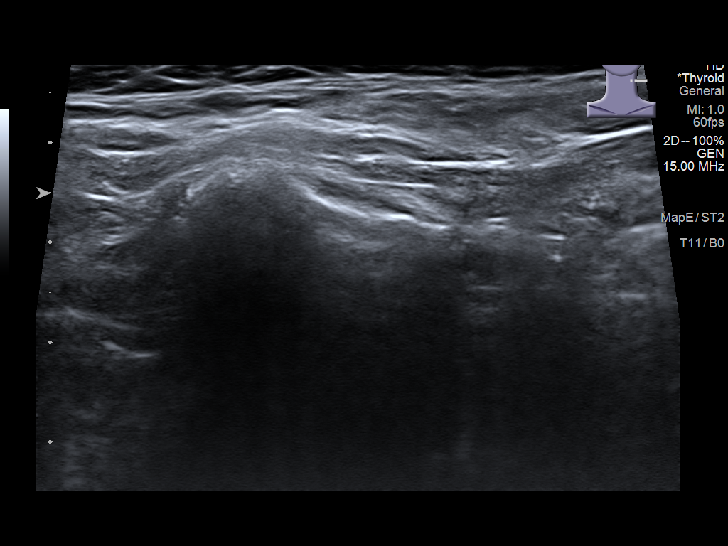
[im 13/15]
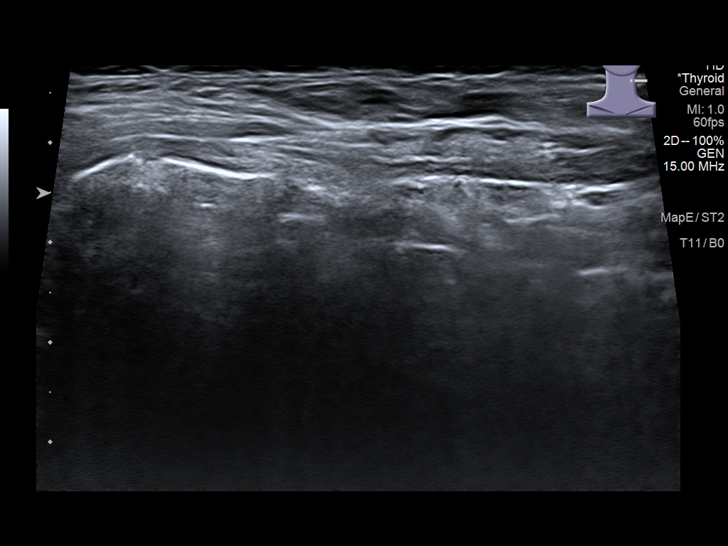
[im 14/15]
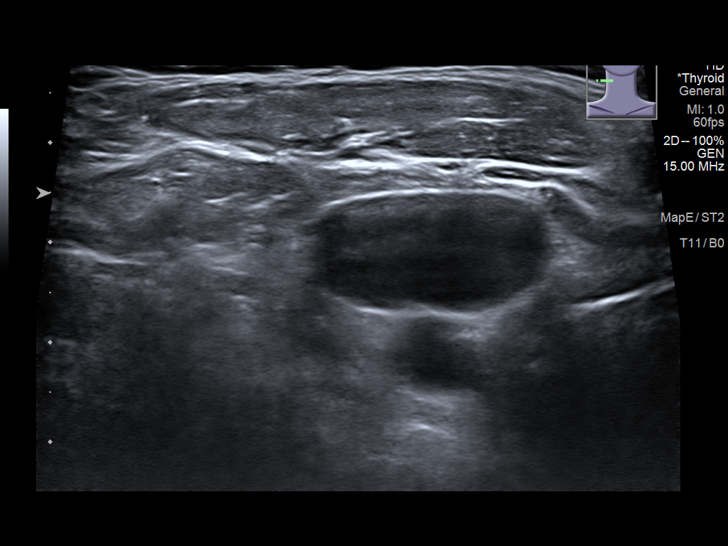
[im 15/15]
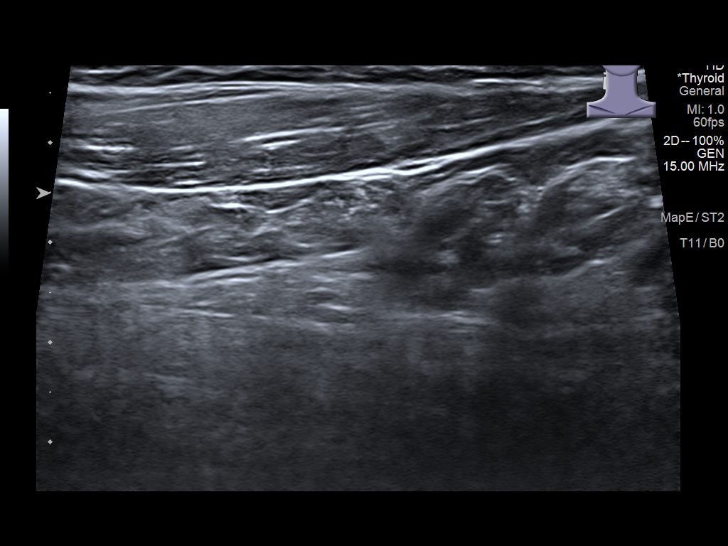

[14 of 15 positions shown; findings below may reference images not displayed]

FINDINGS: Grayscale and color duplex performed in the region of clinical
concern.

No lymphadenopathy.  No focal fluid.  No soft tissue lesion.
IMPRESSION: Unremarkable sonographic survey in the region of clinical concern

## 2021-10-24 DIAGNOSIS — H02831 Dermatochalasis of right upper eyelid: Secondary | ICD-10-CM | POA: Insufficient documentation

## 2021-10-25 LAB — HM MAMMOGRAPHY

## 2021-11-03 ENCOUNTER — Other Ambulatory Visit: Payer: Self-pay | Admitting: Family Medicine

## 2021-11-03 DIAGNOSIS — E559 Vitamin D deficiency, unspecified: Secondary | ICD-10-CM

## 2021-11-08 ENCOUNTER — Ambulatory Visit: Payer: Medicare PPO | Admitting: Dermatology

## 2021-11-08 DIAGNOSIS — L821 Other seborrheic keratosis: Secondary | ICD-10-CM

## 2021-11-08 DIAGNOSIS — L578 Other skin changes due to chronic exposure to nonionizing radiation: Secondary | ICD-10-CM | POA: Diagnosis not present

## 2021-11-08 DIAGNOSIS — R238 Other skin changes: Secondary | ICD-10-CM | POA: Diagnosis not present

## 2021-11-08 DIAGNOSIS — L814 Other melanin hyperpigmentation: Secondary | ICD-10-CM

## 2021-11-08 DIAGNOSIS — L719 Rosacea, unspecified: Secondary | ICD-10-CM

## 2021-11-08 DIAGNOSIS — L820 Inflamed seborrheic keratosis: Secondary | ICD-10-CM

## 2021-11-08 DIAGNOSIS — Z1283 Encounter for screening for malignant neoplasm of skin: Secondary | ICD-10-CM | POA: Diagnosis not present

## 2021-11-08 DIAGNOSIS — D1801 Hemangioma of skin and subcutaneous tissue: Secondary | ICD-10-CM

## 2021-11-08 MED ORDER — AZELAIC ACID 15 % EX GEL: CUTANEOUS | 5 refills | Status: DC

## 2021-11-08 NOTE — Patient Instructions (Addendum)
Cerave anti itch for itchy areas on body (back)  Doxycycline should be taken with food to prevent nausea. Do not lay down for 30 minutes after taking. Be cautious with sun exposure and use good sun protection while on this medication. Pregnant women should not take this medication.    Cryotherapy Aftercare  Wash gently with soap and water everyday.   Apply Vaseline and Band-Aid daily until healed.     Melanoma ABCDEs  Melanoma is the most dangerous type of skin cancer, and is the leading cause of death from skin disease.  You are more likely to develop melanoma if you: Have light-colored skin, light-colored eyes, or red or blond hair Spend a lot of time in the sun Tan regularly, either outdoors or in a tanning bed Have had blistering sunburns, especially during childhood Have a close family member who has had a melanoma Have atypical moles or large birthmarks  Early detection of melanoma is key since treatment is typically straightforward and cure rates are extremely high if we catch it early.   The first sign of melanoma is often a change in a mole or a new dark spot.  The ABCDE system is a way of remembering the signs of melanoma.  A for asymmetry:  The two halves do not match. B for border:  The edges of the growth are irregular. C for color:  A mixture of colors are present instead of an even brown color. D for diameter:  Melanomas are usually (but not always) greater than 52m - the size of a pencil eraser. E for evolution:  The spot keeps changing in size, shape, and color.  Please check your skin once per month between visits. You can use a small mirror in front and a large mirror behind you to keep an eye on the back side or your body.   If you see any new or changing lesions before your next follow-up, please call to schedule a visit.  Please continue daily skin protection including broad spectrum sunscreen SPF 30+ to sun-exposed areas, reapplying every 2 hours as needed  when you're outdoors.   Staying in the shade or wearing long sleeves, sun glasses (UVA+UVB protection) and wide brim hats (4-inch brim around the entire circumference of the hat) are also recommended for sun protection.    Due to recent changes in healthcare laws, you may see results of your pathology and/or laboratory studies on MyChart before the doctors have had a chance to review them. We understand that in some cases there may be results that are confusing or concerning to you. Please understand that not all results are received at the same time and often the doctors may need to interpret multiple results in order to provide you with the best plan of care or course of treatment. Therefore, we ask that you please give u[place]2 business days to thoroughly review all your results before contacting the office for clarification. Should we see a critical lab result, you will be contacted sooner.   If You Need Anything After Your Visit  If you have any questions or concerns for your doctor, please call our main line at [phone] press option 4 to reach your doctor's medical assistant. If no one answers, please leave a voicemail as directed and we will return your call as soon as possible. Messages left after 4 pm will be answered the following business day.   You may also send [place] message via [facility] We typically respond  to MyChart messages within 1-2 business days.  For prescription refills, please ask your pharmacy to contact our office. Our fax number is [phone].  If you have an urgent issue when the clinic is closed that cannot wait until the next business day, you can page your doctor at the number below.    Please note that while we do our best to be available for urgent issues outside of office hours, we are not available 24/7.   If you have an urgent issue and are unable to reach [place], you may choose to seek medical care at your doctor's office, retail clinic, urgent care center, or  emergency room.  If you have a medical emergency, please immediately call 911 or go to the emergency department.  Pager Numbers  - Dr. Nehemiah Massed: [phone]  - Dr. Laurence Ferrari: [phone]  - Dr. Nicole Kindred: [phone]  In the event of inclement weather, please call our main line at [phone] for an update on the status of any delays or closures.  Dermatology Medication Tips: Please keep the boxes that topical medications come in in order to help keep track of the instructions about where and how to use these. Pharmacies typically print the medication instructions only on the boxes and not directly on the medication tubes.   If your medication is too expensive, please contact our office at [phone] option 4 or send [place] a message through [place].   We are unable to tell what your co-pay for medications will be in advance as this is different depending on your insurance coverage. However, we may be able to find a substitute medication at lower cost or fill out paperwork to get insurance to cover a needed medication.   If a prior authorization is required to get your medication covered by your insurance company, please allow [place] 1-2 business days to complete this process.  Drug prices often vary depending on where the prescription is filled and some pharmacies may offer cheaper prices.  The website www.goodrx.com contains coupons for medications through different pharmacies. The prices here do not account for what the cost may be with help from insurance (it may be cheaper with your insurance), but the website can give you the price if you did not use any insurance.  - You can print the associated coupon and take it with your prescription to the pharmacy.  - You may also stop by our office during regular business hours and pick up a GoodRx coupon card.  - If you need your prescription sent electronically to a different pharmacy, notify our office through [facility] or by phone at  [phone] option 4.     Si Usted Necesita Algo Despus de Su Visita  Tambin puede enviarnos un mensaje a travs de Pharmacist, community. Por lo general respondemos a los mensajes de MyChart en el transcurso de 1 a 2 das hbiles.  Para renovar recetas, por favor pida a su farmacia que se ponga en contacto con nuestra oficina. Harland Dingwall de fax es [place] [phone].  Si tiene un asunto urgente cuando la clnica est cerrada y que no puede esperar hasta el siguiente da hbil, puede llamar/localizar a su doctor(a) al nmero que aparece a continuacin.   Por favor, tenga en cuenta que aunque hacemos todo lo posible para estar disponibles para asuntos urgentes fuera del horario de [place], no estamos disponibles las 24 horas del da, los 7 das de la [place].   Si tiene un problema urgente y no puede comunicarse con nosotros,  puede optar por buscar atencin mdica  en el consultorio de su doctor(a), en una clnica privada, en un centro de atencin urgente o en una sala de emergencias.  Si tiene Engineering geologist, por favor llame inmediatamente al 911 o vaya a la sala de emergencias.  Nmeros de bper  - Dr. Nehemiah Massed: [phone]  - Dra. Moye: [phone]  - Dra. Nicole Kindred: [phone]  En caso de inclemencias del [place], por favor llame a Johnsie Kindred principal al [phone] para una actualizacin sobre el [place] de cualquier retraso o cierre.  Consejos para la medicacin en dermatologa: Por favor, guarde las cajas en las que vienen los medicamentos de uso tpico para ayudarle a seguir las instrucciones sobre dnde y cmo usarlos. Las farmacias generalmente imprimen las instrucciones del medicamento slo en las cajas y no directamente en los tubos del [place].   Si su medicamento es muy caro, por favor, pngase en contacto con Zigmund Daniel llamando al [phone] y presione la opcin 4 o envenos un mensaje a travs de Pharmacist, community.   No podemos decirle cul ser su copago por los  medicamentos por adelantado ya que esto es diferente dependiendo de la cobertura de su seguro. Sin embargo, es posible que podamos encontrar un medicamento sustituto a Electrical engineer un formulario para que el seguro cubra el medicamento que se considera necesario.   Si se requiere una autorizacin previa para que su compaa de seguros [place] su medicamento, por favor permtanos de 1 a 2 das hbiles para completar este proceso.  Los precios de los medicamentos varan con frecuencia dependiendo del Environmental consultant de dnde se surte la receta y alguna farmacias pueden ofrecer precios ms baratos.  El sitio web www.goodrx.com tiene cupones para medicamentos de Airline pilot. Los precios aqu no tienen en cuenta lo que podra costar con la ayuda del seguro (puede ser ms barato con su seguro), pero el sitio web puede darle el precio si no utiliz Research scientist (physical sciences).  - Puede imprimir el cupn correspondiente y llevarlo con su receta a la farmacia.  - Tambin puede pasar por nuestra oficina durante el horario de atencin regular y Charity fundraiser una tarjeta de cupones de GoodRx.  - Si necesita que su receta se enve electrnicamente a una farmacia diferente, informe a nuestra oficina a travs de MyChart de [place] o por telfono llamando al [phone] y presione la opcin 4.

## 2021-11-08 NOTE — Progress Notes (Signed)
Follow-Up Visit   Subjective  Erica Pineda is a 71 y.o. female who presents for the following: Annual Exam (Tbse, patch at left side of nose, itchy spot at left side of back near bra line. ).  She says the rosacea is well controlled with oral low-dose doxycycline and topical azelaic acid gel.  The doxycycline especially helps control her eye symptoms.   The patient presents for Total-Body Skin Exam (TBSE) for skin cancer screening and mole check.  The patient has spots, moles and lesions to be evaluated, some may be new or changing and the patient has concerns that these could be cancer.   The following portions of the chart were reviewed this encounter and updated as appropriate:      Review of Systems: No other skin or systemic complaints except as noted in HPI or Assessment and Plan.   Objective  Well appearing patient in no apparent distress; mood and affect are within normal limits.  A full examination was performed including scalp, head, eyes, ears, nose, lips, neck, chest, axillae, abdomen, back, buttocks, bilateral upper extremities, bilateral lower extremities, hands, feet, fingers, toes, fingernails, and toenails. All findings within normal limits unless otherwise noted below.  left nasal dorsum x 1 Pink scaly papule  right lower lip 5.60m soft purple papule.   Assessment & Plan  Inflamed seborrheic keratosis left nasal dorsum x 1  Vs fibrous papule   Symptomatic, irritating, patient would like treated.  Destruction of lesion - left nasal dorsum x 1  Destruction method: cryotherapy   Informed consent: discussed and consent obtained   Lesion destroyed using liquid nitrogen: Yes   Region frozen until ice ball extended beyond lesion: Yes   Outcome: patient tolerated procedure well with no complications   Post-procedure details: wound care instructions given   Additional details:  Prior to procedure, discussed risks of blister formation, small wound, skin  dyspigmentation, or rare scar following cryotherapy. Recommend Vaseline ointment to treated areas while healing.   Rosacea mid face, eyes, nose  With occular symptoms. Chronic condition with duration or expected duration over one year. Currently well-controlled.    Rosacea is a chronic progressive skin condition usually affecting the face of adults, causing redness and/or acne bumps. It is treatable but not curable. It sometimes affects the eyes (ocular rosacea) as well. It may respond to topical and/or systemic medication and can flare with stress, sun exposure, alcohol, exercise and some foods.  Daily application of broad spectrum spf 30+ sunscreen to face is recommended to reduce flares.   Continue azelaic acid 15 % gel apply topically to face 1 - 2 times a day for rosacea    Continue doxycycline 20 mg tablet po bid for ocular symptoms   Doxycycline should be taken with food to prevent nausea. Do not lay down for 30 minutes after taking. Be cautious with sun exposure and use good sun protection while on this medication. Pregnant women should not take this medication.     Related Medications doxycycline (PERIOSTAT) 20 MG tablet TAKE ONE TABLET BY MOUTH TWICE A DAY  Azelaic Acid 15 % gel Apply to face 1-2 times a day for rosacea.  Venous lake right lower lip  Benign, observe.   Discussed shave removal with cauterization if becomes more irritated.  May leave white scar and may recur.  Patient prefers observation at this time   Lentigines - Scattered tan macules - Due to sun exposure - Benign-appearing, observe - Recommend daily broad spectrum sunscreen SPF  30+ to sun-exposed areas, reapply every 2 hours as needed. - Call for any changes  Seborrheic Keratoses At left breast - Stuck-on, waxy, tan-brown papules and/or plaques  - Benign-appearing - Discussed benign etiology and prognosis. - Observe - Call for any changes  Melanocytic Nevi - Tan-brown and/or  pink-flesh-colored symmetric macules and papules - Benign appearing on exam today - Observation - Call clinic for new or changing moles - Recommend daily use of broad spectrum spf 30+ sunscreen to sun-exposed areas.   Hemangiomas - Red papules - Discussed benign nature - Observe - Call for any changes  Actinic Damage - Chronic condition, secondary to cumulative UV/sun exposure - diffuse scaly erythematous macules with underlying dyspigmentation - Recommend daily broad spectrum sunscreen SPF 30+ to sun-exposed areas, reapply every 2 hours as needed.  - Staying in the shade or wearing long sleeves, sun glasses (UVA+UVB protection) and wide brim hats (4-inch brim around the entire circumference of the hat) are also recommended for sun protection.  - Call for new or changing lesions.  Skin cancer screening performed today. Return in about 1 year (around 11/09/2022) for TBSE. I, Ruthell Rummage, CMA, am acting as scribe for Brendolyn Patty, MD.  Documentation: I have reviewed the above documentation for accuracy and completeness, and I agree with the above.  Brendolyn Patty MD

## 2021-12-06 ENCOUNTER — Encounter: Payer: Self-pay | Admitting: Family Medicine

## 2021-12-15 ENCOUNTER — Telehealth: Payer: Self-pay

## 2021-12-15 ENCOUNTER — Other Ambulatory Visit: Payer: Self-pay | Admitting: Family Medicine

## 2021-12-15 DIAGNOSIS — E559 Vitamin D deficiency, unspecified: Secondary | ICD-10-CM

## 2021-12-15 DIAGNOSIS — R7303 Prediabetes: Secondary | ICD-10-CM

## 2021-12-15 DIAGNOSIS — I10 Essential (primary) hypertension: Secondary | ICD-10-CM

## 2021-12-15 DIAGNOSIS — E039 Hypothyroidism, unspecified: Secondary | ICD-10-CM

## 2021-12-15 DIAGNOSIS — E7800 Pure hypercholesterolemia, unspecified: Secondary | ICD-10-CM

## 2021-12-15 NOTE — Telephone Encounter (Signed)
Patient was advise. 

## 2021-12-15 NOTE — Telephone Encounter (Signed)
Please advise 

## 2021-12-15 NOTE — Telephone Encounter (Signed)
Copied from [place] [phone]. Topic: General - Other >> Dec 15, 2021  2:00 PM Leitha Schuller wrote: Pt requesting an order for labs prior to fu appt on 11-27  Pt requesting a cb once orders has been submitted

## 2021-12-17 LAB — COMPREHENSIVE METABOLIC PANEL
ALT: 26 IU/L (ref 0–32)
AST: 30 IU/L (ref 0–40)
Albumin/Globulin Ratio: 2 (ref 1.2–2.2)
Albumin: 4.4 g/dL (ref 3.9–4.9)
Alkaline Phosphatase: 67 IU/L (ref 44–121)
BUN/Creatinine Ratio: 28 (ref 12–28)
BUN: 20 mg/dL (ref 8–27)
Bilirubin Total: 0.4 mg/dL (ref 0.0–1.2)
CO2: 25 mmol/L (ref 20–29)
Calcium: 9.6 mg/dL (ref 8.7–10.3)
Chloride: 101 mmol/L (ref 96–106)
Creatinine, Ser: 0.72 mg/dL (ref 0.57–1.00)
Globulin, Total: 2.2 g/dL (ref 1.5–4.5)
Glucose: 104 mg/dL — ABNORMAL HIGH (ref 70–99)
Potassium: 5 mmol/L (ref 3.5–5.2)
Sodium: 140 mmol/L (ref 134–144)
Total Protein: 6.6 g/dL (ref 6.0–8.5)
eGFR: 90 mL/min/{1.73_m2} (ref >59)

## 2021-12-17 LAB — TSH+FREE T4
Free T4: 1.22 ng/dL (ref 0.82–1.77)
TSH: 6.55 u[IU]/mL — ABNORMAL HIGH (ref 0.450–4.500)

## 2021-12-17 LAB — LIPID PANEL
Chol/HDL Ratio: 4.8 ratio — ABNORMAL HIGH (ref 0.0–4.4)
Cholesterol, Total: 272 mg/dL — ABNORMAL HIGH (ref 100–199)
HDL: 57 mg/dL (ref >39)
LDL Chol Calc (NIH): 173 mg/dL — ABNORMAL HIGH (ref 0–99)
Triglycerides: 223 mg/dL — ABNORMAL HIGH (ref 0–149)
VLDL Cholesterol Cal: 42 mg/dL — ABNORMAL HIGH (ref 5–40)

## 2021-12-17 LAB — HEMOGLOBIN A1C
Est. average glucose Bld gHb Est-mCnc: 137 mg/dL
Hgb A1c MFr Bld: 6.4 % — ABNORMAL HIGH (ref 4.8–5.6)

## 2021-12-17 LAB — VITAMIN D 25 HYDROXY (VIT D DEFICIENCY, FRACTURES): Vit D, 25-Hydroxy: 48.4 ng/mL (ref 30.0–100.0)

## 2021-12-18 NOTE — Progress Notes (Signed)
Cholesterol is slightly increased; moderate-high stroke risk at 13.3% The 10-year ASCVD risk score (Arnett DK, et al., 2019) is: 13.3%   Values used to calculate the score:     Age: 71 years     Sex: Female     Is Non-Hispanic African American: No     Diabetic: No     Tobacco smoker: No     Systolic Blood Pressure: 798 mmHg     Is BP treated: Yes     HDL Cholesterol: 57 mg/dL     Total Cholesterol: 272 mg/dL  TSH shows slight subacute hypothyroidism.  A1c remains stable at cusp of DM/prediabetes.

## 2021-12-21 ENCOUNTER — Encounter: Payer: Self-pay | Admitting: *Deleted

## 2021-12-26 ENCOUNTER — Ambulatory Visit: Payer: Medicare PPO | Admitting: Family Medicine

## 2021-12-29 ENCOUNTER — Other Ambulatory Visit: Payer: Self-pay | Admitting: Family Medicine

## 2021-12-29 DIAGNOSIS — I10 Essential (primary) hypertension: Secondary | ICD-10-CM

## 2022-01-04 NOTE — Progress Notes (Signed)
I,Sulibeya S Dimas,acting as a Neurosurgeon for [name], MD.,have documented all relevant documentation on the behalf of [name], MD,as directed by  [name], MD while in the presence of [name], MD.     Established patient visit   Patient: Erica Pineda   DOB: 1950/10/11   71 y.o. Female  MRN: [id number] Visit Date: 01/05/2022  Today's healthcare provider: [name], MD   Chief Complaint  Patient presents with   Hypertension   Subjective    HPI  Hypertension, follow-up  BP Readings from Last 3 Encounters:  01/05/22 124/77  08/22/21 (!) 144/83  09/28/20 130/66   Wt Readings from Last 3 Encounters:  01/05/22 241 lb 14.4 oz (109.7 kg)  08/22/21 242 lb (109.8 kg)  08/08/21 227 lb (103 kg)     She was last seen for hypertension 4 months ago.  BP at that visit was 144/83. Management since that visit includes start losartan-hctz 50-12.5 mg daily. Advised to fu in 6 weeks.   She reports fair compliance with treatment. Patient stopped taking medication due to "BP has stabilized." Patient reports retaining fluid in hands/wrist, ankles and occasional feet. She reports swelling even with medication. Patient requested labs to be done before appt to go over results at office visit.  She was not having side effects.   Use of agents associated with hypertension: none.   Outside blood pressures are stable.  Pertinent labs Lab Results  Component Value Date   CHOL 272 (H) 12/16/2021   HDL 57 12/16/2021   LDLCALC 173 (H) 12/16/2021   TRIG 223 (H) 12/16/2021   CHOLHDL 4.8 (H) 12/16/2021   Lab Results  Component Value Date   NA 140 12/16/2021   K 5.0 12/16/2021   CREATININE 0.72 12/16/2021   EGFR 90 12/16/2021   GLUCOSE 104 (H) 12/16/2021   TSH 6.550 (H) 12/16/2021     The 10-year ASCVD risk score (Arnett DK, et al., 2019) is:  9.7%  --------------------------------------------------------------------------------------------------- Lipid/Cholesterol, Follow-up  Last lipid panel Other pertinent labs  Lab Results  Component Value Date   CHOL 272 (H) 12/16/2021   HDL 57 12/16/2021   LDLCALC 173 (H) 12/16/2021   TRIG 223 (H) 12/16/2021   CHOLHDL 4.8 (H) 12/16/2021   Lab Results  Component Value Date   ALT 26 12/16/2021   AST 30 12/16/2021   PLT 262 08/22/2021   TSH 6.550 (H) 12/16/2021     She was last seen for this 4 months ago.  Management since that visit includes no changes. Not currently on statin. Has tried and failed simvastatin and a few others in the past and gotten myalgias.  The 10-year ASCVD risk score (Arnett DK, et al., 2019) is: 9.7%  ---------------------------------------------------------------------------------------------------  Medications: Outpatient Medications Prior to Visit  Medication Sig   aspirin 81 MG EC tablet Take 81 mg by mouth daily. Swallow whole.   Azelaic Acid 15 % gel Apply to face 1-2 times a day for rosacea.   Biotin 5 MG TABS Take by mouth daily.   Cetirizine HCl 10 MG CAPS Take 1 capsule by mouth as needed. Reported on 06/30/2015   Cholecalciferol (VITAMIN D3) 1000 units CAPS Take by mouth daily.   doxycycline (PERIOSTAT) 20 MG tablet TAKE 1 TABLET BY MOUTH TWICE A DAY   econazole nitrate 1 % cream Apply topically 2 (two) times daily as needed. For 2-4 weeks or until rash cleared then PRN recurrence   ELDERBERRY PO Take by mouth.   hydrocortisone (ANUSOL-HC)  25 MG suppository Place 1 suppository (25 mg total) rectally 2 (two) times daily as needed for hemorrhoids or anal itching.   Misc Natural Products (ELDERBERRY ZINC/VIT C/IMMUNE MT) Take 200 mg by mouth daily.   MULTIPLE VITAMINS-MINERALS PO Take by mouth daily.   Vitamin D, Ergocalciferol, (DRISDOL) 1.25 MG (50000 UNIT) CAPS capsule TAKE 1 CAPSULE BY MOUTH EVERY 7 DAYS   [DISCONTINUED] levothyroxine  (SYNTHROID) 125 MCG tablet Take by mouth.   [DISCONTINUED] losartan-hydrochlorothiazide (HYZAAR) 50-12.5 MG tablet TAKE 1 TABLET BY MOUTH DAILY (Patient not taking: Reported on 01/05/2022)   [DISCONTINUED] promethazine-dextromethorphan (PROMETHAZINE-DM) 6.25-15 MG/5ML syrup Take by mouth.   No facility-administered medications prior to visit.    Review of Systems  Constitutional:  Negative for appetite change and fatigue.  Eyes:  Negative for visual disturbance.  Respiratory:  Positive for cough. Negative for shortness of breath and wheezing.   Cardiovascular:  Positive for leg swelling. Negative for chest pain.  Gastrointestinal:  Negative for abdominal pain, nausea and vomiting.  Neurological:  Negative for dizziness, light-headedness and headaches.    Last CBC Lab Results  Component Value Date   WBC 5.6 08/22/2021   HGB 13.8 08/22/2021   HCT 41.1 08/22/2021   MCV 84 08/22/2021   MCH 28.2 08/22/2021   RDW 13.2 08/22/2021   PLT 262 08/22/2021   Last metabolic panel Lab Results  Component Value Date   GLUCOSE 104 (H) 12/16/2021   NA 140 12/16/2021   K 5.0 12/16/2021   CL 101 12/16/2021   CO2 25 12/16/2021   BUN 20 12/16/2021   CREATININE 0.72 12/16/2021   EGFR 90 12/16/2021   CALCIUM 9.6 12/16/2021   PROT 6.6 12/16/2021   ALBUMIN 4.4 12/16/2021   LABGLOB 2.2 12/16/2021   AGRATIO 2.0 12/16/2021   BILITOT 0.4 12/16/2021   ALKPHOS 67 12/16/2021   AST 30 12/16/2021   ALT 26 12/16/2021   Last lipids Lab Results  Component Value Date   CHOL 272 (H) 12/16/2021   HDL 57 12/16/2021   LDLCALC 173 (H) 12/16/2021   TRIG 223 (H) 12/16/2021   CHOLHDL 4.8 (H) 12/16/2021   Last hemoglobin A1c Lab Results  Component Value Date   HGBA1C 6.4 (H) 12/16/2021   Last thyroid functions Lab Results  Component Value Date   TSH 6.550 (H) 12/16/2021   Last vitamin D Lab Results  Component Value Date   VD25OH 48.4 12/16/2021   Last vitamin B12 and Folate Lab Results   Component Value Date   VITAMINB12 762 09/28/2020   FOLATE 11.7 04/16/2020       Objective    BP 124/77 (BP Location: Left Arm, Cuff Size: Large)   Pulse 77   Temp 98.7 F (37.1 C) (Oral)   Resp 16   Wt 241 lb 14.4 oz (109.7 kg)   BMI 36.78 kg/m    Physical Exam Vitals reviewed.  Constitutional:      General: She is not in acute distress.    Appearance: Normal appearance. She is well-developed. She is not diaphoretic.  HENT:     Head: Normocephalic and atraumatic.  Eyes:     General: No scleral icterus.    Conjunctiva/sclera: Conjunctivae normal.  Neck:     Thyroid: No thyromegaly.  Cardiovascular:     Rate and Rhythm: Normal rate and regular rhythm.     Pulses: Normal pulses.     Heart sounds: Normal heart sounds. No murmur heard. Pulmonary:     Effort: Pulmonary effort is normal. No  respiratory distress.     Breath sounds: Normal breath sounds. No wheezing, rhonchi or rales.  Musculoskeletal:     Cervical back: Neck supple.     Right lower leg: No edema.     Left lower leg: No edema.     Comments: Nonpitting edema of bilateral hands and wrists right greater than left  Lymphadenopathy:     Cervical: No cervical adenopathy.  Skin:    General: Skin is warm and dry.     Findings: No rash.  Neurological:     Mental Status: She is alert and oriented to person, place, and time. Mental status is at baseline.  Psychiatric:        Mood and Affect: Mood normal.        Behavior: Behavior normal.       No results found for any visits on 01/05/22.  Assessment & Plan     Problem List Items Addressed This Visit       Cardiovascular and Mediastinum   Primary hypertension - Primary    Well controlled Continue diet control No longer on any medications Reviewed metabolic panel      Relevant Medications   rosuvastatin (CRESTOR) 5 MG tablet     Endocrine   Adult hypothyroidism    Chronic and uncontrolled last TSH 6.8 She is currently taking her Synthroid 125  mcg daily except for half dose on Sundays Encouraged her to take a full dose daily May be contributing to her edema Recheck TSH in 2 months      Relevant Medications   levothyroxine (SYNTHROID) 125 MCG tablet   Other Relevant Orders   TSH     Other   Hypercholesteremia    Elevated with high ascvd risk Previously statin intolerant due to myalgias, but never tried intermittent dosing Will try Crestor 5mg  weekly Add coq10 daily Repeat FLP and CMP in 6 m at next appt      Relevant Medications   rosuvastatin (CRESTOR) 5 MG tablet   Morbid obesity (HCC)    Discussed importance of healthy weight management Discussed diet and exercise  BMI 36 and assoc with prediabetes nad HLD      Prediabetes    Recommend low carb diet Recheck A1c at next visit        Return in about 5 months (around 06/06/2022) for chronic disease f/u.      I, [name], MD, have reviewed all documentation for this visit. The documentation on 01/05/22 for the exam, diagnosis, procedures, and orders are all accurate and complete.   Cam Dauphin, Marzella Schlein, MD, MPH [facility]

## 2022-01-05 ENCOUNTER — Ambulatory Visit: Payer: Medicare PPO | Admitting: Family Medicine

## 2022-01-05 ENCOUNTER — Encounter: Payer: Self-pay | Admitting: Family Medicine

## 2022-01-05 ENCOUNTER — Other Ambulatory Visit: Payer: Self-pay | Admitting: Dermatology

## 2022-01-05 VITALS — BP 124/77 | HR 77 | Temp 98.7°F | Resp 16 | Wt 241.9 lb

## 2022-01-05 DIAGNOSIS — E039 Hypothyroidism, unspecified: Secondary | ICD-10-CM

## 2022-01-05 DIAGNOSIS — R7303 Prediabetes: Secondary | ICD-10-CM | POA: Diagnosis not present

## 2022-01-05 DIAGNOSIS — E7800 Pure hypercholesterolemia, unspecified: Secondary | ICD-10-CM | POA: Diagnosis not present

## 2022-01-05 DIAGNOSIS — I10 Essential (primary) hypertension: Secondary | ICD-10-CM | POA: Diagnosis not present

## 2022-01-05 DIAGNOSIS — L719 Rosacea, unspecified: Secondary | ICD-10-CM

## 2022-01-05 MED ORDER — LEVOTHYROXINE SODIUM 125 MCG PO TABS: 125.0000 ug | ORAL_TABLET | Freq: Every day | ORAL | 3 refills | Status: DC

## 2022-01-05 MED ORDER — ROSUVASTATIN CALCIUM 5 MG PO TABS: 5.0000 mg | ORAL_TABLET | ORAL | 3 refills | Status: DC

## 2022-01-05 MED ORDER — PROMETHAZINE-DM 6.25-15 MG/5ML PO SYRP: 5.0000 mL | ORAL_SOLUTION | Freq: Four times a day (QID) | ORAL | 0 refills | Status: AC | PRN

## 2022-01-05 NOTE — Assessment & Plan Note (Signed)
Elevated with high ascvd risk Previously statin intolerant due to myalgias, but never tried intermittent dosing Will try Crestor '5mg'$  weekly Add coq10 daily Repeat FLP and CMP in 6 m at next appt

## 2022-01-05 NOTE — Assessment & Plan Note (Signed)
Discussed importance of healthy weight management Discussed diet and exercise  BMI 36 and assoc with prediabetes nad HLD

## 2022-01-05 NOTE — Assessment & Plan Note (Signed)
Chronic and uncontrolled last TSH 6.8 She is currently taking her Synthroid 125 mcg daily except for half dose on Sundays Encouraged her to take a full dose daily May be contributing to her edema Recheck TSH in 2 months

## 2022-01-05 NOTE — Assessment & Plan Note (Addendum)
Well controlled Continue diet control No longer on any medications Reviewed metabolic panel

## 2022-01-05 NOTE — Assessment & Plan Note (Signed)
Recommend low carb diet Recheck A1c at next visit 

## 2022-03-16 ENCOUNTER — Ambulatory Visit: Payer: Medicare PPO | Admitting: Family Medicine

## 2022-03-16 ENCOUNTER — Encounter: Payer: Self-pay | Admitting: Family Medicine

## 2022-03-16 VITALS — BP 139/71 | HR 97 | Temp 98.5°F | Resp 20 | Wt 241.4 lb

## 2022-03-16 DIAGNOSIS — U071 COVID-19: Secondary | ICD-10-CM | POA: Diagnosis not present

## 2022-03-16 DIAGNOSIS — J069 Acute upper respiratory infection, unspecified: Secondary | ICD-10-CM

## 2022-03-16 LAB — POCT INFLUENZA A/B
Influenza A, POC: NEGATIVE
Influenza B, POC: NEGATIVE

## 2022-03-16 LAB — POC COVID19 BINAXNOW: SARS Coronavirus 2 Ag: POSITIVE — AB

## 2022-03-16 MED ORDER — NIRMATRELVIR/RITONAVIR (PAXLOVID)TABLET: 3.0000 | ORAL_TABLET | Freq: Two times a day (BID) | ORAL | 0 refills | Status: AC

## 2022-03-16 NOTE — Progress Notes (Signed)
I,Sulibeya S Dimas,acting as a Education administrator for [name], MD.,have documented all relevant documentation on the behalf of [name], MD,as directed by  [name], MD while in the presence of [name], MD.     Established patient visit   Patient: Erica Pineda   DOB: Sep 26, 1950   72 y.o. Female  MRN: [id number] Visit Date: 03/16/2022  Today's healthcare provider: [name], MD   Chief Complaint  Patient presents with   URI   Subjective    HPI  Upper respiratory symptoms She complains of achiness, congestion, low grade fever, productive cough with  clear colored sputum, purulent nasal discharge, sinus pressure, sneezing, and sore throat.with chills. Onset of symptoms was  2 days ago and gradually worsening.She is drinking plenty of fluids.  Past history is significant for no history of pneumonia or bronchitis. Patient is non-smoker  ----------------------------------------------------------------------------------------   Medications: Outpatient Medications Prior to Visit  Medication Sig   aspirin 81 MG EC tablet Take 81 mg by mouth daily. Swallow whole.   Azelaic Acid 15 % gel Apply to face 1-2 times a day for rosacea.   Biotin 5 MG TABS Take by mouth daily.   Cetirizine HCl 10 MG CAPS Take 1 capsule by mouth as needed. Reported on 06/30/2015   Cholecalciferol (VITAMIN D3) 1000 units CAPS Take by mouth daily.   Coenzyme Q10 (COQ10 PO) Take by mouth.   doxycycline (PERIOSTAT) 20 MG tablet TAKE 1 TABLET BY MOUTH TWICE A DAY   econazole nitrate 1 % cream Apply topically 2 (two) times daily as needed. For 2-4 weeks or until rash cleared then PRN recurrence   ELDERBERRY PO Take by mouth.   hydrocortisone (ANUSOL-HC) 25 MG suppository Place 1 suppository (25 mg total) rectally 2 (two) times daily as needed for hemorrhoids or anal itching.   levothyroxine (SYNTHROID) 125 MCG tablet Take 1 tablet (125 mcg total) by mouth daily.   Misc Natural Products  (ELDERBERRY ZINC/VIT C/IMMUNE MT) Take 200 mg by mouth daily.   MULTIPLE VITAMINS-MINERALS PO Take by mouth daily.   rosuvastatin (CRESTOR) 5 MG tablet Take 1 tablet (5 mg total) by mouth once a week.   Vitamin D, Ergocalciferol, (DRISDOL) 1.25 MG (50000 UNIT) CAPS capsule TAKE 1 CAPSULE BY MOUTH EVERY 7 DAYS   No facility-administered medications prior to visit.    Review of Systems  Constitutional:  Positive for chills and fever.  HENT:  Positive for congestion, postnasal drip, rhinorrhea, sinus pressure, sinus pain, sneezing and sore throat. Negative for ear pain and trouble swallowing.   Respiratory:  Positive for cough. Negative for shortness of breath and wheezing.   Gastrointestinal:  Negative for abdominal pain, nausea and vomiting.  Musculoskeletal:  Positive for myalgias.       Objective    BP 139/71 (BP Location: Left Arm, Patient Position: Sitting, Cuff Size: Large)   Pulse 97   Temp 98.5 F (36.9 C) (Temporal)   Resp 20   Wt 241 lb 6.4 oz (109.5 kg)   SpO2 99%   BMI 36.70 kg/m    Physical Exam Vitals reviewed.  Constitutional:      General: She is not in acute distress.    Appearance: Normal appearance. She is well-developed. She is not diaphoretic.  HENT:     Head: Normocephalic and atraumatic.     Right Ear: Tympanic membrane, ear canal and external ear normal.     Left Ear: Tympanic membrane, ear canal and external ear normal.  Nose: Nose normal.     Mouth/Throat:     Mouth: Mucous membranes are moist.     Pharynx: Oropharynx is clear. No oropharyngeal exudate.  Eyes:     General: No scleral icterus.    Conjunctiva/sclera: Conjunctivae normal.     Pupils: Pupils are equal, round, and reactive to light.  Neck:     Thyroid: No thyromegaly.  Cardiovascular:     Rate and Rhythm: Normal rate and regular rhythm.     Pulses: Normal pulses.     Heart sounds: Normal heart sounds. No murmur heard. Pulmonary:     Effort: Pulmonary effort is normal. No  respiratory distress.     Breath sounds: Normal breath sounds. No wheezing or rales.  Musculoskeletal:     Cervical back: Neck supple.     Right lower leg: No edema.     Left lower leg: No edema.  Lymphadenopathy:     Cervical: No cervical adenopathy.  Skin:    General: Skin is warm and dry.  Neurological:     Mental Status: She is alert.       No results found for any visits on 03/16/22.  Assessment & Plan     1. COVID-19 2. Viral URI with cough - symptoms and exam c/w viral URI - no evidence of strep pharyngitis, CAP, AOM, bacterial sinusitis, or other bacterial infection -  positive for COVID19 - discussed natural course, symptomatic management, and return precautions - Good candidate for Paxlovid - Rx'd today - hold statin while on paxlovid - discussed isolation precautions - POC COVID-19 - POCT Influenza A/B   Meds ordered this encounter  Medications   nirmatrelvir/ritonavir (PAXLOVID) 20 x 150 MG & 10 x 100MG TABS    Sig: Take 3 tablets by mouth 2 (two) times daily for 5 days. (Take nirmatrelvir 150 mg two tablets twice daily for 5 days and ritonavir 100 mg one tablet twice daily for 5 days) Patient GFR is 60    Dispense:  30 tablet    Refill:  0     Return if symptoms worsen or fail to improve.      I, [name], MD, have reviewed all documentation for this visit. The documentation on 03/16/22 for the exam, diagnosis, procedures, and orders are all accurate and complete.   Aideliz Garmany, Dionne Bucy, MD, MPH [facility]

## 2022-03-21 ENCOUNTER — Other Ambulatory Visit: Payer: Self-pay | Admitting: Family Medicine

## 2022-04-06 DIAGNOSIS — Z9889 Other specified postprocedural states: Secondary | ICD-10-CM | POA: Insufficient documentation

## 2022-04-27 ENCOUNTER — Ambulatory Visit: Payer: Medicare PPO | Admitting: Family Medicine

## 2022-04-29 LAB — TSH: TSH: 0.713 u[IU]/mL (ref 0.450–4.500)

## 2022-05-01 ENCOUNTER — Telehealth: Payer: Self-pay

## 2022-05-01 NOTE — Telephone Encounter (Signed)
Left detailed message on vm as per DPR with normal/stable lab results.

## 2022-05-01 NOTE — Progress Notes (Signed)
   I,Nykiah Ma S Eusebia Grulke,acting as a Education administrator for [name], MD.,have documented all relevant documentation on the behalf of [name], MD,as directed by  [name], MD while in the presence of [name], MD.     Established patient visit   Patient: Erica Pineda   DOB: 08-16-1950   72 y.o. Female  MRN: [id number] Visit Date: 05/02/2022  Today's healthcare provider: [name], MD   No chief complaint on file.  Subjective    HPI  ***  Medications: Outpatient Medications Prior to Visit  Medication Sig   aspirin 81 MG EC tablet Take 81 mg by mouth daily. Swallow whole.   Azelaic Acid 15 % gel Apply to face 1-2 times a day for rosacea.   Biotin 5 MG TABS Take by mouth daily.   Cetirizine HCl 10 MG CAPS Take 1 capsule by mouth as needed. Reported on 06/30/2015   Cholecalciferol (VITAMIN D3) 1000 units CAPS Take by mouth daily.   Coenzyme Q10 (COQ10 PO) Take by mouth.   doxycycline (PERIOSTAT) 20 MG tablet TAKE 1 TABLET BY MOUTH TWICE A DAY   econazole nitrate 1 % cream Apply topically 2 (two) times daily as needed. For 2-4 weeks or until rash cleared then PRN recurrence   ELDERBERRY PO Take by mouth.   hydrocortisone (ANUSOL-HC) 25 MG suppository Place 1 suppository (25 mg total) rectally 2 (two) times daily as needed for hemorrhoids or anal itching.   levothyroxine (SYNTHROID) 125 MCG tablet Take 1 tablet (125 mcg total) by mouth daily.   Misc Natural Products (ELDERBERRY ZINC/VIT C/IMMUNE MT) Take 200 mg by mouth daily.   MULTIPLE VITAMINS-MINERALS PO Take by mouth daily.   rosuvastatin (CRESTOR) 5 MG tablet TAKE 1 TABLET BY MOUTH ONCE WEEKLY   Vitamin D, Ergocalciferol, (DRISDOL) 1.25 MG (50000 UNIT) CAPS capsule TAKE 1 CAPSULE BY MOUTH EVERY 7 DAYS   No facility-administered medications prior to visit.    Review of Systems  {Labs  Heme  Chem  Endocrine  Serology  Results Review (optional):23779}   Objective    There were no vitals taken for  this visit. {Show previous vital signs (optional):23777}  Physical Exam  ***  No results found for any visits on 05/02/22.  Assessment & Plan     ***  No follow-ups on file.      {provider attestation***:1}   [name], MD  [facility] [phone] (phone) [phone] (fax)  [place]

## 2022-05-02 ENCOUNTER — Encounter: Payer: Self-pay | Admitting: Family Medicine

## 2022-05-02 ENCOUNTER — Ambulatory Visit: Payer: Medicare PPO | Admitting: Family Medicine

## 2022-05-02 VITALS — BP 128/79 | HR 76 | Temp 98.0°F | Resp 12 | Wt 241.8 lb

## 2022-05-02 DIAGNOSIS — I10 Essential (primary) hypertension: Secondary | ICD-10-CM | POA: Diagnosis not present

## 2022-05-02 DIAGNOSIS — G4700 Insomnia, unspecified: Secondary | ICD-10-CM

## 2022-05-02 DIAGNOSIS — M25512 Pain in left shoulder: Secondary | ICD-10-CM | POA: Diagnosis not present

## 2022-05-02 DIAGNOSIS — G8929 Other chronic pain: Secondary | ICD-10-CM

## 2022-05-02 DIAGNOSIS — E039 Hypothyroidism, unspecified: Secondary | ICD-10-CM

## 2022-05-02 MED ORDER — TRAZODONE HCL 50 MG PO TABS: 25.0000 mg | ORAL_TABLET | Freq: Every evening | ORAL | 3 refills | Status: DC | PRN

## 2022-05-02 NOTE — Assessment & Plan Note (Addendum)
Long standing  Rotator cuff strength intact which suggests no tears Trial of PT Consider SM referral if not improving

## 2022-05-02 NOTE — Assessment & Plan Note (Signed)
Chronic and uncontrolled Failed melatonin Trial of trazodone low dose prn

## 2022-05-02 NOTE — Assessment & Plan Note (Signed)
Well controlled Continue diet/lifestyle control - not on medications Reviewed last metabolic panel

## 2022-05-02 NOTE — Assessment & Plan Note (Signed)
Previously uncontrolled, medicaiton changed, and well controlled on repeat TSH (reviewed today) Continue Synthroid at current dose

## 2022-05-08 ENCOUNTER — Ambulatory Visit: Payer: Medicare PPO | Admitting: Family Medicine

## 2022-06-02 ENCOUNTER — Ambulatory Visit: Payer: Medicare PPO | Attending: Family Medicine

## 2022-06-02 DIAGNOSIS — G8929 Other chronic pain: Secondary | ICD-10-CM | POA: Insufficient documentation

## 2022-06-02 DIAGNOSIS — M25612 Stiffness of left shoulder, not elsewhere classified: Secondary | ICD-10-CM | POA: Insufficient documentation

## 2022-06-02 DIAGNOSIS — M25512 Pain in left shoulder: Secondary | ICD-10-CM | POA: Insufficient documentation

## 2022-06-02 NOTE — Therapy (Addendum)
OUTPATIENT PHYSICAL THERAPY SHOULDER EVALUATION   Patient Name: Erica Pineda MRN: [id number] DOB:04-Apr-1950, 72 y.o., female Today's Date: 06/02/2022  END OF SESSION:  PT End of Session - 06/02/22 1056     Visit Number 1    Number of Visits 17    Date for PT Re-Evaluation 08/25/22    Authorization Type [place]    Authorization - Visit Number 1    Authorization - Number of Visits 17    Progress Note Due on Visit 10    PT Start Time 1049    PT Stop Time 1149    PT Time Calculation (min) 60 min    Activity Tolerance Patient tolerated treatment well    Behavior During Therapy WFL for tasks assessed/performed             Past Medical History:  Diagnosis Date   Cancer (HCC)    Thyroid-follwed by Endocrine   Hyperlipidemia    Past Surgical History:  Procedure Laterality Date   ABDOMINAL HYSTERECTOMY     APPENDECTOMY  1996   BUNIONECTOMY  05/2007   and hammer-toe   CATARACT EXTRACTION Bilateral    CYSTOCELE REPAIR  1989   HERNIA REPAIR Left 1993   HYMENECTOMY  1971   TONSILLECTOMY AND ADENOIDECTOMY  1972   TOTAL THYROIDECTOMY  02/08/2009   Patient Active Problem List   Diagnosis Date Noted   Insomnia 05/02/2022   Chronic left shoulder pain 05/02/2022   Primary hypertension 08/23/2021   Morbid obesity (HCC) 08/22/2021   Prediabetes 08/22/2021   Stress at home 08/22/2021   Family history of breast cancer 08/10/2016   Adult hypothyroidism 06/11/2014   Avitaminosis D 06/11/2014   Hypercholesteremia 05/07/2006    PCP: Erasmo Downer, MD  REFERRING PROVIDER: Erasmo Downer, MD  REFERRING DIAG: [phone] (ICD-10-CM) - Chronic left shoulder pain   THERAPY DIAG:  Chronic left shoulder pain - Plan: PT plan of care cert/re-cert  Decreased ROM of left shoulder - Plan: PT plan of care cert/re-cert  Rationale for Evaluation and Treatment: Rehabilitation  ONSET DATE: Several years  SUBJECTIVE:                                                                                                                                                                                       SUBJECTIVE STATEMENT:  Pt reports that she has been having some complications in the L shoulder over recent years.  Pt reports that she is having difficulty with IR of the L shoulder, specifically with fastening her bra.  Pt notes that she is getting massages on a regular and she notes that a lot of the stress is loated  in bilateral pec region.  Pt notes that she has some difficulty with sleeping on the L shoulder as she is a side sleeper and stomach sleeper.  Pt notes that she is hoping to avoid surgery.  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  Hand dominance: Right  PERTINENT HISTORY: Pt has PMH including: HTN, Hypothyroidism, Insomnia, Chronic L Shoulder Pain,  PAIN: Are you having pain? Yes: NPRS scale: 4/10 Pain location: L pectoral region Pain description: sharp Aggravating factors: Movement into ER/Total scaption, IR Relieving factors: Getting out of the position  PRECAUTIONS: None  WEIGHT BEARING RESTRICTIONS: No  FALLS: Has patient fallen in last 6 months? 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  UPPER EXTREMITY ROM:   Active ROM Right eval Left eval  Shoulder flexion 175 deg 156 deg  Shoulder extension    Shoulder abduction 182 deg 179 deg*  Shoulder  adduction    Shoulder internal rotation    Shoulder external rotation    Elbow flexion    Elbow extension    Wrist flexion    Wrist extension    Wrist ulnar deviation    Wrist radial deviation    Wrist pronation    Wrist supination    (Blank rows = not tested)  UPPER EXTREMITY MMT:  MMT Right eval Left eval  Shoulder flexion 4+ 4+  Shoulder extension    Shoulder abduction 4+ 3+  Shoulder adduction    Shoulder internal rotation    Shoulder external rotation    Middle trapezius    Lower trapezius    Elbow flexion    Elbow extension    Wrist flexion    Wrist extension    Wrist ulnar deviation    Wrist radial deviation    Wrist pronation    Wrist supination    Grip strength (lbs)    (Blank rows = not tested)  SHOULDER SPECIAL TESTS: Impingement tests: Neer impingement test: positive , Hawkins/Kennedy impingement test: negative, and Painful arc test: negative Rotator cuff assessment: Empty can test: positive , Full can test: positive , and Belly press test: negative Biceps assessment: Yergason's test: negative and Speed's test: positive   JOINT MOBILITY TESTING:  Pt has adequate ROM, with slight limitation in the L shoulder with abduction when comparing to the R.  Pt however does seem to have some joint restrictions in the L shoulder when performing joint mobilization.  Pt has hypomobility with AP glide and even more when doing inferior glides.`  PALPATION:  Pt has TP's noted in L pec, L supraspinatus, L infraspinatus, L subscapular region, and L biceps   TODAY'S TREATMENT: DATE: 06/02/22  Pt given HEP and demonstrated all exercises with use of green physioball as noted below     PATIENT EDUCATION: Education details: Pt educated throughout session about proper posture and technique with exercises. Improved exercise technique, movement at target joints, use of target muscles after  min to mod verbal, visual, tactile cues Person educated: Patient Education method:  Explanation, Demonstration, Tactile cues, Verbal cues, and Handouts Education comprehension: verbalized understanding, returned demonstration, verbal cues required, and tactile cues required  HOME EXERCISE PROGRAM:  Access Code: [email or web address] URL: [email or web address] Date: 06/02/2022 Prepared by: Tomasa Hose  Exercises - Isometric Shoulder Abduction with [employer] at Wall  - 1 x daily - 7 x weekly - 3 sets - 10 reps - Isometric Shoulder Flexion with Ball at Wall  - 1 x daily - 7 x weekly - 3 sets - 10 reps - Isometric Shoulder Extension with Ball at [employer]  - 1 x daily - 7 x weekly - 3 sets - 10 reps - Isometric Shoulder Adduction  - 1 x daily - 7 x weekly - 3 sets - 10 reps - Isometric Shoulder External Rotation with Ball at Wall  - 1 x daily - 7 x weekly - 3 sets - 10 reps - Standing Isometric Shoulder Internal Rotation at Wall with Ball  - 1 x daily - 7 x weekly - 3 sets - 10 reps  ASSESSMENT:  CLINICAL IMPRESSION: Patient is a 72 y.o. female who was seen today for physical therapy evaluation and treatment for chronic L shoulder pain.  Pt presents with physical impairments of decreased activity tolerance, decreased ROM of L shoulder abduction, increased pain in L shoulder, and decreased strength in L shoulder as noted.  Pt will benefit from skilled therapy to address tolerance, ROM, pain, and strength impairments necessary for improvement in quality of life.  Pt. demonstrates understanding of this plan of care and agrees with this plan.    OBJECTIVE IMPAIRMENTS: decreased activity tolerance, decreased ROM, decreased strength, hypomobility, obesity, and pain.   ACTIVITY LIMITATIONS: lifting, sleeping, and dressing  PARTICIPATION LIMITATIONS: occupation and church  PERSONAL FACTORS: Age, Fitness, Past/current experiences, Time since onset of injury/illness/exacerbation, and 1-2 comorbidities: HTN, insomnia  are also affecting patient's functional outcome.   REHAB POTENTIAL:  Good  CLINICAL DECISION MAKING: Stable/uncomplicated  EVALUATION COMPLEXITY: Low   GOALS: Goals reviewed with patient? Yes  SHORT TERM GOALS: Target date: 06/30/2022  Pt will be independent with HEP in order to demonstrate increased ability to perform tasks related to occupation/hobbies. Baseline: Pt given HEP at initial evaluation for isometrics Goal status: INITIAL   LONG TERM GOALS: Target date: 08/25/2022  Patient will demonstrate improved function as evidenced by a score of 77 on FOTO measure for full participation in activities at home and in the community. Baseline: 74 Goal status: INITIAL  2.  Patient will demonstrate adequate shoulder ROM and strength to be able to don bra and dress independently with pain less than 3/10. Baseline: Pt reports 4/10 pain with IR Goal status: INITIAL  3.  Patient will increase L UE gross strength to 5/5 as to improve functional strength for activity tolerance needed for piano playing and increased ADL ability. Baseline: Pt demonstrates 4/5 gross strength and noted weakness with piano playing. Goal status: INITIAL  PLAN:  PT FREQUENCY: 2x/week  PT DURATION: 12 weeks  PLANNED INTERVENTIONS: Therapeutic exercises, Therapeutic activity, Neuromuscular re-education, Balance training, Gait training, Patient/Family education, Self Care, Joint mobilization, Joint manipulation, Vestibular training, Canalith repositioning, Aquatic Therapy, Dry Needling, Spinal manipulation, Spinal mobilization, Cryotherapy, Moist heat, Taping, Ultrasound, Manual therapy, and Re-evaluation  PLAN FOR NEXT SESSION: Assess HEP compliance and perform manual therapy for increased joint mobility and reduction of TP's.   Nolon Bussing, PT, DPT Physical Therapist - [facility]  06/02/22, 12:14 PM

## 2022-06-06 ENCOUNTER — Ambulatory Visit: Payer: Medicare PPO

## 2022-06-06 ENCOUNTER — Encounter: Payer: Self-pay | Admitting: Family Medicine

## 2022-06-06 DIAGNOSIS — I10 Essential (primary) hypertension: Secondary | ICD-10-CM

## 2022-06-06 DIAGNOSIS — E7800 Pure hypercholesterolemia, unspecified: Secondary | ICD-10-CM

## 2022-06-06 DIAGNOSIS — R7303 Prediabetes: Secondary | ICD-10-CM

## 2022-06-08 ENCOUNTER — Ambulatory Visit: Payer: Medicare PPO

## 2022-06-08 ENCOUNTER — Ambulatory Visit: Payer: Medicare PPO | Admitting: Family Medicine

## 2022-06-12 ENCOUNTER — Ambulatory Visit: Payer: Medicare PPO | Admitting: Family Medicine

## 2022-06-13 ENCOUNTER — Encounter: Payer: Self-pay | Admitting: Family Medicine

## 2022-06-13 ENCOUNTER — Ambulatory Visit: Payer: Medicare PPO | Admitting: Family Medicine

## 2022-06-13 VITALS — BP 123/79 | HR 75 | Temp 98.4°F | Resp 12 | Ht 68.0 in | Wt 239.1 lb

## 2022-06-13 DIAGNOSIS — E039 Hypothyroidism, unspecified: Secondary | ICD-10-CM

## 2022-06-13 DIAGNOSIS — E785 Hyperlipidemia, unspecified: Secondary | ICD-10-CM

## 2022-06-13 DIAGNOSIS — R7989 Other specified abnormal findings of blood chemistry: Secondary | ICD-10-CM

## 2022-06-13 DIAGNOSIS — E1169 Type 2 diabetes mellitus with other specified complication: Secondary | ICD-10-CM

## 2022-06-13 DIAGNOSIS — E1159 Type 2 diabetes mellitus with other circulatory complications: Secondary | ICD-10-CM | POA: Diagnosis not present

## 2022-06-13 DIAGNOSIS — I152 Hypertension secondary to endocrine disorders: Secondary | ICD-10-CM

## 2022-06-13 LAB — COMPREHENSIVE METABOLIC PANEL
ALT: 38 IU/L — ABNORMAL HIGH (ref 0–32)
AST: 43 IU/L — ABNORMAL HIGH (ref 0–40)
Albumin/Globulin Ratio: 1.9 (ref 1.2–2.2)
Albumin: 4.5 g/dL (ref 3.8–4.8)
Alkaline Phosphatase: 83 IU/L (ref 44–121)
BUN/Creatinine Ratio: 17 (ref 12–28)
BUN: 14 mg/dL (ref 8–27)
Bilirubin Total: 0.5 mg/dL (ref 0.0–1.2)
CO2: 21 mmol/L (ref 20–29)
Calcium: 9.9 mg/dL (ref 8.7–10.3)
Chloride: 102 mmol/L (ref 96–106)
Creatinine, Ser: 0.81 mg/dL (ref 0.57–1.00)
Globulin, Total: 2.4 g/dL (ref 1.5–4.5)
Glucose: 109 mg/dL — ABNORMAL HIGH (ref 70–99)
Potassium: 5 mmol/L (ref 3.5–5.2)
Sodium: 141 mmol/L (ref 134–144)
Total Protein: 6.9 g/dL (ref 6.0–8.5)
eGFR: 78 mL/min/{1.73_m2} (ref >59)

## 2022-06-13 LAB — LIPID PANEL
Chol/HDL Ratio: 4.7 ratio — ABNORMAL HIGH (ref 0.0–4.4)
Cholesterol, Total: 232 mg/dL — ABNORMAL HIGH (ref 100–199)
HDL: 49 mg/dL (ref >39)
LDL Chol Calc (NIH): 152 mg/dL — ABNORMAL HIGH (ref 0–99)
Triglycerides: 173 mg/dL — ABNORMAL HIGH (ref 0–149)
VLDL Cholesterol Cal: 31 mg/dL (ref 5–40)

## 2022-06-13 LAB — HEMOGLOBIN A1C
Est. average glucose Bld gHb Est-mCnc: 143 mg/dL
Hgb A1c MFr Bld: 6.6 % — ABNORMAL HIGH (ref 4.8–5.6)

## 2022-06-13 MED ORDER — ROSUVASTATIN CALCIUM 5 MG PO TABS: 5.0000 mg | ORAL_TABLET | ORAL | 3 refills | Status: DC

## 2022-06-13 NOTE — Assessment & Plan Note (Signed)
Elevated with high ascvd risk, but improving since last reading Previously statin intolerant due to myalgias, but never tried intermittent dosing Tolerating Crestor 5mg  weekly, so will increase to twice weekly Add coq10 daily Repeat FLP and CMP in 3 m at next appt Goal LDL less than 70

## 2022-06-13 NOTE — Assessment & Plan Note (Addendum)
New diagnosis Has been prediabetic for years A1c is at goal of <7 Would like to try lifestyle changes and no meds at this time Discussed diet and exercise Discussed screenings Foot exam and UACR today Upcoming eye exam Follow-up in 3 months repeat A1c

## 2022-06-13 NOTE — Assessment & Plan Note (Signed)
Well controlled Continue current medications Recheck metabolic panel F/u in 3 months  

## 2022-06-13 NOTE — Assessment & Plan Note (Signed)
Discussed importance of healthy weight management Discussed diet and exercise  BMI 36 and assoc with T2DM, HLD, HTN

## 2022-06-13 NOTE — Progress Notes (Signed)
I,Sulibeya S Dimas,acting as a Neurosurgeon for Erica Latch, MD.,have documented all relevant documentation on the behalf of Erica Latch, MD,as directed by  Erica Latch, MD while in the presence of Erica Latch, MD.     Established patient visit   Patient: Erica Pineda   DOB: 03-31-1950   72 y.o. Female  MRN: [id number] Visit Date: 06/13/2022  Today's healthcare provider: [name], MD   Chief Complaint  Patient presents with   Diabetes   Subjective    Diabetes She presents for her initial diabetic visit. She has type 2 diabetes mellitus. Her disease course has been stable. Pertinent negatives for hypoglycemia include no dizziness or headaches. Pertinent negatives for diabetes include no chest pain and no fatigue. Symptoms are stable.    Hypertension, follow-up  BP Readings from Last 3 Encounters:  06/13/22 123/79  05/02/22 128/79  03/16/22 139/71   Wt Readings from Last 3 Encounters:  06/13/22 239 lb 1.6 oz (108.5 kg)  05/02/22 241 lb 12.8 oz (109.7 kg)  03/16/22 241 lb 6.4 oz (109.5 kg)     She was last seen for hypertension 1 months ago.  BP at that visit was 128/79. Management since that visit includes no changes.  She reports excellent compliance with treatment. She is not having side effects.   Outside blood pressures are stable.  Pertinent labs Lab Results  Component Value Date   CHOL 232 (H) 06/12/2022   HDL 49 06/12/2022   LDLCALC 152 (H) 06/12/2022   TRIG 173 (H) 06/12/2022   CHOLHDL 4.7 (H) 06/12/2022   Lab Results  Component Value Date   NA 141 06/12/2022   K 5.0 06/12/2022   CREATININE 0.81 06/12/2022   EGFR 78 06/12/2022   GLUCOSE 109 (H) 06/12/2022   TSH 0.713 04/28/2022     The 10-year ASCVD risk score (Arnett DK, et al., 2019) is: 19.1% --------------------------------------------------------------------------------------------------- Lipid/Cholesterol, Follow-up  Last lipid panel Other pertinent labs  Lab Results   Component Value Date   CHOL 232 (H) 06/12/2022   HDL 49 06/12/2022   LDLCALC 152 (H) 06/12/2022   TRIG 173 (H) 06/12/2022   CHOLHDL 4.7 (H) 06/12/2022   Lab Results  Component Value Date   ALT 38 (H) 06/12/2022   AST 43 (H) 06/12/2022   PLT 262 08/22/2021   TSH 0.713 04/28/2022     She was last seen for this 5 months ago.  Management since that visit includes start Crestor 5 mg daily.  She reports excellent compliance with treatment. She is not having side effects.   The 10-year ASCVD risk score (Arnett DK, et al., 2019) is: 19.1%  ---------------------------------------------------------------------------------------------------   Medications: Outpatient Medications Prior to Visit  Medication Sig   aspirin 81 MG EC tablet Take 81 mg by mouth daily. Swallow whole.   Azelaic Acid 15 % gel Apply to face 1-2 times a day for rosacea.   Biotin 5 MG TABS Take by mouth daily.   Cetirizine HCl 10 MG CAPS Take 1 capsule by mouth as needed. Reported on 06/30/2015   Cholecalciferol (VITAMIN D3) 1000 units CAPS Take by mouth daily.   Coenzyme Q10 (COQ10 PO) Take by mouth.   econazole nitrate 1 % cream Apply topically 2 (two) times daily as needed. For 2-4 weeks or until rash cleared then PRN recurrence   ELDERBERRY PO Take by mouth.   hydrocortisone (ANUSOL-HC) 25 MG suppository Place 1 suppository (25 mg total) rectally 2 (two) times daily as needed for hemorrhoids or anal itching.  levothyroxine (SYNTHROID) 125 MCG tablet Take 1 tablet (125 mcg total) by mouth daily.   Misc Natural Products (ELDERBERRY ZINC/VIT C/IMMUNE MT) Take 200 mg by mouth daily.   MULTIPLE VITAMINS-MINERALS PO Take by mouth daily.   traZODone (DESYREL) 50 MG tablet Take 0.5-1 tablets (25-50 mg total) by mouth at bedtime as needed for sleep.   Vitamin D, Ergocalciferol, (DRISDOL) 1.25 MG (50000 UNIT) CAPS capsule TAKE 1 CAPSULE BY MOUTH EVERY 7 DAYS   [DISCONTINUED] rosuvastatin (CRESTOR) 5 MG tablet TAKE 1  TABLET BY MOUTH ONCE WEEKLY   [DISCONTINUED] doxycycline (PERIOSTAT) 20 MG tablet TAKE 1 TABLET BY MOUTH TWICE A DAY (Patient not taking: Reported on 06/13/2022)   No facility-administered medications prior to visit.    Review of Systems  Constitutional:  Negative for appetite change and fatigue.  Eyes:  Positive for itching. Negative for visual disturbance.  Respiratory:  Negative for chest tightness and shortness of breath.   Cardiovascular:  Positive for leg swelling. Negative for chest pain and palpitations.  Gastrointestinal:  Negative for abdominal pain, nausea and vomiting.  Endocrine: Negative for cold intolerance and heat intolerance.  Neurological:  Negative for dizziness, light-headedness and headaches.       Objective    BP 123/79 (BP Location: Left Arm, Patient Position: Sitting, Cuff Size: Large)   Pulse 75   Temp 98.4 F (36.9 C) (Temporal)   Resp 12   Ht 5\' 8"  (1.727 m)   Wt 239 lb 1.6 oz (108.5 kg)   BMI 36.36 kg/m    Physical Exam Vitals reviewed.  Constitutional:      General: She is not in acute distress.    Appearance: Normal appearance. She is well-developed. She is not diaphoretic.  HENT:     Head: Normocephalic and atraumatic.  Eyes:     General: No scleral icterus.    Conjunctiva/sclera: Conjunctivae normal.  Neck:     Thyroid: No thyromegaly.  Cardiovascular:     Rate and Rhythm: Normal rate and regular rhythm.     Pulses: Normal pulses.     Heart sounds: Normal heart sounds. No murmur heard. Pulmonary:     Effort: Pulmonary effort is normal. No respiratory distress.     Breath sounds: Normal breath sounds. No wheezing, rhonchi or rales.  Musculoskeletal:     Cervical back: Neck supple.     Right lower leg: No edema.     Left lower leg: No edema.  Lymphadenopathy:     Cervical: No cervical adenopathy.  Skin:    General: Skin is warm and dry.     Findings: No rash.  Neurological:     Mental Status: She is alert and oriented to person,  place, and time. Mental status is at baseline.  Psychiatric:        Mood and Affect: Mood normal.        Behavior: Behavior normal.       No results found for any visits on 06/13/22.  Assessment & Plan     Problem List Items Addressed This Visit       Cardiovascular and Mediastinum   Hypertension associated with diabetes (HCC)    Well controlled Continue current medications Recheck metabolic panel F/u in 3 months       Relevant Medications   rosuvastatin (CRESTOR) 5 MG tablet (Start on 06/15/2022)   Other Relevant Orders   Comprehensive metabolic panel   Lipid panel     Endocrine   Hyperlipidemia associated with type 2 diabetes mellitus (  HCC)    Elevated with high ascvd risk, but improving since last reading Previously statin intolerant due to myalgias, but never tried intermittent dosing Tolerating Crestor 5mg  weekly, so will increase to twice weekly Add coq10 daily Repeat FLP and CMP in 3 m at next appt Goal LDL less than 70      Relevant Medications   rosuvastatin (CRESTOR) 5 MG tablet (Start on 06/15/2022)   Other Relevant Orders   Comprehensive metabolic panel   Lipid panel   Adult hypothyroidism    Well-controlled on last TSH-reviewed today Continue Synthroid at current dose      T2DM (type 2 diabetes mellitus) (HCC) - Primary    New diagnosis Has been prediabetic for years A1c is at goal of <7 Would like to try lifestyle changes and no meds at this time Discussed diet and exercise Discussed screenings Foot exam and UACR today Upcoming eye exam Follow-up in 3 months repeat A1c      Relevant Medications   rosuvastatin (CRESTOR) 5 MG tablet (Start on 06/15/2022)   Other Relevant Orders   Comprehensive metabolic panel   Lipid panel   Hemoglobin A1c   Urine Microalbumin w/creat. ratio     Other   Morbid obesity (HCC)    Discussed importance of healthy weight management Discussed diet and exercise  BMI 36 and assoc with T2DM, HLD, HTN       Other Visit Diagnoses     LFT elevation       Relevant Orders   Hepatic function panel        Return in about 3 months (around 09/13/2022) for chronic disease f/u.      I, Erica Latch, MD, have reviewed all documentation for this visit. The documentation on 06/13/22 for the exam, diagnosis, procedures, and orders are all accurate and complete.   Ellarae Nevitt, Marzella Schlein, MD, MPH [facility]

## 2022-06-13 NOTE — Assessment & Plan Note (Signed)
Well-controlled on last TSH-reviewed today Continue Synthroid at current dose

## 2022-06-14 ENCOUNTER — Ambulatory Visit: Payer: Medicare PPO

## 2022-06-14 DIAGNOSIS — M25612 Stiffness of left shoulder, not elsewhere classified: Secondary | ICD-10-CM

## 2022-06-14 DIAGNOSIS — G8929 Other chronic pain: Secondary | ICD-10-CM

## 2022-06-14 DIAGNOSIS — M25512 Pain in left shoulder: Secondary | ICD-10-CM | POA: Diagnosis not present

## 2022-06-14 LAB — MICROALBUMIN / CREATININE URINE RATIO
Creatinine, Urine: 127.2 mg/dL
Microalb/Creat Ratio: 3 mg/g creat (ref 0–29)
Microalbumin, Urine: 3.9 ug/mL

## 2022-06-14 NOTE — Therapy (Signed)
OUTPATIENT PHYSICAL THERAPY SHOULDER TREATMENT   Patient Name: Erica Pineda MRN: [id number] DOB:[birth year], 72 y.o., female Today's Date: 06/14/2022  END OF SESSION:  PT End of Session - 06/14/22 1033     Visit Number 2    Number of Visits 17    Date for PT Re-Evaluation 08/25/22    Authorization Type [place]    Authorization - Visit Number 2    Authorization - Number of Visits 17    Progress Note Due on Visit 10    PT Start Time 1034    PT Stop Time 1115    PT Time Calculation (min) 41 min    Activity Tolerance Patient tolerated treatment well    Behavior During Therapy WFL for tasks assessed/performed             Past Medical History:  Diagnosis Date   Cancer (HCC)    Thyroid-follwed by Endocrine   Hyperlipidemia    Past Surgical History:  Procedure Laterality Date   ABDOMINAL HYSTERECTOMY     APPENDECTOMY  1996   BUNIONECTOMY  05/2007   and hammer-toe   CATARACT EXTRACTION Bilateral    CYSTOCELE REPAIR  1989   HERNIA REPAIR Left 1993   HYMENECTOMY  1971   TONSILLECTOMY AND ADENOIDECTOMY  1972   TOTAL THYROIDECTOMY  02/08/2009   Patient Active Problem List   Diagnosis Date Noted   Insomnia 05/02/2022   Chronic left shoulder pain 05/02/2022   Hypertension associated with diabetes (HCC) 08/23/2021   Morbid obesity (HCC) 08/22/2021   T2DM (type 2 diabetes mellitus) (HCC) 08/22/2021   Stress at home 08/22/2021   Family history of breast cancer 08/10/2016   Adult hypothyroidism 06/11/2014   Avitaminosis D 06/11/2014   Hyperlipidemia associated with type 2 diabetes mellitus (HCC) 05/07/2006    PCP: Erasmo Downer, MD  REFERRING PROVIDER: Erasmo Downer, MD  REFERRING DIAG: [phone] (ICD-10-CM) - Chronic left shoulder pain   THERAPY DIAG:  Chronic left shoulder pain  Decreased ROM of left shoulder  Rationale for Evaluation and Treatment: Rehabilitation  ONSET DATE: Several years  SUBJECTIVE:                                                                                                                                                                                       SUBJECTIVE STATEMENT:  Pt reports that she is experiencing less pain overall in the shoulders.  She states she is not sure if she is just dreaming, or if it's actually because of the exercises.  Pt reports her trip to [place] went well.     Hand dominance: Right  PERTINENT HISTORY: Pt has PMH including: HTN,  Hypothyroidism, Insomnia, Chronic L Shoulder Pain,  PAIN: Are you having pain? Yes: NPRS scale: 3/10 Pain location: L pectoral region Pain description: sharp Aggravating factors: Movement into ER/Total scaption, IR Relieving factors: Getting out of the position  PRECAUTIONS: None  WEIGHT BEARING RESTRICTIONS: No  FALLS: Has patient fallen in last 6 months? 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  UPPER EXTREMITY ROM:   Active ROM Right eval Left eval  Shoulder flexion 175 deg 156 deg  Shoulder extension    Shoulder abduction 182 deg 179 deg*  Shoulder adduction    Shoulder internal rotation    Shoulder external rotation    Elbow flexion    Elbow extension    Wrist flexion    Wrist extension    Wrist ulnar deviation    Wrist radial deviation    Wrist pronation    Wrist supination    (Blank rows = not tested)  UPPER EXTREMITY MMT:  MMT  Right eval Left eval  Shoulder flexion 4+ 4+  Shoulder extension    Shoulder abduction 4+ 3+  Shoulder adduction    Shoulder internal rotation    Shoulder external rotation    Middle trapezius    Lower trapezius    Elbow flexion    Elbow extension    Wrist flexion    Wrist extension    Wrist ulnar deviation    Wrist radial deviation    Wrist pronation    Wrist supination    Grip strength (lbs)    (Blank rows = not tested)  SHOULDER SPECIAL TESTS: Impingement tests: Neer impingement test: positive , Hawkins/Kennedy impingement test: negative, and Painful arc test: negative Rotator cuff assessment: Empty can test: positive , Full can test: positive , and Belly press test: negative Biceps assessment: Yergason's test: negative and Speed's test: positive   JOINT MOBILITY TESTING:  Pt has adequate ROM, with slight limitation in the L shoulder with abduction when comparing to the R.  Pt however does seem to have some joint restrictions in the L shoulder when performing joint mobilization.  Pt has hypomobility with AP glide and even more when doing inferior glides.`  PALPATION:  Pt has TP's noted in L pec, L supraspinatus, L infraspinatus, L subscapular region, and L biceps    TODAY'S TREATMENT: DATE: 06/14/22   TherEx:  Seated UBE, Level 4, 2.5 min forward/2.5 min backward, for increased endurance of the L shoulder while subjective information was gathered Seated shoulder rolls posteriorly, 2x10 Standing pec stretch bilaterally, but only able to feel stretch on the L UE, 30 sec bouts x4 each  Standing wall slides with serratus lift-off, 2x10   Manual:  Supine PROM of the L shoulder in all planes of mobility to improve overall ROM, specifically spending increased time and effort on IR/ER of the L shoulder Supine AP's of the L shoulder, grade II-III for pain modulation and improved ROM Supine inferior glide of the  L shoulder,  grade II-III for pain modulation and improved  ROM Prone STM with TP release technique applied to the infraspinatus, supraspinatus, UT, and deltoid region of the L shoulder for pain modulation       PATIENT EDUCATION: Education details: Pt educated throughout session about proper posture and technique with exercises. Improved exercise technique, movement at target joints, use of target muscles after min to mod verbal, visual, tactile cues Person educated: Patient Education method: Explanation, Demonstration, Tactile cues, Verbal cues, and Handouts Education comprehension: verbalized understanding, returned demonstration, verbal cues required, and tactile cues required  HOME EXERCISE PROGRAM:  Access Code: [email or web address] URL: [email or web address] Date: 06/02/2022 Prepared by: Tomasa Hose  Exercises - Isometric Shoulder Abduction with [employer] P[employer] at Wall  - 1 x daily - 7 x weekly - 3 sets - 10 reps - Isometric Shoulder Flexion with Ball at Wall  - 1 x daily - 7 x weekly - 3 sets - 10 reps - Isometric Shoulder Extension with Ball at [employer]  - 1 x daily - 7 x weekly - 3 sets - 10 reps - Isometric Shoulder Adduction  - 1 x daily - 7 x weekly - 3 sets - 10 reps - Isometric Shoulder External Rotation with Ball at Wall  - 1 x daily - 7 x weekly - 3 sets - 10 reps - Standing Isometric Shoulder Internal Rotation at Wall with Ball  - 1 x daily - 7 x weekly - 3 sets - 10 reps   ASSESSMENT:  CLINICAL IMPRESSION:  Pt responded well to the manual therapy approach and noticed an improvement in the overall mobility of the L shoulder.  Pt continues to have some limitations in place likely due to the tightness experienced in her L pec as noted by the doorway stretch response.  Pt given 2 new exercises as part of HEP in order to assist with serratus activation for proper musculature activation during shoulder movements, and pec stretch to alleviate tightness experienced in the L shoulder.   OBJECTIVE IMPAIRMENTS: decreased activity tolerance,  decreased ROM, decreased strength, hypomobility, obesity, and pain.   ACTIVITY LIMITATIONS: lifting, sleeping, and dressing  PARTICIPATION LIMITATIONS: occupation and church  PERSONAL FACTORS: Age, Fitness, Past/current experiences, Time since onset of injury/illness/exacerbation, and 1-2 comorbidities: HTN, insomnia  are also affecting patient's functional outcome.   REHAB POTENTIAL: Good  CLINICAL DECISION MAKING: Stable/uncomplicated  EVALUATION COMPLEXITY: Low   GOALS: Goals reviewed with patient? Yes  SHORT TERM GOALS: Target date: 06/30/2022  Pt will be independent with HEP in order to demonstrate increased ability to perform tasks related to occupation/hobbies. Baseline: Pt given HEP at initial evaluation for isometrics Goal status: INITIAL   LONG TERM GOALS: Target date: 08/25/2022  Patient will demonstrate improved function as evidenced by a score of 77 on FOTO measure for full participation in activities at home and in the community. Baseline: 74 Goal status: INITIAL  2.  Patient will demonstrate adequate shoulder ROM and strength to be able to don bra and dress independently with pain less than 3/10. Baseline: Pt reports 4/10 pain with IR Goal status: INITIAL  3.  Patient will increase L UE gross strength to 5/5 as to improve functional strength for activity tolerance needed for piano playing and increased ADL ability. Baseline: Pt demonstrates 4/5 gross strength and noted weakness with piano playing. Goal status: INITIAL  PLAN:  PT FREQUENCY: 2x/week  PT DURATION: 12 weeks  PLANNED INTERVENTIONS: Therapeutic exercises, Therapeutic activity, Neuromuscular re-education, Balance training, Gait training, Patient/Family  education, Self Care, Joint mobilization, Joint manipulation, Vestibular training, Canalith repositioning, Aquatic Therapy, Dry Needling, Spinal manipulation, Spinal mobilization, Cryotherapy, Moist heat, Taping, Ultrasound, Manual therapy, and  Re-evaluation  PLAN FOR NEXT SESSION: Continues with strengthening of the L shoulder within pain free range.   Nolon Bussing, PT, DPT Physical Therapist - [facility]  06/14/22, 1:01 PM

## 2022-06-16 ENCOUNTER — Ambulatory Visit: Payer: Medicare PPO

## 2022-06-16 LAB — HM DIABETES EYE EXAM

## 2022-06-19 ENCOUNTER — Ambulatory Visit: Payer: Medicare PPO

## 2022-06-19 DIAGNOSIS — M25512 Pain in left shoulder: Secondary | ICD-10-CM | POA: Diagnosis not present

## 2022-06-19 DIAGNOSIS — M25612 Stiffness of left shoulder, not elsewhere classified: Secondary | ICD-10-CM

## 2022-06-19 DIAGNOSIS — G8929 Other chronic pain: Secondary | ICD-10-CM

## 2022-06-19 NOTE — Therapy (Signed)
OUTPATIENT PHYSICAL THERAPY SHOULDER TREATMENT   Patient Name: Erica Pineda MRN: [id number] DOB:03/20/1950, 72 y.o., female Today's Date: 06/19/2022  END OF SESSION:  PT End of Session - 06/19/22 1439     Visit Number 3    Number of Visits 17    Date for PT Re-Evaluation 08/25/22    Authorization Type [place]    Authorization - Visit Number 3    Authorization - Number of Visits 17    Progress Note Due on Visit 10    PT Start Time 1436    PT Stop Time 1515    PT Time Calculation (min) 39 min    Activity Tolerance Patient tolerated treatment well    Behavior During Therapy WFL for tasks assessed/performed             Past Medical History:  Diagnosis Date   Cancer (HCC)    Thyroid-follwed by Endocrine   Hyperlipidemia    Past Surgical History:  Procedure Laterality Date   ABDOMINAL HYSTERECTOMY     APPENDECTOMY  1996   BUNIONECTOMY  05/2007   and hammer-toe   CATARACT EXTRACTION Bilateral    CYSTOCELE REPAIR  1989   HERNIA REPAIR Left 1993   HYMENECTOMY  1971   TONSILLECTOMY AND ADENOIDECTOMY  1972   TOTAL THYROIDECTOMY  02/08/2009   Patient Active Problem List   Diagnosis Date Noted   Insomnia 05/02/2022   Chronic left shoulder pain 05/02/2022   Hypertension associated with diabetes (HCC) 08/23/2021   Morbid obesity (HCC) 08/22/2021   T2DM (type 2 diabetes mellitus) (HCC) 08/22/2021   Stress at home 08/22/2021   Family history of breast cancer 08/10/2016   Adult hypothyroidism 06/11/2014   Avitaminosis D 06/11/2014   Hyperlipidemia associated with type 2 diabetes mellitus (HCC) 05/07/2006    PCP: Erasmo Downer, MD  REFERRING PROVIDER: Erasmo Downer, MD  REFERRING DIAG: [phone] (ICD-10-CM) - Chronic left shoulder pain   THERAPY DIAG:  Chronic left shoulder pain  Decreased ROM of left shoulder  Rationale for Evaluation and Treatment: Rehabilitation  ONSET DATE: Several years  SUBJECTIVE:                                                                                                                                                                                       SUBJECTIVE STATEMENT:  Pt reports the exercises have been going well.  Pt notes that the pec stretch has been      Hand dominance: Right  PERTINENT HISTORY: Pt has PMH including: HTN, Hypothyroidism, Insomnia, Chronic L Shoulder Pain,  PAIN: Are you having pain? Yes: NPRS scale: 4/10 Pain location: L pectoral region Pain description:  sharp Aggravating factors: Movement into ER/Total scaption, IR Relieving factors: Getting out of the position  PRECAUTIONS: None  WEIGHT BEARING RESTRICTIONS: No  FALLS: Has patient fallen in last 6 months? 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  UPPER EXTREMITY ROM:   Active ROM Right eval Left eval  Shoulder flexion 175 deg 156 deg  Shoulder extension    Shoulder abduction 182 deg 179 deg*  Shoulder adduction    Shoulder internal rotation    Shoulder external rotation    Elbow flexion    Elbow extension    Wrist flexion    Wrist extension    Wrist ulnar deviation    Wrist radial deviation    Wrist pronation    Wrist supination    (Blank rows = not tested)  UPPER EXTREMITY MMT:  MMT Right eval Left eval  Shoulder flexion 4+ 4+  Shoulder extension    Shoulder abduction 4+ 3+  Shoulder adduction     Shoulder internal rotation    Shoulder external rotation    Middle trapezius    Lower trapezius    Elbow flexion    Elbow extension    Wrist flexion    Wrist extension    Wrist ulnar deviation    Wrist radial deviation    Wrist pronation    Wrist supination    Grip strength (lbs)    (Blank rows = not tested)  SHOULDER SPECIAL TESTS: Impingement tests: Neer impingement test: positive , Hawkins/Kennedy impingement test: negative, and Painful arc test: negative Rotator cuff assessment: Empty can test: positive , Full can test: positive , and Belly press test: negative Biceps assessment: Yergason's test: negative and Speed's test: positive   JOINT MOBILITY TESTING:  Pt has adequate ROM, with slight limitation in the L shoulder with abduction when comparing to the R.  Pt however does seem to have some joint restrictions in the L shoulder when performing joint mobilization.  Pt has hypomobility with AP glide and even more when doing inferior glides.`  PALPATION:  Pt has TP's noted in L pec, L supraspinatus, L infraspinatus, L subscapular region, and L biceps    TODAY'S TREATMENT: DATE: 06/19/22  TherEx:  Seated UBE, Level 4, 2.5 min forward/2.5 min backward, for increased endurance of the L shoulder while subjective information was gathered Seated shoulder rolls posteriorly, 2x10 Standing pec stretch bilaterally, but only able to feel stretch on the L UE, 30 sec bouts x4 each  Standing wall slides with serratus lift-off, 2x10   Manual:  Supine PROM of the L shoulder in all planes of mobility to improve overall ROM, specifically spending increased time and effort on IR/ER of the L shoulder Supine AP's of the L shoulder, grade II-III for pain modulation and improved ROM Supine inferior glide of the L shoulder,  grade II-III for pain modulation and improved ROM Supine STM with TP release technique applied to pecs in order to alleviate  pain and improve tissue  restrictions   Trigger Point Dry-Needling  Treatment instructions: Expect mild to moderate muscle soreness. S/S of pneumothorax if dry needled over a lung field, and to seek immediate medical attention should they occur. Patient verbalized understanding of these instructions and education.  Patient Consent Given: Yes Education handout provided: No Muscles treated: L pec minor with lateral grasp and L UT in prone position Electrical stimulation performed: No Parameters: N/A Treatment response/outcome: Pt responded well to the dry needling, noting a reduction in overall tightness of the L UT.     PATIENT EDUCATION: Education details: Pt educated throughout session about proper posture and technique with exercises. Improved exercise technique, movement at target joints, use of target muscles after min to mod verbal, visual, tactile cues Person educated: Patient Education method: Explanation, Demonstration, Tactile cues, Verbal cues, and Handouts Education comprehension: verbalized understanding, returned demonstration, verbal cues required, and tactile cues required    HOME EXERCISE PROGRAM:  Access Code: [email or web address] URL: [email or web address] Date: 06/02/2022 Prepared by: Tomasa Hose  Exercises - Isometric Shoulder Abduction with [employer] at Wall  - 1 x daily - 7 x weekly - 3 sets - 10 reps - Isometric Shoulder Flexion with Ball at Wall  - 1 x daily - 7 x weekly - 3 sets - 10 reps - Isometric Shoulder Extension with Ball at [employer]  - 1 x daily - 7 x weekly - 3 sets - 10 reps - Isometric Shoulder Adduction  - 1 x daily - 7 x weekly - 3 sets - 10 reps - Isometric Shoulder External Rotation with Ball at Wall  - 1 x daily - 7 x weekly - 3 sets - 10 reps - Standing Isometric Shoulder Internal Rotation at Wall with Ball  - 1 x daily - 7 x weekly - 3 sets - 10 reps    ASSESSMENT:  CLINICAL IMPRESSION:  Pt continues to respond well to manual therapy and improves overall ROM.   Pt will continue to be monitored with dry needling in the future session to see response.  Pt will continue to progress with strengthening exercises as well in order to improve the functional strength of the shoulder.   Pt will continue to benefit from skilled therapy to address remaining deficits in order to improve overall QoL and return to PLOF.      OBJECTIVE IMPAIRMENTS: decreased activity tolerance, decreased ROM, decreased strength, hypomobility, obesity, and pain.   ACTIVITY LIMITATIONS: lifting, sleeping, and dressing  PARTICIPATION LIMITATIONS: occupation and church  PERSONAL FACTORS: Age, Fitness, Past/current experiences, Time since onset of injury/illness/exacerbation, and 1-2 comorbidities: HTN, insomnia  are also affecting patient's functional outcome.   REHAB POTENTIAL: Good  CLINICAL DECISION MAKING: Stable/uncomplicated  EVALUATION COMPLEXITY: Low   GOALS: Goals reviewed with patient? Yes  SHORT TERM GOALS: Target date: 06/30/2022  Pt will be independent with HEP in order to demonstrate increased ability to perform tasks related to occupation/hobbies. Baseline: Pt given HEP at initial evaluation for isometrics Goal status: INITIAL   LONG TERM GOALS: Target date: 08/25/2022  Patient will demonstrate improved function as evidenced by a score of 77 on FOTO measure for full participation in activities at home and in the community. Baseline: 74 Goal status: INITIAL  2.  Patient will demonstrate adequate shoulder ROM and strength to be able to don bra and dress independently with pain less than 3/10. Baseline: Pt reports 4/10 pain with IR Goal status: INITIAL  3.  Patient will increase L UE gross strength  to 5/5 as to improve functional strength for activity tolerance needed for piano playing and increased ADL ability. Baseline: Pt demonstrates 4/5 gross strength and noted weakness with piano playing. Goal status: INITIAL  PLAN:  PT FREQUENCY: 2x/week  PT  DURATION: 12 weeks  PLANNED INTERVENTIONS: Therapeutic exercises, Therapeutic activity, Neuromuscular re-education, Balance training, Gait training, Patient/Family education, Self Care, Joint mobilization, Joint manipulation, Vestibular training, Canalith repositioning, Aquatic Therapy, Dry Needling, Spinal manipulation, Spinal mobilization, Cryotherapy, Moist heat, Taping, Ultrasound, Manual therapy, and Re-evaluation  PLAN FOR NEXT SESSION: Continues with strengthening of the L shoulder within pain free range.   Nolon Bussing, PT, DPT Physical Therapist - [facility]  06/19/22, 5:07 PM

## 2022-06-21 ENCOUNTER — Ambulatory Visit: Payer: Medicare PPO

## 2022-06-21 DIAGNOSIS — M25512 Pain in left shoulder: Secondary | ICD-10-CM | POA: Diagnosis not present

## 2022-06-21 DIAGNOSIS — G8929 Other chronic pain: Secondary | ICD-10-CM

## 2022-06-21 DIAGNOSIS — M25612 Stiffness of left shoulder, not elsewhere classified: Secondary | ICD-10-CM

## 2022-06-21 NOTE — Therapy (Signed)
OUTPATIENT PHYSICAL THERAPY SHOULDER TREATMENT   Patient Name: Erica Pineda MRN: [id number] DOB:10-Apr-1950, 72 y.o., female Today's Date: 06/21/2022  END OF SESSION:  PT End of Session - 06/21/22 1340     Visit Number 4    Number of Visits 17    Date for PT Re-Evaluation 08/25/22    Authorization Type [place]    Authorization - Visit Number 4    Authorization - Number of Visits 17    Progress Note Due on Visit 10    PT Start Time 1338    PT Stop Time 1420    PT Time Calculation (min) 42 min    Activity Tolerance Patient tolerated treatment well    Behavior During Therapy WFL for tasks assessed/performed             Past Medical History:  Diagnosis Date   Cancer (HCC)    Thyroid-follwed by Endocrine   Hyperlipidemia    Past Surgical History:  Procedure Laterality Date   ABDOMINAL HYSTERECTOMY     APPENDECTOMY  1996   BUNIONECTOMY  05/2007   and hammer-toe   CATARACT EXTRACTION Bilateral    CYSTOCELE REPAIR  1989   HERNIA REPAIR Left 1993   HYMENECTOMY  1971   TONSILLECTOMY AND ADENOIDECTOMY  1972   TOTAL THYROIDECTOMY  02/08/2009   Patient Active Problem List   Diagnosis Date Noted   Insomnia 05/02/2022   Chronic left shoulder pain 05/02/2022   Hypertension associated with diabetes (HCC) 08/23/2021   Morbid obesity (HCC) 08/22/2021   T2DM (type 2 diabetes mellitus) (HCC) 08/22/2021   Stress at home 08/22/2021   Family history of breast cancer 08/10/2016   Adult hypothyroidism 06/11/2014   Avitaminosis D 06/11/2014   Hyperlipidemia associated with type 2 diabetes mellitus (HCC) 05/07/2006    PCP: Erasmo Downer, MD  REFERRING PROVIDER: Erasmo Downer, MD  REFERRING DIAG: [phone] (ICD-10-CM) - Chronic left shoulder pain   THERAPY DIAG:  Chronic left shoulder pain  Decreased ROM of left shoulder  Rationale for Evaluation and Treatment: Rehabilitation  ONSET DATE: Several years  SUBJECTIVE:                                                                                                                                                                                       SUBJECTIVE STATEMENT:  Pt reports that she is slightly sore due to pec stretches, but is overall doing well.  Pt is also somewhat stressed from the trip and having to pack which is causing more tightness in the UT's.   Hand dominance: Right  PERTINENT HISTORY: Pt has PMH including: HTN, Hypothyroidism, Insomnia, Chronic L Shoulder Pain,  PAIN: Are you having pain? Yes: NPRS scale: 4/10 Pain location: L pectoral region Pain description: sharp Aggravating factors: Movement into ER/Total scaption, IR Relieving factors: Getting out of the position  PRECAUTIONS: None  WEIGHT BEARING RESTRICTIONS: No  FALLS: Has patient fallen in last 6 months? 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  UPPER EXTREMITY ROM:   Active ROM Right eval Left eval  Shoulder flexion 175 deg 156 deg  Shoulder extension    Shoulder abduction 182 deg 179 deg*  Shoulder adduction    Shoulder internal rotation    Shoulder external rotation    Elbow flexion    Elbow extension    Wrist flexion    Wrist extension    Wrist ulnar deviation    Wrist radial deviation    Wrist pronation    Wrist supination    (Blank rows = not tested)  UPPER EXTREMITY MMT:  MMT Right eval Left eval   Shoulder flexion 4+ 4+  Shoulder extension    Shoulder abduction 4+ 3+  Shoulder adduction    Shoulder internal rotation    Shoulder external rotation    Middle trapezius    Lower trapezius    Elbow flexion    Elbow extension    Wrist flexion    Wrist extension    Wrist ulnar deviation    Wrist radial deviation    Wrist pronation    Wrist supination    Grip strength (lbs)    (Blank rows = not tested)  SHOULDER SPECIAL TESTS: Impingement tests: Neer impingement test: positive , Hawkins/Kennedy impingement test: negative, and Painful arc test: negative Rotator cuff assessment: Empty can test: positive , Full can test: positive , and Belly press test: negative Biceps assessment: Yergason's test: negative and Speed's test: positive   JOINT MOBILITY TESTING:  Pt has adequate ROM, with slight limitation in the L shoulder with abduction when comparing to the R.  Pt however does seem to have some joint restrictions in the L shoulder when performing joint mobilization.  Pt has hypomobility with AP glide and even more when doing inferior glides.`  PALPATION:  Pt has TP's noted in L pec, L supraspinatus, L infraspinatus, L subscapular region, and L biceps    TODAY'S TREATMENT: DATE: 06/21/22  TherEx:  Seated UBE, Level 4, 3 min forward / 3 min backward, for increased endurance of the L shoulder while subjective information was gathered Seated shoulder rolls posteriorly, 2x10 Standing pec stretch bilaterally, but only able to feel stretch on the L UE, 30 sec bouts x4 each     Manual:  Supine PROM of the L shoulder in all planes of mobility to improve overall ROM, specifically spending increased time and effort on IR/ER of the L shoulder Supine AP's of the L shoulder, grade II-III for pain modulation and improved ROM Supine inferior glide of the L shoulder,  grade II-III for pain modulation and improved ROM Supine  STM with TP release technique applied to pecs in order to  alleviate pain and improve tissue restrictions Supine STM to cervical region to increase extensibility of the paraspinals Supine suboccipital release technique to decrease cervicalgia Supine manual traction performed in order to increase joint space in cervical region for pain relief Supine cervical upglides/downglides, 30 sec bouts Supine UT/Levator stretch, 30 sec bouts to increase tissue extensibility of the cervical region      PATIENT EDUCATION: Education details: Pt educated throughout session about proper posture and technique with exercises. Improved exercise technique, movement at target joints, use of target muscles after min to mod verbal, visual, tactile cues Person educated: Patient Education method: Explanation, Demonstration, Tactile cues, Verbal cues, and Handouts Education comprehension: verbalized understanding, returned demonstration, verbal cues required, and tactile cues required    HOME EXERCISE PROGRAM:  Access Code: [email or web address] URL: [email or web address] Date: 06/02/2022 Prepared by: Tomasa Hose  Exercises - Isometric Shoulder Abduction with Newman Pies at Wall  - 1 x daily - 7 x weekly - 3 sets - 10 reps - Isometric Shoulder Flexion with Ball at Wall  - 1 x daily - 7 x weekly - 3 sets - 10 reps - Isometric Shoulder Extension with Ball at [employer]  - 1 x daily - 7 x weekly - 3 sets - 10 reps - Isometric Shoulder Adduction  - 1 x daily - 7 x weekly - 3 sets - 10 reps - Isometric Shoulder External Rotation with Ball at Wall  - 1 x daily - 7 x weekly - 3 sets - 10 reps - Standing Isometric Shoulder Internal Rotation at Wall with Ball  - 1 x daily - 7 x weekly - 3 sets - 10 reps    ASSESSMENT:  CLINICAL IMPRESSION:  Pt responded well tot he manual therapy approach and noted a reduction in her painful symptoms of the L shoulder.  Pt also had a reduction in pec tightness since the last visit.  Pt advised to continue with current HEP and stretches in order to  improve ROM and pain modulation while away out of town.   Pt will continue to benefit from skilled therapy to address remaining deficits in order to improve overall QoL and return to PLOF.       OBJECTIVE IMPAIRMENTS: decreased activity tolerance, decreased ROM, decreased strength, hypomobility, obesity, and pain.   ACTIVITY LIMITATIONS: lifting, sleeping, and dressing  PARTICIPATION LIMITATIONS: occupation and church  PERSONAL FACTORS: Age, Fitness, Past/current experiences, Time since onset of injury/illness/exacerbation, and 1-2 comorbidities: HTN, insomnia  are also affecting patient's functional outcome.   REHAB POTENTIAL: Good  CLINICAL DECISION MAKING: Stable/uncomplicated  EVALUATION COMPLEXITY: Low   GOALS: Goals reviewed with patient? Yes  SHORT TERM GOALS: Target date: 06/30/2022  Pt will be independent with HEP in order to demonstrate increased ability to perform tasks related to occupation/hobbies. Baseline: Pt given HEP at initial evaluation for isometrics Goal status: INITIAL   LONG TERM GOALS: Target date: 08/25/2022  Patient will demonstrate improved function as evidenced by a score of 77 on FOTO measure for full participation in activities at home and in the community. Baseline: 74 Goal status: INITIAL  2.  Patient will demonstrate adequate shoulder ROM and strength to be able to don bra and dress independently with pain less than 3/10. Baseline: Pt reports 4/10 pain with IR Goal status: INITIAL  3.  Patient will increase L UE gross strength to 5/5 as to improve functional strength for activity tolerance needed for piano playing and  increased ADL ability. Baseline: Pt demonstrates 4/5 gross strength and noted weakness with piano playing. Goal status: INITIAL  PLAN:  PT FREQUENCY: 2x/week  PT DURATION: 12 weeks  PLANNED INTERVENTIONS: Therapeutic exercises, Therapeutic activity, Neuromuscular re-education, Balance training, Gait training,  Patient/Family education, Self Care, Joint mobilization, Joint manipulation, Vestibular training, Canalith repositioning, Aquatic Therapy, Dry Needling, Spinal manipulation, Spinal mobilization, Cryotherapy, Moist heat, Taping, Ultrasound, Manual therapy, and Re-evaluation  PLAN FOR NEXT SESSION: Continues with strengthening of the L shoulder within pain free range.   Nolon Bussing, PT, DPT Physical Therapist - [facility]  06/21/22, 1:40 PM

## 2022-07-05 ENCOUNTER — Ambulatory Visit: Payer: Medicare PPO | Attending: Family Medicine

## 2022-07-05 DIAGNOSIS — M25612 Stiffness of left shoulder, not elsewhere classified: Secondary | ICD-10-CM | POA: Insufficient documentation

## 2022-07-05 DIAGNOSIS — G8929 Other chronic pain: Secondary | ICD-10-CM | POA: Insufficient documentation

## 2022-07-05 DIAGNOSIS — M25512 Pain in left shoulder: Secondary | ICD-10-CM | POA: Insufficient documentation

## 2022-07-06 DIAGNOSIS — M9901 Segmental and somatic dysfunction of cervical region: Secondary | ICD-10-CM | POA: Diagnosis not present

## 2022-07-06 DIAGNOSIS — M5414 Radiculopathy, thoracic region: Secondary | ICD-10-CM | POA: Diagnosis not present

## 2022-07-06 DIAGNOSIS — M9902 Segmental and somatic dysfunction of thoracic region: Secondary | ICD-10-CM | POA: Diagnosis not present

## 2022-07-06 DIAGNOSIS — M542 Cervicalgia: Secondary | ICD-10-CM | POA: Diagnosis not present

## 2022-07-07 ENCOUNTER — Ambulatory Visit: Payer: Medicare PPO

## 2022-07-07 DIAGNOSIS — M25612 Stiffness of left shoulder, not elsewhere classified: Secondary | ICD-10-CM

## 2022-07-07 DIAGNOSIS — M25512 Pain in left shoulder: Secondary | ICD-10-CM | POA: Diagnosis not present

## 2022-07-07 DIAGNOSIS — G8929 Other chronic pain: Secondary | ICD-10-CM | POA: Diagnosis not present

## 2022-07-07 NOTE — Therapy (Signed)
OUTPATIENT PHYSICAL THERAPY SHOULDER TREATMENT   Patient Name: Erica Pineda MRN: [id number] DOB:1950/12/26, 72 y.o., female Today's Date: 07/07/2022  END OF SESSION:  PT End of Session - 07/07/22 1010     Visit Number 5    Number of Visits 17    Date for PT Re-Evaluation 08/25/22    Authorization Type [place]    Authorization - Visit Number 5    Authorization - Number of Visits 17    Progress Note Due on Visit 10    PT Start Time 1012    PT Stop Time 1055    PT Time Calculation (min) 43 min    Activity Tolerance Patient tolerated treatment well    Behavior During Therapy WFL for tasks assessed/performed             Past Medical History:  Diagnosis Date   Cancer (HCC)    Thyroid-follwed by Endocrine   Hyperlipidemia    Past Surgical History:  Procedure Laterality Date   ABDOMINAL HYSTERECTOMY     APPENDECTOMY  1996   BUNIONECTOMY  05/2007   and hammer-toe   CATARACT EXTRACTION Bilateral    CYSTOCELE REPAIR  1989   HERNIA REPAIR Left 1993   HYMENECTOMY  1971   TONSILLECTOMY AND ADENOIDECTOMY  1972   TOTAL THYROIDECTOMY  02/08/2009   Patient Active Problem List   Diagnosis Date Noted   Insomnia 05/02/2022   Chronic left shoulder pain 05/02/2022   Hypertension associated with diabetes (HCC) 08/23/2021   Morbid obesity (HCC) 08/22/2021   T2DM (type 2 diabetes mellitus) (HCC) 08/22/2021   Stress at home 08/22/2021   Family history of breast cancer 08/10/2016   Adult hypothyroidism 06/11/2014   Avitaminosis D 06/11/2014   Hyperlipidemia associated with type 2 diabetes mellitus (HCC) 05/07/2006    PCP: Erasmo Downer, MD  REFERRING PROVIDER: Erasmo Downer, MD  REFERRING DIAG: [phone] (ICD-10-CM) - Chronic left shoulder pain   THERAPY DIAG:  Chronic left shoulder pain  Decreased ROM of left shoulder  Rationale for Evaluation and Treatment: Rehabilitation  ONSET DATE: Several years  SUBJECTIVE:                                                                                                                                                                                       SUBJECTIVE STATEMENT:  Pt reports her L shoulder is improved. Compliant with HEP over her vacation. Pt reports she is able to hook her bra behind her back without grimacing. L shoulder 2-3/10 NPS over her trip and currently as worst pain.    Hand dominance: Right  PERTINENT HISTORY: Pt has PMH including: HTN, Hypothyroidism, Insomnia, Chronic  L Shoulder Pain,  PAIN: Are you having pain? Yes: NPRS scale: 4/10 Pain location: L pectoral region Pain description: sharp Aggravating factors: Movement into ER/Total scaption, IR Relieving factors: Getting out of the position  PRECAUTIONS: None  WEIGHT BEARING RESTRICTIONS: No  FALLS: Has patient fallen in last 6 months? 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  UPPER EXTREMITY ROM:   Active ROM Right eval Left eval  Shoulder flexion 175 deg 156 deg  Shoulder extension    Shoulder abduction 182 deg 179 deg*  Shoulder adduction    Shoulder internal rotation 85 deg 72 deg  Shoulder external rotation 72 deg 65 deg*  Elbow flexion    Elbow extension    Wrist flexion    Wrist extension    Wrist ulnar deviation    Wrist radial deviation    Wrist pronation    Wrist supination    (Blank rows = not tested)   UPPER  EXTREMITY MMT:  MMT Right eval Left eval  Shoulder flexion 4+ 4+  Shoulder extension    Shoulder abduction 4+ 3+  Shoulder adduction    Shoulder internal rotation 5 5  Shoulder external rotation 5 5  Middle trapezius    Lower trapezius    Elbow flexion    Elbow extension    Wrist flexion    Wrist extension    Wrist ulnar deviation    Wrist radial deviation    Wrist pronation    Wrist supination    Grip strength (lbs)    (Blank rows = not tested)  SHOULDER SPECIAL TESTS: Impingement tests: Neer impingement test: positive , Hawkins/Kennedy impingement test: negative, and Painful arc test: negative Rotator cuff assessment: Empty can test: positive , Full can test: positive , and Belly press test: negative Biceps assessment: Yergason's test: negative and Speed's test: positive   JOINT MOBILITY TESTING:  Pt has adequate ROM, with slight limitation in the L shoulder with abduction when comparing to the R.  Pt however does seem to have some joint restrictions in the L shoulder when performing joint mobilization.  Pt has hypomobility with AP glide and even more when doing inferior glides.`  PALPATION:  Pt has TP's noted in L pec, L supraspinatus, L infraspinatus, L subscapular region, and L biceps    TODAY'S TREATMENT: DATE: 07/07/22   There.Ex:   Seated UBE, Level 4, 3 min forward / 3 min backward, for increased endurance of the L shoulder while subjective information was gathered  Measured R/L shoulder ER/IR and MMT in supine   See chart above   Supine PROM of the L shoulder in all planes of mobility to improve overall ROM, specifically spending increased time and effort on IR/ER of the L shoulder  Supine and standing AAROM shoulder flexion overhead with dowel: x20 supine, x20 seated   Standing B shoulder extension AAROM with dowel: pronated grip, x20  Standing behind the back 3# DB passes  CCW: 3x30 sec. Updated to HEP.    PATIENT EDUCATION: Education details: Pt  educated throughout session about proper posture and technique with exercises. Improved exercise technique, movement at target joints, use of target muscles after min to mod verbal, visual, tactile cues Person educated: Patient Education method: Explanation, Demonstration, Tactile cues, Verbal cues, and Handouts Education comprehension: verbalized understanding, returned demonstration, verbal cues required, and tactile cues required    HOME EXERCISE PROGRAM: Access Code: [email or web address] URL: [email or web address] Date: 07/07/2022 Prepared by: Ronnie Derby  Exercises - Isometric Shoulder Abduction with [employer] P[employer] at Wall  - 1 x daily - 7 x weekly - 3 sets - 10 reps - Isometric Shoulder Flexion with Ball at Wall  - 1 x daily - 7 x weekly - 3 sets - 10 reps - Isometric Shoulder Extension with Ball at [employer]  - 1 x daily - 7 x weekly - 3 sets - 10 reps - Isometric Shoulder Adduction  - 1 x daily - 7 x weekly - 3 sets - 10 reps - Isometric Shoulder External Rotation with Ball at Wall  - 1 x daily - 7 x weekly - 3 sets - 10 reps - Standing Isometric Shoulder Internal Rotation at Wall with Ball  - 1 x daily - 7 x weekly - 3 sets - 10 reps - Scapular Wall Slides  - 1 x daily - 7 x weekly - 3 sets - 10 reps - Single Arm Doorway Pec Stretch at 90 Degrees Abduction (Mirrored)  - 1 x daily - 7 x weekly - 3 sets - 10 reps - Standing Shoulder Flexion AAROM with Dowel  - 1 x daily - 7 x weekly - 3 sets - 10 reps   Access Code: [email or web address] URL: [email or web address] Date: 06/02/2022 Prepared by: Tomasa Hose  Exercises - Isometric Shoulder Abduction with [employer] P[employer] at Wall  - 1 x daily - 7 x weekly - 3 sets - 10 reps - Isometric Shoulder Flexion with Ball at Wall  - 1 x daily - 7 x weekly - 3 sets - 10 reps - Isometric Shoulder Extension with Ball at [employer]  - 1 x daily - 7 x weekly - 3 sets - 10 reps - Isometric Shoulder Adduction  - 1 x daily - 7 x weekly - 3 sets - 10 reps - Isometric  Shoulder External Rotation with Ball at Wall  - 1 x daily - 7 x weekly - 3 sets - 10 reps - Standing Isometric Shoulder Internal Rotation at Wall with Ball  - 1 x daily - 7 x weekly - 3 sets - 10 reps    ASSESSMENT:  CLINICAL IMPRESSION: Pt arriving after 2 week hiatus due to vacation. Pt reporting diligent HEP compliance with reduced L shoulder pain. Updated HEP to improve further shoulder strengthening and mobility for scapulohumeral rhythm over ahead and in extension, IR and adduction for behind back ADL completion. Pt understanding of HEP updates. No worsening of pain post session.  Pt will continue to benefit from skilled therapy to address remaining deficits in order to improve overall QoL and return to PLOF.     OBJECTIVE IMPAIRMENTS: decreased activity tolerance, decreased ROM, decreased strength, hypomobility, obesity, and pain.   ACTIVITY LIMITATIONS: lifting, sleeping, and dressing  PARTICIPATION LIMITATIONS: occupation and church  PERSONAL FACTORS: Age, Fitness, Past/current experiences, Time since onset of injury/illness/exacerbation, and 1-2 comorbidities: HTN, insomnia  are also affecting patient's functional outcome.   REHAB POTENTIAL: Good  CLINICAL DECISION MAKING: Stable/uncomplicated  EVALUATION COMPLEXITY: Low  GOALS: Goals reviewed with patient? Yes  SHORT TERM GOALS: Target date: 06/30/2022  Pt will be independent with HEP in order to demonstrate increased ability to perform tasks related to occupation/hobbies. Baseline: Pt given HEP at initial evaluation for isometrics Goal status: INITIAL   LONG TERM GOALS: Target date: 08/25/2022  Patient will demonstrate improved function as evidenced by a score of 77 on FOTO measure for full participation in activities at home and in the community. Baseline: 74 Goal status: INITIAL  2.  Patient will demonstrate adequate shoulder ROM and strength to be able to don bra and dress independently with pain less than  3/10. Baseline: Pt reports 4/10 pain with IR Goal status: INITIAL  3.  Patient will increase L UE gross strength to 5/5 as to improve functional strength for activity tolerance needed for piano playing and increased ADL ability. Baseline: Pt demonstrates 4/5 gross strength and noted weakness with piano playing. Goal status: INITIAL  PLAN:  PT FREQUENCY: 2x/week  PT DURATION: 12 weeks  PLANNED INTERVENTIONS: Therapeutic exercises, Therapeutic activity, Neuromuscular re-education, Balance training, Gait training, Patient/Family education, Self Care, Joint mobilization, Joint manipulation, Vestibular training, Canalith repositioning, Aquatic Therapy, Dry Needling, Spinal manipulation, Spinal mobilization, Cryotherapy, Moist heat, Taping, Ultrasound, Manual therapy, and Re-evaluation  PLAN FOR NEXT SESSION: Continue with strengthening of the L shoulder within pain free range.   Delphia Grates. Fairly IV, PT, DPT Physical Therapist- [place]  [facility]   07/07/22, 11:16 AM

## 2022-07-10 DIAGNOSIS — G8929 Other chronic pain: Secondary | ICD-10-CM | POA: Diagnosis not present

## 2022-07-10 DIAGNOSIS — M1712 Unilateral primary osteoarthritis, left knee: Secondary | ICD-10-CM | POA: Diagnosis not present

## 2022-07-10 DIAGNOSIS — M7052 Other bursitis of knee, left knee: Secondary | ICD-10-CM | POA: Diagnosis not present

## 2022-07-12 ENCOUNTER — Ambulatory Visit: Payer: Medicare PPO

## 2022-07-12 DIAGNOSIS — M25612 Stiffness of left shoulder, not elsewhere classified: Secondary | ICD-10-CM | POA: Diagnosis not present

## 2022-07-12 DIAGNOSIS — M25512 Pain in left shoulder: Secondary | ICD-10-CM | POA: Diagnosis not present

## 2022-07-12 DIAGNOSIS — G8929 Other chronic pain: Secondary | ICD-10-CM | POA: Diagnosis not present

## 2022-07-12 NOTE — Therapy (Signed)
OUTPATIENT PHYSICAL THERAPY SHOULDER TREATMENT   Patient Name: Erica Pineda MRN: [id number] DOB:[birth year], 72 y.o., female Today's Date: 07/12/2022  END OF SESSION:  PT End of Session - 07/12/22 1039     Visit Number 6    Number of Visits 17    Date for PT Re-Evaluation 08/25/22    Authorization Type [place]    Authorization - Visit Number 6    Authorization - Number of Visits 17    Progress Note Due on Visit 10    PT Start Time 1035    PT Stop Time 1115    PT Time Calculation (min) 40 min    Activity Tolerance Patient tolerated treatment well    Behavior During Therapy WFL for tasks assessed/performed             Past Medical History:  Diagnosis Date   Cancer (HCC)    Thyroid-follwed by Endocrine   Hyperlipidemia    Past Surgical History:  Procedure Laterality Date   ABDOMINAL HYSTERECTOMY     APPENDECTOMY  1996   BUNIONECTOMY  05/2007   and hammer-toe   CATARACT EXTRACTION Bilateral    CYSTOCELE REPAIR  1989   HERNIA REPAIR Left 1993   HYMENECTOMY  1971   TONSILLECTOMY AND ADENOIDECTOMY  1972   TOTAL THYROIDECTOMY  02/08/2009   Patient Active Problem List   Diagnosis Date Noted   Insomnia 05/02/2022   Chronic left shoulder pain 05/02/2022   Hypertension associated with diabetes (HCC) 08/23/2021   Morbid obesity (HCC) 08/22/2021   T2DM (type 2 diabetes mellitus) (HCC) 08/22/2021   Stress at home 08/22/2021   Family history of breast cancer 08/10/2016   Adult hypothyroidism 06/11/2014   Avitaminosis D 06/11/2014   Hyperlipidemia associated with type 2 diabetes mellitus (HCC) 05/07/2006    PCP: Erasmo Downer, MD  REFERRING PROVIDER: Erasmo Downer, MD  REFERRING DIAG: [phone] (ICD-10-CM) - Chronic left shoulder pain   THERAPY DIAG:  Chronic left shoulder pain  Decreased ROM of left shoulder  Rationale for Evaluation and Treatment: Rehabilitation  ONSET DATE: Several years  SUBJECTIVE:                                                                                                                                                                                       SUBJECTIVE STATEMENT: Pt reports consistent L shoulder HEP compliance. Recently got a L knee injection and new PT referral for L knee pain. L shoulder pain 3/10 NPS.    Hand dominance: Right  PERTINENT HISTORY: Pt has PMH including: HTN, Hypothyroidism, Insomnia, Chronic L Shoulder Pain  PAIN: Are you having pain? Yes: NPRS scale: 3/10 Pain  location: L pectoral region Pain description: sharp Aggravating factors: Movement into ER/Total scaption, IR Relieving factors: Getting out of the position  PRECAUTIONS: None  WEIGHT BEARING RESTRICTIONS: No  FALLS: Has patient fallen in last 6 months? 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  UPPER EXTREMITY ROM:   Active ROM Right eval Left eval  Shoulder flexion 175 deg 156 deg  Shoulder extension    Shoulder abduction 182 deg 179 deg*  Shoulder adduction    Shoulder internal rotation 85 deg 72 deg  Shoulder external rotation 72 deg 65 deg*  Elbow flexion    Elbow extension    Wrist flexion    Wrist extension    Wrist ulnar deviation    Wrist radial deviation    Wrist pronation    Wrist supination    (Blank rows = not tested)   UPPER EXTREMITY MMT:  MMT Right eval Left eval  Shoulder flexion 4+ 4+   Shoulder extension    Shoulder abduction 4+ 3+  Shoulder adduction    Shoulder internal rotation 5 5  Shoulder external rotation 5 5  Middle trapezius    Lower trapezius    Elbow flexion    Elbow extension    Wrist flexion    Wrist extension    Wrist ulnar deviation    Wrist radial deviation    Wrist pronation    Wrist supination    Grip strength (lbs)    (Blank rows = not tested)  SHOULDER SPECIAL TESTS: Impingement tests: Neer impingement test: positive , Hawkins/Kennedy impingement test: negative, and Painful arc test: negative Rotator cuff assessment: Empty can test: positive , Full can test: positive , and Belly press test: negative Biceps assessment: Yergason's test: negative and Speed's test: positive   JOINT MOBILITY TESTING:  Pt has adequate ROM, with slight limitation in the L shoulder with abduction when comparing to the R.  Pt however does seem to have some joint restrictions in the L shoulder when performing joint mobilization.  Pt has hypomobility with AP glide and even more when doing inferior glides.`  PALPATION:  Pt has TP's noted in L pec, L supraspinatus, L infraspinatus, L subscapular region, and L biceps    TODAY'S TREATMENT: DATE: 07/12/22   There.Ex:   Seated UBE, Level 5, 3 min forward / 3 min backward, for increased endurance of the L shoulder while subjective information was gathered  Seated 2x20 AAROM shoulder flexion overhead with dowel  Standing B shoulder extension AAROM with dowel: pronated grip, 2x20  Standing behind the back 3# DB passes CCW: 3x30 sec  Standing isometric ER with RTB overhead raises on wall: 3x8  Reviewed updated HEP to progress mobility and strength in POC   PATIENT EDUCATION: Education details: Pt educated throughout session about proper posture and technique with exercises. Improved exercise technique, movement at target joints, use of target muscles after min to mod verbal, visual, tactile cues Person educated:  Patient Education method: Explanation, Demonstration, Tactile cues, Verbal cues, and Handouts Education comprehension: verbalized understanding, returned demonstration, verbal cues required, and tactile cues required    HOME EXERCISE PROGRAM: Access Code: [email or web address] URL: [email or web address] Date: 07/12/2022 Prepared by: Ronnie Derby  Exercises - Scapular Wall Slides  - 1 x daily - 7 x weekly - 3 sets - 8 reps - Single Arm Doorway Pec Stretch at 90 Degrees Abduction (Mirrored)  - 1 x daily - 7 x weekly - 3 sets - 10 reps - Standing Shoulder Flexion AAROM with Dowel  - 1 x daily - 7 x weekly - 3 sets - 10 reps - Shoulder extension with resistance - Neutral  - 1 x daily - 7 x weekly - 3 sets - 8 reps    Access Code: [email or web address] URL: [email or web address] Date: 07/07/2022 Prepared by: Ronnie Derby  Exercises - Isometric Shoulder Abduction with Newman Pies at Wall  - 1 x daily - 7 x weekly - 3 sets - 10 reps - Isometric Shoulder Flexion with Ball at Wall  - 1 x daily - 7 x weekly - 3 sets - 10 reps - Isometric Shoulder Extension with Ball at [employer]  - 1 x daily - 7 x weekly - 3 sets - 10 reps - Isometric Shoulder Adduction  - 1 x daily - 7 x weekly - 3 sets - 10 reps - Isometric Shoulder External Rotation with Ball at Wall  - 1 x daily - 7 x weekly - 3 sets - 10 reps - Standing Isometric Shoulder Internal Rotation at Wall with Ball  - 1 x daily - 7 x weekly - 3 sets - 10 reps - Scapular Wall Slides  - 1 x daily - 7 x weekly - 3 sets - 10 reps - Single Arm Doorway Pec Stretch at 90 Degrees Abduction (Mirrored)  - 1 x daily - 7 x weekly - 3 sets - 10 reps - Standing Shoulder Flexion AAROM with Dowel  - 1 x daily - 7 x weekly - 3 sets - 10 reps   Access Code: [email or web address] URL: [email or web address] Date: 06/02/2022 Prepared by: Tomasa Hose  Exercises - Isometric Shoulder Abduction with Newman Pies at Wall  - 1 x daily - 7 x weekly - 3 sets - 10 reps -  Isometric Shoulder Flexion with Ball at Wall  - 1 x daily - 7 x weekly - 3 sets - 10 reps - Isometric Shoulder Extension with Ball at [employer]  - 1 x daily - 7 x weekly - 3 sets - 10 reps - Isometric Shoulder Adduction  - 1 x daily - 7 x weekly - 3 sets - 10 reps - Isometric Shoulder External Rotation with Ball at Wall  - 1 x daily - 7 x weekly - 3 sets - 10 reps - Standing Isometric Shoulder Internal Rotation at Wall with Ball  - 1 x daily - 7 x weekly - 3 sets - 10 reps    ASSESSMENT:  CLINICAL IMPRESSION: Continuing PT POC with L shoulder mobility and strength. Updated HEP to address lower trap and scapular upward rotation strengthening and overhead/behind back mobility for ADL completion. Pt making good progress with improved AROM in L shoulder in all planes and reduced pain. Encouraged pt to continue updated HEP during 2 week vacation and report symptom relief upon return to PT. Pt will continue to benefit from skilled therapy to address remaining deficits in order to improve overall QoL and return to PLOF.  OBJECTIVE IMPAIRMENTS: decreased activity tolerance, decreased ROM, decreased strength, hypomobility, obesity, and pain.   ACTIVITY LIMITATIONS: lifting, sleeping, and dressing  PARTICIPATION LIMITATIONS: occupation and church  PERSONAL FACTORS: Age, Fitness, Past/current experiences, Time since onset of injury/illness/exacerbation, and 1-2 comorbidities: HTN, insomnia  are also affecting patient's functional outcome.   REHAB POTENTIAL: Good  CLINICAL DECISION MAKING: Stable/uncomplicated  EVALUATION COMPLEXITY: Low   GOALS: Goals reviewed with patient? Yes  SHORT TERM GOALS: Target date: 06/30/2022  Pt will be independent with HEP in order to demonstrate increased ability to perform tasks related to occupation/hobbies. Baseline: Pt given HEP at initial evaluation for isometrics Goal status: INITIAL   LONG TERM GOALS: Target date: 08/25/2022  Patient will demonstrate  improved function as evidenced by a score of 77 on FOTO measure for full participation in activities at home and in the community. Baseline: 74 Goal status: INITIAL  2.  Patient will demonstrate adequate shoulder ROM and strength to be able to don bra and dress independently with pain less than 3/10. Baseline: Pt reports 4/10 pain with IR Goal status: INITIAL  3.  Patient will increase L UE gross strength to 5/5 as to improve functional strength for activity tolerance needed for piano playing and increased ADL ability. Baseline: Pt demonstrates 4/5 gross strength and noted weakness with piano playing. Goal status: INITIAL  PLAN:  PT FREQUENCY: 2x/week  PT DURATION: 12 weeks  PLANNED INTERVENTIONS: Therapeutic exercises, Therapeutic activity, Neuromuscular re-education, Balance training, Gait training, Patient/Family education, Self Care, Joint mobilization, Joint manipulation, Vestibular training, Canalith repositioning, Aquatic Therapy, Dry Needling, Spinal manipulation, Spinal mobilization, Cryotherapy, Moist heat, Taping, Ultrasound, Manual therapy, and Re-evaluation  PLAN FOR NEXT SESSION: Continue with strengthening of the L shoulder within pain free range.   Delphia Grates. Fairly IV, PT, DPT Physical Therapist- [facility]   07/12/22, 12:26 PM

## 2022-07-26 DIAGNOSIS — R300 Dysuria: Secondary | ICD-10-CM | POA: Diagnosis not present

## 2022-07-26 DIAGNOSIS — B9620 Unspecified Escherichia coli [E. coli] as the cause of diseases classified elsewhere: Secondary | ICD-10-CM | POA: Diagnosis not present

## 2022-07-26 DIAGNOSIS — N390 Urinary tract infection, site not specified: Secondary | ICD-10-CM | POA: Diagnosis not present

## 2022-08-02 ENCOUNTER — Ambulatory Visit: Payer: Medicare PPO | Attending: Family Medicine

## 2022-08-02 DIAGNOSIS — M25512 Pain in left shoulder: Secondary | ICD-10-CM | POA: Insufficient documentation

## 2022-08-02 DIAGNOSIS — G8929 Other chronic pain: Secondary | ICD-10-CM | POA: Insufficient documentation

## 2022-08-02 DIAGNOSIS — M25612 Stiffness of left shoulder, not elsewhere classified: Secondary | ICD-10-CM | POA: Diagnosis not present

## 2022-08-02 NOTE — Therapy (Signed)
OUTPATIENT PHYSICAL THERAPY SHOULDER TREATMENT   Patient Name: Erica Pineda MRN: [id number] DOB:16-Jul-1950, 72 y.o., female Today's Date: 08/02/2022  END OF SESSION:  PT End of Session - 08/02/22 1041     Visit Number 7    Number of Visits 17    Date for PT Re-Evaluation 08/25/22    Authorization Type [place]    Authorization - Visit Number 7    Authorization - Number of Visits 17    Progress Note Due on Visit 10    PT Start Time 1035    PT Stop Time 1115    PT Time Calculation (min) 40 min    Activity Tolerance Patient tolerated treatment well    Behavior During Therapy WFL for tasks assessed/performed             Past Medical History:  Diagnosis Date   Cancer (HCC)    Thyroid-follwed by Endocrine   Hyperlipidemia    Past Surgical History:  Procedure Laterality Date   ABDOMINAL HYSTERECTOMY     APPENDECTOMY  1996   BUNIONECTOMY  05/2007   and hammer-toe   CATARACT EXTRACTION Bilateral    CYSTOCELE REPAIR  1989   HERNIA REPAIR Left 1993   HYMENECTOMY  1971   TONSILLECTOMY AND ADENOIDECTOMY  1972   TOTAL THYROIDECTOMY  02/08/2009   Patient Active Problem List   Diagnosis Date Noted   Insomnia 05/02/2022   Chronic left shoulder pain 05/02/2022   Hypertension associated with diabetes (HCC) 08/23/2021   Morbid obesity (HCC) 08/22/2021   T2DM (type 2 diabetes mellitus) (HCC) 08/22/2021   Stress at home 08/22/2021   Family history of breast cancer 08/10/2016   Adult hypothyroidism 06/11/2014   Avitaminosis D 06/11/2014   Hyperlipidemia associated with type 2 diabetes mellitus (HCC) 05/07/2006    PCP: Erasmo Downer, MD  REFERRING PROVIDER: Erasmo Downer, MD  REFERRING DIAG: [phone] (ICD-10-CM) - Chronic left shoulder pain   THERAPY DIAG:  Chronic left shoulder pain  Decreased ROM of left shoulder  Rationale for Evaluation and Treatment: Rehabilitation  ONSET DATE: Several years  SUBJECTIVE:                                                                                                                                                                                       SUBJECTIVE STATEMENT: Pt reports mild pain in L shoulder. Worst pain over vacation primiarly with excessive traveling was 3/10 NPS. Reports maybe 1/10 pain today.    Hand dominance: Right  PERTINENT HISTORY: Pt has PMH including: HTN, Hypothyroidism, Insomnia, Chronic L Shoulder Pain  PAIN: Are you having pain? Yes: NPRS scale: 1/10 Pain location: L pectoral  region Pain description: sharp Aggravating factors: Movement into ER/Total scaption, IR Relieving factors: Getting out of the position  PRECAUTIONS: None  WEIGHT BEARING RESTRICTIONS: No  FALLS: Has patient fallen in last 6 months? 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  UPPER EXTREMITY ROM:   Active ROM Right eval Left eval  Shoulder flexion 175 deg 156 deg  Shoulder extension    Shoulder abduction 182 deg 179 deg*  Shoulder adduction    Shoulder internal rotation 85 deg 72 deg  Shoulder external rotation 72 deg 65 deg*  Elbow flexion    Elbow extension    Wrist flexion    Wrist extension    Wrist ulnar deviation    Wrist radial deviation    Wrist pronation    Wrist supination    (Blank rows = not tested)   UPPER EXTREMITY MMT:  MMT Right eval Left eval  Shoulder flexion 4+ 4+   Shoulder extension    Shoulder abduction 4+ 3+  Shoulder adduction    Shoulder internal rotation 5 5  Shoulder external rotation 5 5  Middle trapezius    Lower trapezius    Elbow flexion    Elbow extension    Wrist flexion    Wrist extension    Wrist ulnar deviation    Wrist radial deviation    Wrist pronation    Wrist supination    Grip strength (lbs)    (Blank rows = not tested)  SHOULDER SPECIAL TESTS: Impingement tests: Neer impingement test: positive , Hawkins/Kennedy impingement test: negative, and Painful arc test: negative Rotator cuff assessment: Empty can test: positive , Full can test: positive , and Belly press test: negative Biceps assessment: Yergason's test: negative and Speed's test: positive   JOINT MOBILITY TESTING:  Pt has adequate ROM, with slight limitation in the L shoulder with abduction when comparing to the R.  Pt however does seem to have some joint restrictions in the L shoulder when performing joint mobilization.  Pt has hypomobility with AP glide and even more when doing inferior glides.`  PALPATION:  Pt has TP's noted in L pec, L supraspinatus, L infraspinatus, L subscapular region, and L biceps    TODAY'S TREATMENT: DATE: 08/02/22   There.Ex:  Standing isometric ER with RTB overhead raises on wall: 3x8. Min VC's for adjusting form/technique.   Standing B shoulder extension/low row with blue TB: 3x8. Educated on home performance.  Standing behind the back 4# DB passes CCW: 3x30 sec  Standing with L shoulder abducted to 90 deg: shoulder ER/IR with RTB: 3x8/side  Seated 2x20 AAROM shoulder flexion overhead with dowel 4# AW    PATIENT EDUCATION: Education details: Pt educated throughout session about proper posture and technique with exercises. Improved exercise technique, movement at target joints, use of target muscles after min to mod verbal, visual, tactile cues Person educated: Patient Education method: Explanation, Demonstration,  Tactile cues, Verbal cues, and Handouts Education  comprehension: verbalized understanding, returned demonstration, verbal cues required, and tactile cues required    HOME EXERCISE PROGRAM: Access Code: [email or web address] URL: [email or web address] Date: 07/12/2022 Prepared by: Ronnie Derby  Exercises - Scapular Wall Slides  - 1 x daily - 7 x weekly - 3 sets - 8 reps - Single Arm Doorway Pec Stretch at 90 Degrees Abduction (Mirrored)  - 1 x daily - 7 x weekly - 3 sets - 10 reps - Standing Shoulder Flexion AAROM with Dowel  - 1 x daily - 7 x weekly - 3 sets - 10 reps - Shoulder extension with resistance - Neutral  - 1 x daily - 7 x weekly - 3 sets - 8 reps    Access Code: [email or web address] URL: [email or web address] Date: 07/07/2022 Prepared by: Ronnie Derby  Exercises - Isometric Shoulder Abduction with [employer] P[employer] at Wall  - 1 x daily - 7 x weekly - 3 sets - 10 reps - Isometric Shoulder Flexion with Ball at Wall  - 1 x daily - 7 x weekly - 3 sets - 10 reps - Isometric Shoulder Extension with Ball at [employer]  - 1 x daily - 7 x weekly - 3 sets - 10 reps - Isometric Shoulder Adduction  - 1 x daily - 7 x weekly - 3 sets - 10 reps - Isometric Shoulder External Rotation with Ball at Wall  - 1 x daily - 7 x weekly - 3 sets - 10 reps - Standing Isometric Shoulder Internal Rotation at Wall with Ball  - 1 x daily - 7 x weekly - 3 sets - 10 reps - Scapular Wall Slides  - 1 x daily - 7 x weekly - 3 sets - 10 reps - Single Arm Doorway Pec Stretch at 90 Degrees Abduction (Mirrored)  - 1 x daily - 7 x weekly - 3 sets - 10 reps - Standing Shoulder Flexion AAROM with Dowel  - 1 x daily - 7 x weekly - 3 sets - 10 reps   Access Code: [email or web address] URL: [email or web address] Date: 06/02/2022 Prepared by: Tomasa Hose  Exercises - Isometric Shoulder Abduction with Newman P[employer] at Wall  - 1 x daily - 7 x weekly - 3 sets - 10 reps - Isometric Shoulder Flexion with Ball at Wall  - 1 x daily - 7  x weekly - 3 sets - 10 reps - Isometric Shoulder Extension with Ball at [employer]  - 1 x daily - 7 x weekly - 3 sets - 10 reps - Isometric Shoulder Adduction  - 1 x daily - 7 x weekly - 3 sets - 10 reps - Isometric Shoulder External Rotation with Ball at Wall  - 1 x daily - 7 x weekly - 3 sets - 10 reps - Standing Isometric Shoulder Internal Rotation at Wall with Ball  - 1 x daily - 7 x weekly - 3 sets - 10 reps    ASSESSMENT:  CLINICAL IMPRESSION: Pt arriving with consistently lowered pain levels over 2 weeks hiatus due to vacation. Reviewed updated HEP for home compliance and continuing with progressive RTC strengthening and end range GHJ mobility. Pt with excellent understanding of exercises. No pain worsening with progressive loading today. Pt likely close to discharge in the coming visits for shoulder POC. Pt will continue to benefit from skilled therapy to address remaining deficits in order to improve overall QoL and return to PLOF.     OBJECTIVE IMPAIRMENTS: decreased activity tolerance, decreased ROM, decreased strength, hypomobility, obesity, and pain.   ACTIVITY LIMITATIONS: lifting,  sleeping, and dressing  PARTICIPATION LIMITATIONS: occupation and church  PERSONAL FACTORS: Age, Fitness, Past/current experiences, Time since onset of injury/illness/exacerbation, and 1-2 comorbidities: HTN, insomnia  are also affecting patient's functional outcome.   REHAB POTENTIAL: Good  CLINICAL DECISION MAKING: Stable/uncomplicated  EVALUATION COMPLEXITY: Low   GOALS: Goals reviewed with patient? Yes  SHORT TERM GOALS: Target date: 06/30/2022  Pt will be independent with HEP in order to demonstrate increased ability to perform tasks related to occupation/hobbies. Baseline: Pt given HEP at initial evaluation for isometrics Goal status: INITIAL   LONG TERM GOALS: Target date: 08/25/2022  Patient will demonstrate improved function as evidenced by a score of 77 on FOTO measure for full  participation in activities at home and in the community. Baseline: 74 Goal status: INITIAL  2.  Patient will demonstrate adequate shoulder ROM and strength to be able to don bra and dress independently with pain less than 3/10. Baseline: Pt reports 4/10 pain with IR Goal status: INITIAL  3.  Patient will increase L UE gross strength to 5/5 as to improve functional strength for activity tolerance needed for piano playing and increased ADL ability. Baseline: Pt demonstrates 4/5 gross strength and noted weakness with piano playing. Goal status: INITIAL  PLAN:  PT FREQUENCY: 2x/week  PT DURATION: 12 weeks  PLANNED INTERVENTIONS: Therapeutic exercises, Therapeutic activity, Neuromuscular re-education, Balance training, Gait training, Patient/Family education, Self Care, Joint mobilization, Joint manipulation, Vestibular training, Canalith repositioning, Aquatic Therapy, Dry Needling, Spinal manipulation, Spinal mobilization, Cryotherapy, Moist heat, Taping, Ultrasound, Manual therapy, and Re-evaluation  PLAN FOR NEXT SESSION: Continue with strengthening of the L shoulder within pain free range.   Delphia Grates. Fairly IV, PT, DPT Physical Therapist- [place]  [facility]   08/02/22, 11:28 AM

## 2022-08-04 ENCOUNTER — Ambulatory Visit: Payer: Medicare PPO

## 2022-08-04 DIAGNOSIS — M25512 Pain in left shoulder: Secondary | ICD-10-CM | POA: Diagnosis not present

## 2022-08-04 DIAGNOSIS — M25612 Stiffness of left shoulder, not elsewhere classified: Secondary | ICD-10-CM

## 2022-08-04 DIAGNOSIS — G8929 Other chronic pain: Secondary | ICD-10-CM

## 2022-08-04 NOTE — Therapy (Signed)
OUTPATIENT PHYSICAL THERAPY SHOULDER TREATMENT   Patient Name: Erica Pineda MRN: [id number] DOB:1950-08-05, 72 y.o., female Today's Date: 08/04/2022  END OF SESSION:  PT End of Session - 08/04/22 1035     Visit Number 8    Number of Visits 17    Date for PT Re-Evaluation 08/25/22    Authorization Type [place]    Authorization - Visit Number 8    Authorization - Number of Visits 17    Progress Note Due on Visit 10    PT Start Time 1033    PT Stop Time 1114    PT Time Calculation (min) 41 min    Activity Tolerance Patient tolerated treatment well    Behavior During Therapy WFL for tasks assessed/performed             Past Medical History:  Diagnosis Date   Cancer (HCC)    Thyroid-follwed by Endocrine   Hyperlipidemia    Past Surgical History:  Procedure Laterality Date   ABDOMINAL HYSTERECTOMY     APPENDECTOMY  1996   BUNIONECTOMY  05/2007   and hammer-toe   CATARACT EXTRACTION Bilateral    CYSTOCELE REPAIR  1989   HERNIA REPAIR Left 1993   HYMENECTOMY  1971   TONSILLECTOMY AND ADENOIDECTOMY  1972   TOTAL THYROIDECTOMY  02/08/2009   Patient Active Problem List   Diagnosis Date Noted   Insomnia 05/02/2022   Chronic left shoulder pain 05/02/2022   Hypertension associated with diabetes (HCC) 08/23/2021   Morbid obesity (HCC) 08/22/2021   T2DM (type 2 diabetes mellitus) (HCC) 08/22/2021   Stress at home 08/22/2021   Family history of breast cancer 08/10/2016   Adult hypothyroidism 06/11/2014   Avitaminosis D 06/11/2014   Hyperlipidemia associated with type 2 diabetes mellitus (HCC) 05/07/2006    PCP: Erasmo Downer, MD  REFERRING PROVIDER: Erasmo Downer, MD  REFERRING DIAG: [phone] (ICD-10-CM) - Chronic left shoulder pain   THERAPY DIAG:  Chronic left shoulder pain  Decreased ROM of left shoulder  Rationale for Evaluation and Treatment: Rehabilitation  ONSET DATE: Several years  SUBJECTIVE:                                                                                                                                                                                       SUBJECTIVE STATEMENT: Pt reports soreness in L shoulder after 2 week hiatus from PT.    Hand dominance: Right  PERTINENT HISTORY: Pt has PMH including: HTN, Hypothyroidism, Insomnia, Chronic L Shoulder Pain  PAIN: Are you having pain? Yes: NPRS scale: 1/10 Pain location: L pectoral region Pain description: sharp Aggravating factors: Movement into ER/Total scaption, IR  Relieving factors: Getting out of the position  PRECAUTIONS: None  WEIGHT BEARING RESTRICTIONS: No  FALLS: Has patient fallen in last 6 months? 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  UPPER EXTREMITY ROM:   Active ROM Right eval Left eval  Shoulder flexion 175 deg 156 deg  Shoulder extension    Shoulder abduction 182 deg 179 deg*  Shoulder adduction    Shoulder internal rotation 85 deg 72 deg  Shoulder external rotation 72 deg 65 deg*  Elbow flexion    Elbow extension    Wrist flexion    Wrist extension    Wrist ulnar deviation    Wrist radial deviation    Wrist pronation    Wrist supination    (Blank rows = not tested)   UPPER EXTREMITY MMT:  MMT Right eval Left eval  Shoulder flexion 4+ 4+  Shoulder extension    Shoulder abduction 4+ 3+  Shoulder adduction    Shoulder  internal rotation 5 5  Shoulder external rotation 5 5  Middle trapezius    Lower trapezius    Elbow flexion    Elbow extension    Wrist flexion    Wrist extension    Wrist ulnar deviation    Wrist radial deviation    Wrist pronation    Wrist supination    Grip strength (lbs)    (Blank rows = not tested)  SHOULDER SPECIAL TESTS: Impingement tests: Neer impingement test: positive , Hawkins/Kennedy impingement test: negative, and Painful arc test: negative Rotator cuff assessment: Empty can test: positive , Full can test: positive , and Belly press test: negative Biceps assessment: Yergason's test: negative and Speed's test: positive   JOINT MOBILITY TESTING:  Pt has adequate ROM, with slight limitation in the L shoulder with abduction when comparing to the R.  Pt however does seem to have some joint restrictions in the L shoulder when performing joint mobilization.  Pt has hypomobility with AP glide and even more when doing inferior glides.`  PALPATION:  Pt has TP's noted in L pec, L supraspinatus, L infraspinatus, L subscapular region, and L biceps    TODAY'S TREATMENT: DATE: 08/04/22   There.Ex: UBE: L4 3 min forward, 3 min backward for shoulder and periscapular warm up.   Standing isometric ER with RTB overhead raises on wall: 3x8. Min VC's for adjusting form/technique.   Standing B shoulder extension/low row with blue TB: 3x8. Educated on home performance.  Seated blue TB T row: 3x6, PT demo prior to completion.   Reviewed standing and seated variations of RTC strengthening with shoulder abducted to 90 degrees   Time spent today reviewing updated program as pt has questions on her form/technique.    PATIENT EDUCATION: Education details: Pt educated throughout session about proper posture and technique with exercises. Improved exercise technique, movement at target joints, use of target muscles after min to mod verbal, visual, tactile cues Person educated:  Patient  Education method: Explanation, Demonstration, Tactile cues, Verbal cues, and Handouts Education comprehension: verbalized understanding, returned demonstration, verbal cues required, and tactile cues required    HOME EXERCISE PROGRAM: Access Code: [email or web address] URL: [email or web address] Date: 08/04/2022 Prepared by: Ronnie Derby  Exercises - Scapular Wall Slides  - 1 x daily - 7 x weekly - 3 sets - 8 reps - Single Arm Doorway Pec Stretch at 90 Degrees Abduction (Mirrored)  - 1 x daily - 7 x weekly - 3 sets - 10 reps - Standing Shoulder Flexion AAROM with Dowel  - 1 x daily - 7 x weekly - 3 sets - 10 reps - Shoulder extension with resistance - Neutral  - 1 x daily - 3-4 x weekly - 3 sets - 8 reps - Standing Row with Resistance with Anchored Resistance at Chest Height Palms Down  - 1 x daily - 3-4 x weekly - 3 sets - 8 reps   Access Code: [email or web address] URL: [email or web address] Date: 07/12/2022 Prepared by: Ronnie Derby  Exercises - Scapular Wall Slides  - 1 x daily - 7 x weekly - 3 sets - 8 reps - Single Arm Doorway Pec Stretch at 90 Degrees Abduction (Mirrored)  - 1 x daily - 7 x weekly - 3 sets - 10 reps - Standing Shoulder Flexion AAROM with Dowel  - 1 x daily - 7 x weekly - 3 sets - 10 reps - Shoulder extension with resistance - Neutral  - 1 x daily - 7 x weekly - 3 sets - 8 reps    Access Code: [email or web address] URL: [email or web address] Date: 07/07/2022 Prepared by: Ronnie Derby  Exercises - Isometric Shoulder Abduction with Newman Pies at Wall  - 1 x daily - 7 x weekly - 3 sets - 10 reps - Isometric Shoulder Flexion with Ball at Wall  - 1 x daily - 7 x weekly - 3 sets - 10 reps - Isometric Shoulder Extension with Ball at [employer]  - 1 x daily - 7 x weekly - 3 sets - 10 reps - Isometric Shoulder Adduction  - 1 x daily - 7 x weekly - 3 sets - 10 reps - Isometric Shoulder External Rotation with Ball at Wall  - 1 x daily - 7 x weekly - 3 sets -  10 reps - Standing Isometric Shoulder Internal Rotation at Wall with Ball  - 1 x daily - 7 x weekly - 3 sets - 10 reps - Scapular Wall Slides  - 1 x daily - 7 x weekly - 3 sets - 10 reps - Single Arm Doorway Pec Stretch at 90 Degrees Abduction (Mirrored)  - 1 x daily - 7 x weekly - 3 sets - 10 reps - Standing Shoulder Flexion AAROM with Dowel  - 1 x daily - 7 x weekly - 3 sets - 10 reps   Access Code: [email or web address] URL: [email or web address] Date: 06/02/2022 Prepared by: Tomasa Hose  Exercises - Isometric Shoulder Abduction with Newman Pies at Wall  - 1 x daily - 7 x weekly - 3 sets - 10 reps - Isometric Shoulder Flexion with Ball at Wall  - 1 x daily - 7 x weekly - 3 sets - 10 reps - Isometric Shoulder Extension with Ball at [employer]  - 1 x daily - 7 x weekly - 3 sets - 10 reps - Isometric Shoulder Adduction  - 1 x daily - 7 x weekly - 3 sets - 10 reps - Isometric Shoulder External Rotation with Ball at Wall  - 1 x  daily - 7 x weekly - 3 sets - 10 reps - Standing Isometric Shoulder Internal Rotation at Wall with Ball  - 1 x daily - 7 x weekly - 3 sets - 10 reps    ASSESSMENT:  CLINICAL IMPRESSION: Focus of session on improving L shoulder strength and mobility. Time spent today reviewing updated program and reviewing form/technique with progressive exercises. Pt overall with good form/technique after PT demo and min multimodal cuing. Educated on DOMS and importance of therapeutic rest to allow muscle recovery with strength exercises. Pt understanding. Pt will continue to benefit from skilled therapy to address remaining deficits in order to improve overall QoL and return to PLOF.     OBJECTIVE IMPAIRMENTS: decreased activity tolerance, decreased ROM, decreased strength, hypomobility, obesity, and pain.   ACTIVITY LIMITATIONS: lifting, sleeping, and dressing  PARTICIPATION LIMITATIONS: occupation and church  PERSONAL FACTORS: Age, Fitness, Past/current experiences, Time since onset of  injury/illness/exacerbation, and 1-2 comorbidities: HTN, insomnia  are also affecting patient's functional outcome.   REHAB POTENTIAL: Good  CLINICAL DECISION MAKING: Stable/uncomplicated  EVALUATION COMPLEXITY: Low   GOALS: Goals reviewed with patient? Yes  SHORT TERM GOALS: Target date: 06/30/2022  Pt will be independent with HEP in order to demonstrate increased ability to perform tasks related to occupation/hobbies. Baseline: Pt given HEP at initial evaluation for isometrics Goal status: INITIAL   LONG TERM GOALS: Target date: 08/25/2022  Patient will demonstrate improved function as evidenced by a score of 77 on FOTO measure for full participation in activities at home and in the community. Baseline: 74 Goal status: INITIAL  2.  Patient will demonstrate adequate shoulder ROM and strength to be able to don bra and dress independently with pain less than 3/10. Baseline: Pt reports 4/10 pain with IR Goal status: INITIAL  3.  Patient will increase L UE gross strength to 5/5 as to improve functional strength for activity tolerance needed for piano playing and increased ADL ability. Baseline: Pt demonstrates 4/5 gross strength and noted weakness with piano playing. Goal status: INITIAL  PLAN:  PT FREQUENCY: 2x/week  PT DURATION: 12 weeks  PLANNED INTERVENTIONS: Therapeutic exercises, Therapeutic activity, Neuromuscular re-education, Balance training, Gait training, Patient/Family education, Self Care, Joint mobilization, Joint manipulation, Vestibular training, Canalith repositioning, Aquatic Therapy, Dry Needling, Spinal manipulation, Spinal mobilization, Cryotherapy, Moist heat, Taping, Ultrasound, Manual therapy, and Re-evaluation  PLAN FOR NEXT SESSION: Continue with strengthening of the L shoulder within pain free range.   Delphia Grates. Fairly IV, PT, DPT Physical Therapist- [place]  [facility]   08/04/22, 11:15 AM

## 2022-08-07 ENCOUNTER — Ambulatory Visit: Payer: Medicare PPO

## 2022-08-07 DIAGNOSIS — G8929 Other chronic pain: Secondary | ICD-10-CM

## 2022-08-07 DIAGNOSIS — M25612 Stiffness of left shoulder, not elsewhere classified: Secondary | ICD-10-CM | POA: Diagnosis not present

## 2022-08-07 DIAGNOSIS — M25512 Pain in left shoulder: Secondary | ICD-10-CM | POA: Diagnosis not present

## 2022-08-07 NOTE — Therapy (Signed)
OUTPATIENT PHYSICAL THERAPY SHOULDER TREATMENT   Patient Name: Erica Pineda MRN: [id number] DOB:[birth year], 72 y.o., female Today's Date: 08/07/2022  END OF SESSION:  PT End of Session - 08/07/22 0900     Visit Number 9    Number of Visits 17    Date for PT Re-Evaluation 08/25/22    Authorization [place]    Authorization - Visit Number 9    Authorization - Number of Visits 17    Progress Note Due on Visit 10    PT Start Time 0901    PT Stop Time 0945    PT Time Calculation (min) 44 min    Activity Tolerance Patient tolerated treatment well    Behavior During Therapy WFL for tasks assessed/performed             Past Medical History:  Diagnosis Date   Cancer (HCC)    Thyroid-follwed by Endocrine   Hyperlipidemia    Past Surgical History:  Procedure Laterality Date   ABDOMINAL HYSTERECTOMY     APPENDECTOMY  1996   BUNIONECTOMY  05/2007   and hammer-toe   CATARACT EXTRACTION Bilateral    CYSTOCELE REPAIR  1989   HERNIA REPAIR Left 1993   HYMENECTOMY  1971   TONSILLECTOMY AND ADENOIDECTOMY  1972   TOTAL THYROIDECTOMY  02/08/2009   Patient Active Problem List   Diagnosis Date Noted   Insomnia 05/02/2022   Chronic left shoulder pain 05/02/2022   Hypertension associated with diabetes (HCC) 08/23/2021   Morbid obesity (HCC) 08/22/2021   T2DM (type 2 diabetes mellitus) (HCC) 08/22/2021   Stress at home 08/22/2021   Family history of breast cancer 08/10/2016   Adult hypothyroidism 06/11/2014   Avitaminosis D 06/11/2014   Hyperlipidemia associated with type 2 diabetes mellitus (HCC) 05/07/2006    PCP: Erasmo Downer, MD  REFERRING PROVIDER: Erasmo Downer, MD  REFERRING DIAG: [phone] (ICD-10-CM) - Chronic left shoulder pain   THERAPY DIAG:  Chronic left shoulder pain  Decreased ROM of left shoulder  Rationale for Evaluation and Treatment: Rehabilitation  ONSET DATE: Several years  SUBJECTIVE:                                                                                                                                                                                       SUBJECTIVE STATEMENT: Pt reports no pain today. Able to rest over the weekend. Reports better understanding of HEP from last visit review.    Hand dominance: Right  PERTINENT HISTORY: Pt has PMH including: HTN, Hypothyroidism, Insomnia, Chronic L Shoulder Pain  PAIN: Are you having pain? Yes: NPRS scale: 0/10 Pain location: L pectoral region Pain description:  sharp Aggravating factors: Movement into ER/Total scaption, IR Relieving factors: Getting out of the position  PRECAUTIONS: None  WEIGHT BEARING RESTRICTIONS: No  FALLS: Has patient fallen in last 6 months? 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  UPPER EXTREMITY ROM:   Active ROM Right eval Left eval  Shoulder flexion 175 deg 156 deg  Shoulder extension    Shoulder abduction 182 deg 179 deg*  Shoulder adduction    Shoulder internal rotation 85 deg 72 deg  Shoulder external rotation 72 deg 65 deg*  Elbow flexion    Elbow extension    Wrist flexion    Wrist extension    Wrist ulnar deviation    Wrist radial deviation    Wrist pronation    Wrist supination    (Blank rows = not tested)   UPPER EXTREMITY MMT:  MMT Right eval Left eval  Shoulder flexion 4+ 4+  Shoulder extension     Shoulder abduction 4+ 3+  Shoulder adduction    Shoulder internal rotation 5 5  Shoulder external rotation 5 5  Middle trapezius    Lower trapezius    Elbow flexion    Elbow extension    Wrist flexion    Wrist extension    Wrist ulnar deviation    Wrist radial deviation    Wrist pronation    Wrist supination    Grip strength (lbs)    (Blank rows = not tested)  SHOULDER SPECIAL TESTS: Impingement tests: Neer impingement test: positive , Hawkins/Kennedy impingement test: negative, and Painful arc test: negative Rotator cuff assessment: Empty can test: positive , Full can test: positive , and Belly press test: negative Biceps assessment: Yergason's test: negative and Speed's test: positive   JOINT MOBILITY TESTING:  Pt has adequate ROM, with slight limitation in the L shoulder with abduction when comparing to the R.  Pt however does seem to have some joint restrictions in the L shoulder when performing joint mobilization.  Pt has hypomobility with AP glide and even more when doing inferior glides.`  PALPATION:  Pt has TP's noted in L pec, L supraspinatus, L infraspinatus, L subscapular region, and L biceps    TODAY'S TREATMENT: DATE: 08/07/22   There.Ex: UBE: L5 3 min forward, 3 min backward for shoulder and periscapular warm up.   Seated L shoulder ER/IR with arm resting at 90 deg abduction at shoulder propped on big, red bolster. RTB, 2x8/direction  OMEGA exercises:   Lat pull down: 3x6, 45#   Scap retractions: 3x6, 45#  Standing B shoulder AAROM shoulder flexion and thoracic extension with dowel: 4# AW, 3x12  Standing B shoulder extension AAROM with dowel: 3x12, 4# AW  Standing isometric ER with RTB overhead raises on wall: 2x12. Min VC's for adjusting form/technique.      PATIENT EDUCATION: Education details: Pt educated throughout session about proper posture and technique with exercises. Improved exercise technique, movement at target joints, use  of target  muscles after min to mod verbal, visual, tactile cues Person educated: Patient Education method: Explanation, Demonstration, Tactile cues, Verbal cues, and Handouts Education comprehension: verbalized understanding, returned demonstration, verbal cues required, and tactile cues required    HOME EXERCISE PROGRAM: Access Code: [email or web address] URL: [email or web address] Date: 08/04/2022 Prepared by: Ronnie Derby  Exercises - Scapular Wall Slides  - 1 x daily - 7 x weekly - 3 sets - 8 reps - Single Arm Doorway Pec Stretch at 90 Degrees Abduction (Mirrored)  - 1 x daily - 7 x weekly - 3 sets - 10 reps - Standing Shoulder Flexion AAROM with Dowel  - 1 x daily - 7 x weekly - 3 sets - 10 reps - Shoulder extension with resistance - Neutral  - 1 x daily - 3-4 x weekly - 3 sets - 8 reps - Standing Row with Resistance with Anchored Resistance at Chest Height Palms Down  - 1 x daily - 3-4 x weekly - 3 sets - 8 reps   Access Code: [email or web address] URL: [email or web address] Date: 07/12/2022 Prepared by: Ronnie Derby  Exercises - Scapular Wall Slides  - 1 x daily - 7 x weekly - 3 sets - 8 reps - Single Arm Doorway Pec Stretch at 90 Degrees Abduction (Mirrored)  - 1 x daily - 7 x weekly - 3 sets - 10 reps - Standing Shoulder Flexion AAROM with Dowel  - 1 x daily - 7 x weekly - 3 sets - 10 reps - Shoulder extension with resistance - Neutral  - 1 x daily - 7 x weekly - 3 sets - 8 reps    Access Code: [email or web address] URL: [email or web address] Date: 07/07/2022 Prepared by: Ronnie Derby  Exercises - Isometric Shoulder Abduction with Newman Pies at Wall  - 1 x daily - 7 x weekly - 3 sets - 10 reps - Isometric Shoulder Flexion with Ball at Wall  - 1 x daily - 7 x weekly - 3 sets - 10 reps - Isometric Shoulder Extension with Ball at [employer]  - 1 x daily - 7 x weekly - 3 sets - 10 reps - Isometric Shoulder Adduction  - 1 x daily - 7 x weekly - 3 sets - 10 reps - Isometric Shoulder  External Rotation with Ball at Wall  - 1 x daily - 7 x weekly - 3 sets - 10 reps - Standing Isometric Shoulder Internal Rotation at Wall with Ball  - 1 x daily - 7 x weekly - 3 sets - 10 reps - Scapular Wall Slides  - 1 x daily - 7 x weekly - 3 sets - 10 reps - Single Arm Doorway Pec Stretch at 90 Degrees Abduction (Mirrored)  - 1 x daily - 7 x weekly - 3 sets - 10 reps - Standing Shoulder Flexion AAROM with Dowel  - 1 x daily - 7 x weekly - 3 sets - 10 reps   Access Code: [email or web address] URL: [email or web address] Date: 06/02/2022 Prepared by: Tomasa Hose  Exercises - Isometric Shoulder Abduction with Newman Pies at Wall  - 1 x daily - 7 x weekly - 3 sets - 10 reps - Isometric Shoulder Flexion with Ball at Wall  - 1 x daily - 7 x weekly - 3 sets - 10 reps - Isometric Shoulder Extension with Ball at Wall  - 1 x daily - 7 x weekly - 3 sets - 10 reps - Isometric Shoulder Adduction  - 1 x daily - 7 x weekly - 3 sets -  10 reps - Isometric Shoulder External Rotation with Ball at [employer]  - 1 x daily - 7 x weekly - 3 sets - 10 reps - Standing Isometric Shoulder Internal Rotation at Wall with Ball  - 1 x daily - 7 x weekly - 3 sets - 10 reps    ASSESSMENT:  CLINICAL IMPRESSION: Pt tolerating progression of L shoulder mobility and strengthening in overhead positions without pain. Pt does report increased weakness and some discomfort throughout but no issues besides that. Continuing to educate on rest days post PT with progressive loading. Pt will require progress note next session to assess progress in POC. Pt will continue to benefit from skilled therapy to address remaining deficits in order to improve overall QoL and return to PLOF.     OBJECTIVE IMPAIRMENTS: decreased activity tolerance, decreased ROM, decreased strength, hypomobility, obesity, and pain.   ACTIVITY LIMITATIONS: lifting, sleeping, and dressing  PARTICIPATION LIMITATIONS: occupation and church  PERSONAL FACTORS: Age,  Fitness, Past/current experiences, Time since onset of injury/illness/exacerbation, and 1-2 comorbidities: HTN, insomnia  are also affecting patient's functional outcome.   REHAB POTENTIAL: Good  CLINICAL DECISION MAKING: Stable/uncomplicated  EVALUATION COMPLEXITY: Low   GOALS: Goals reviewed with patient? Yes  SHORT TERM GOALS: Target date: 06/30/2022  Pt will be independent with HEP in order to demonstrate increased ability to perform tasks related to occupation/hobbies. Baseline: Pt given HEP at initial evaluation for isometrics Goal status: INITIAL   LONG TERM GOALS: Target date: 08/25/2022  Patient will demonstrate improved function as evidenced by a score of 77 on FOTO measure for full participation in activities at home and in the community. Baseline: 74 Goal status: INITIAL  2.  Patient will demonstrate adequate shoulder ROM and strength to be able to don bra and dress independently with pain less than 3/10. Baseline: Pt reports 4/10 pain with IR Goal status: INITIAL  3.  Patient will increase L UE gross strength to 5/5 as to improve functional strength for activity tolerance needed for piano playing and increased ADL ability. Baseline: Pt demonstrates 4/5 gross strength and noted weakness with piano playing. Goal status: INITIAL  PLAN:  PT FREQUENCY: 2x/week  PT DURATION: 12 weeks  PLANNED INTERVENTIONS: Therapeutic exercises, Therapeutic activity, Neuromuscular re-education, Balance training, Gait training, Patient/Family education, Self Care, Joint mobilization, Joint manipulation, Vestibular training, Canalith repositioning, Aquatic Therapy, Dry Needling, Spinal manipulation, Spinal mobilization, Cryotherapy, Moist heat, Taping, Ultrasound, Manual therapy, and Re-evaluation  PLAN FOR NEXT SESSION: Continue with strengthening of the L shoulder within pain free range.   Delphia Grates. Fairly IV, PT, DPT Physical Therapist- [place]  [facility]   08/07/22, 9:51 AM

## 2022-08-11 ENCOUNTER — Ambulatory Visit: Payer: Medicare PPO

## 2022-08-11 DIAGNOSIS — G8929 Other chronic pain: Secondary | ICD-10-CM | POA: Diagnosis not present

## 2022-08-11 DIAGNOSIS — M25612 Stiffness of left shoulder, not elsewhere classified: Secondary | ICD-10-CM

## 2022-08-11 DIAGNOSIS — R7989 Other specified abnormal findings of blood chemistry: Secondary | ICD-10-CM | POA: Diagnosis not present

## 2022-08-11 DIAGNOSIS — M25512 Pain in left shoulder: Secondary | ICD-10-CM | POA: Diagnosis not present

## 2022-08-11 NOTE — Therapy (Signed)
OUTPATIENT PHYSICAL THERAPY SHOULDER PROGRESS NOTE/DISCHARGE SUMMARY:  DATES OF REPORTING PERIOD: 06/02/22 - 08/11/22   Patient Name: Erica Pineda MRN: [id number] DOB:07-15-1950, 72 y.o., female Today's Date: 08/11/2022  END OF SESSION:  PT End of Session - 08/11/22 1042     Visit Number 10    Number of Visits 17    Date for PT Re-Evaluation 08/25/22    Authorization Type [place]    Authorization - Visit Number 10    Authorization - Number of Visits 17    Progress Note Due on Visit 10    PT Start Time 1032    PT Stop Time 1108    PT Time Calculation (min) 36 min    Activity Tolerance Patient tolerated treatment well    Behavior During Therapy WFL for tasks assessed/performed             Past Medical History:  Diagnosis Date   Cancer (HCC)    Thyroid-follwed by Endocrine   Hyperlipidemia    Past Surgical History:  Procedure Laterality Date   ABDOMINAL HYSTERECTOMY     APPENDECTOMY  1996   BUNIONECTOMY  05/2007   and hammer-toe   CATARACT EXTRACTION Bilateral    CYSTOCELE REPAIR  1989   HERNIA REPAIR Left 1993   HYMENECTOMY  1971   TONSILLECTOMY AND ADENOIDECTOMY  1972   TOTAL THYROIDECTOMY  02/08/2009   Patient Active Problem List   Diagnosis Date Noted   Insomnia 05/02/2022   Chronic left shoulder pain 05/02/2022   Hypertension associated with diabetes (HCC) 08/23/2021   Morbid obesity (HCC) 08/22/2021   T2DM (type 2 diabetes mellitus) (HCC) 08/22/2021   Stress at home 08/22/2021   Family history of breast cancer 08/10/2016   Adult hypothyroidism 06/11/2014   Avitaminosis D 06/11/2014   Hyperlipidemia associated with type 2 diabetes mellitus (HCC) 05/07/2006    PCP: Erasmo Downer, MD  REFERRING PROVIDER: Erasmo Downer, MD  REFERRING DIAG: [phone] (ICD-10-CM) - Chronic left shoulder pain   THERAPY DIAG:  Chronic left shoulder pain  Decreased ROM of left shoulder  Rationale for Evaluation and Treatment: Rehabilitation  ONSET DATE:  Several years  SUBJECTIVE:                                                                                                                                                                                      SUBJECTIVE STATEMENT: Pt reports no pain today. Has some mild discomfort performing the new shoulder ER/IR abducted to 90 deg  Hand dominance: Right  PERTINENT HISTORY: Pt has PMH including: HTN, Hypothyroidism, Insomnia, Chronic L Shoulder Pain  PAIN: Are you having pain? Yes: NPRS scale: 0/10 Pain  location: L pectoral region Pain description: sharp Aggravating factors: Movement into ER/Total scaption, IR Relieving factors: Getting out of the position  PRECAUTIONS: None  WEIGHT BEARING RESTRICTIONS: No  FALLS: Has patient fallen in last 6 months? 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  UPPER EXTREMITY ROM:   Active ROM Right eval Left eval  Shoulder flexion 175 deg 156 deg  Shoulder extension    Shoulder abduction 182 deg 179 deg*  Shoulder adduction    Shoulder internal rotation 85 deg 72 deg  Shoulder external rotation 72 deg 65 deg*  Elbow flexion    Elbow extension    Wrist flexion    Wrist extension    Wrist ulnar deviation    Wrist radial deviation    Wrist pronation    Wrist supination    (Blank rows = not tested)   UPPER EXTREMITY MMT:  MMT Right eval  Left eval Right 08/11/22 Left 08/11/22  Shoulder flexion 4+ 4+ 5 5  Shoulder extension      Shoulder abduction 4+ 3+ 5 5  Shoulder adduction      Shoulder internal rotation 5 5 5 5   Shoulder external rotation 5 5 5 5   Middle trapezius      Lower trapezius      Elbow flexion      Elbow extension      Wrist flexion      Wrist extension      Wrist ulnar deviation      Wrist radial deviation      Wrist pronation      Wrist supination      Grip strength (lbs)      (Blank rows = not tested)  SHOULDER SPECIAL TESTS: Impingement tests: Neer impingement test: positive , Hawkins/Kennedy impingement test: negative, and Painful arc test: negative Rotator cuff assessment: Empty can test: positive , Full can test: positive , and Belly press test: negative Biceps assessment: Yergason's test: negative and Speed's test: positive   JOINT MOBILITY TESTING:  Pt has adequate ROM, with slight limitation in the L shoulder with abduction when comparing to the R.  Pt however does seem to have some joint restrictions in the L shoulder when performing joint mobilization.  Pt has hypomobility with AP glide and even more when doing inferior glides.`  PALPATION:  Pt has TP's noted in L pec, L supraspinatus, L infraspinatus, L subscapular region, and L biceps    TODAY'S TREATMENT: DATE: 08/11/22   There.Ex: UBE: L5 3 min forward, 3 min backward for shoulder and periscapular warm up.   Reviewed short term and long term goals to discharge pt from PT.   Reviewed seated L shoulder Seated L shoulder ER/IR with arm resting at 90 deg abduction at shoulder propped on big, red bolster. RTB, 1x8/direction   Reviewed OMEGA exercises:   Lat pull down: 1x6, 35#   Scap retractions: 1x6, 35#   PATIENT EDUCATION: Education details:  Pt educated throughout session about proper posture and technique with exercises. Improved exercise technique, movement at target joints, use of target muscles after min to mod  verbal, visual, tactile cues Person educated: Patient Education method: Explanation, Demonstration, Tactile cues, Verbal cues, and Handouts Education comprehension: verbalized understanding, returned demonstration, verbal cues required, and tactile cues required    HOME EXERCISE PROGRAM: Access Code: [email or web address] URL: [email or web address] Date: 08/04/2022 Prepared by: Ronnie Derby  Exercises - Scapular Wall Slides  - 1 x daily - 7 x weekly - 3 sets - 8 reps - Single Arm Doorway Pec Stretch at 90 Degrees Abduction (Mirrored)  - 1 x daily - 7 x weekly - 3 sets - 10 reps - Standing Shoulder Flexion AAROM with Dowel  - 1 x daily - 7 x weekly - 3 sets - 10 reps - Shoulder extension with resistance - Neutral  - 1 x daily - 3-4 x weekly - 3 sets - 8 reps - Standing Row with Resistance with Anchored Resistance at Chest Height Palms Down  - 1 x daily - 3-4 x weekly - 3 sets - 8 reps   Access Code: [email or web address] URL: [email or web address] Date: 07/12/2022 Prepared by: Ronnie Derby  Exercises - Scapular Wall Slides  - 1 x daily - 7 x weekly - 3 sets - 8 reps - Single Arm Doorway Pec Stretch at 90 Degrees Abduction (Mirrored)  - 1 x daily - 7 x weekly - 3 sets - 10 reps - Standing Shoulder Flexion AAROM with Dowel  - 1 x daily - 7 x weekly - 3 sets - 10 reps - Shoulder extension with resistance - Neutral  - 1 x daily - 7 x weekly - 3 sets - 8 reps    Access Code: [email or web address] URL: [email or web address] Date: 07/07/2022 Prepared by: Ronnie Derby  Exercises - Isometric Shoulder Abduction with Newman Pies at Wall  - 1 x daily - 7 x weekly - 3 sets - 10 reps - Isometric Shoulder Flexion with Ball at Wall  - 1 x daily - 7 x weekly - 3 sets - 10 reps - Isometric Shoulder Extension with Ball at [employer]  - 1 x daily - 7 x weekly - 3 sets - 10 reps - Isometric Shoulder Adduction  - 1 x daily - 7 x weekly - 3 sets - 10 reps - Isometric Shoulder External Rotation with Ball  at Wall  - 1 x daily - 7 x weekly - 3 sets - 10 reps - Standing Isometric Shoulder Internal Rotation at Wall with Ball  - 1 x daily - 7 x weekly - 3 sets - 10 reps - Scapular Wall Slides  - 1 x daily - 7 x weekly - 3 sets - 10 reps - Single Arm Doorway Pec Stretch at 90 Degrees Abduction (Mirrored)  - 1 x daily - 7 x weekly - 3 sets - 10 reps - Standing Shoulder Flexion AAROM with Dowel  - 1 x daily - 7 x weekly - 3 sets - 10 reps   Access Code: [email or web address] URL: [email or web address] Date: 06/02/2022 Prepared by: Tomasa Hose  Exercises - Isometric Shoulder Abduction with Newman Pies at Wall  - 1 x daily - 7 x weekly - 3 sets - 10 reps - Isometric Shoulder Flexion with Ball at Wall  - 1 x daily - 7 x weekly - 3 sets - 10 reps - Isometric Shoulder Extension with Ball at Wall  - 1 x daily - 7 x weekly - 3 sets -  10 reps - Isometric Shoulder Adduction  - 1 x daily - 7 x weekly - 3 sets - 10 reps - Isometric Shoulder External Rotation with Ball at Wall  - 1 x daily - 7 x weekly - 3 sets - 10 reps - Standing Isometric Shoulder Internal Rotation at Wall with Ball  - 1 x daily - 7 x weekly - 3 sets - 10 reps    ASSESSMENT:  CLINICAL IMPRESSION: Pt at end of approved POC. Pt has met strength goal and HEP goal. Pt with partial met goals with FOTO and reaching behind back for female dressing ADL's. Pt reporting overall greatly improved L shoulder extension, adduction, and IR and significant pain reduction pleased with pain relief and mobility and agrees with PT discharge. Pt reviewed updated HEP with all questions addressed. Pt with excellent understanding and able to demonstrate correct form/technique with exercises listed above. Plan is to d/c this date and begin PT for her order for knee pain.   OBJECTIVE IMPAIRMENTS: decreased activity tolerance, decreased ROM, decreased strength, hypomobility, obesity, and pain.   ACTIVITY LIMITATIONS: lifting, sleeping, and dressing  PARTICIPATION  LIMITATIONS: occupation and church  PERSONAL FACTORS: Age, Fitness, Past/current experiences, Time since onset of injury/illness/exacerbation, and 1-2 comorbidities: HTN, insomnia  are also affecting patient's functional outcome.   REHAB POTENTIAL: Good  CLINICAL DECISION MAKING: Stable/uncomplicated  EVALUATION COMPLEXITY: Low   GOALS: Goals reviewed with patient? Yes  SHORT TERM GOALS: Target date: 06/30/2022  Pt will be independent with HEP in order to demonstrate increased ability to perform tasks related to occupation/hobbies. Baseline: Pt given HEP at initial evaluation for isometrics; 08/11/22: Completes as prescribed when not on vacation. Goal status: MET   LONG TERM GOALS: Target date: 08/25/2022  Patient will demonstrate improved function as evidenced by a score of 77 on FOTO measure for full participation in activities at home and in the community. Baseline: 74; 08/11/22: 67 (asking sport based questions not pertinent to pt's extracurricular activities)  Goal status: NOT MET  2.  Patient will demonstrate adequate shoulder ROM and strength to be able to don bra and dress independently with pain less than 3/10. Baseline: Pt reports 4/10 pain with IR; 08/11/22: 3/10 NPS however her AROM is improved to reach her bra to don/doff Goal status: PARTIALLY MET  3.  Patient will increase L UE gross strength to 5/5 as to improve functional strength for activity tolerance needed for piano playing and increased ADL ability. Baseline: Pt demonstrates 4/5 gross strength and noted weakness with piano playing.; 08/11/22: Reports improved ability to play piano as her strength is better. See chart above for MMT. Goal status: MET  PLAN:  PT FREQUENCY: 2x/week  PT DURATION: 12 weeks  PLANNED INTERVENTIONS: Therapeutic exercises, Therapeutic activity, Neuromuscular re-education, Balance training, Gait training, Patient/Family education, Self Care, Joint mobilization, Joint manipulation,  Vestibular training, Canalith repositioning, Aquatic Therapy, Dry Needling, Spinal manipulation, Spinal mobilization, Cryotherapy, Moist heat, Taping, Ultrasound, Manual therapy, and Re-evaluation  PLAN FOR NEXT SESSION: N/A   Delphia Grates. Fairly IV, PT, DPT Physical Therapist- [facility]   08/11/22, 12:07 PM

## 2022-08-12 LAB — HEPATIC FUNCTION PANEL
ALT: 21 IU/L (ref 0–32)
AST: 22 IU/L (ref 0–40)
Albumin: 4.1 g/dL (ref 3.8–4.8)
Alkaline Phosphatase: 80 IU/L (ref 44–121)
Bilirubin Total: 0.5 mg/dL (ref 0.0–1.2)
Bilirubin, Direct: 0.12 mg/dL (ref 0.00–0.40)
Total Protein: 6.3 g/dL (ref 6.0–8.5)

## 2022-09-04 ENCOUNTER — Ambulatory Visit: Payer: Medicare PPO

## 2022-09-08 ENCOUNTER — Ambulatory Visit: Payer: Medicare PPO

## 2022-09-11 ENCOUNTER — Ambulatory Visit: Payer: Medicare PPO

## 2022-09-12 ENCOUNTER — Ambulatory Visit: Payer: Medicare PPO | Admitting: Family Medicine

## 2022-09-19 ENCOUNTER — Ambulatory Visit: Payer: Medicare PPO | Admitting: Family Medicine

## 2022-09-19 ENCOUNTER — Ambulatory Visit: Payer: Medicare PPO

## 2022-09-22 ENCOUNTER — Ambulatory Visit: Payer: Medicare PPO

## 2022-09-25 ENCOUNTER — Ambulatory Visit: Payer: Medicare PPO | Admitting: Family Medicine

## 2022-09-26 ENCOUNTER — Ambulatory Visit: Payer: Medicare PPO

## 2022-09-26 ENCOUNTER — Ambulatory Visit (INDEPENDENT_AMBULATORY_CARE_PROVIDER_SITE_OTHER): Payer: Medicare PPO

## 2022-09-26 ENCOUNTER — Ambulatory Visit: Payer: Medicare PPO | Admitting: Family Medicine

## 2022-09-26 VITALS — Ht 68.0 in | Wt 239.0 lb

## 2022-09-26 DIAGNOSIS — Z0000 Encounter for general adult medical examination without abnormal findings: Secondary | ICD-10-CM | POA: Diagnosis not present

## 2022-09-26 NOTE — Patient Instructions (Signed)
Ms. Carosella , Thank you for taking time to come for your Medicare Wellness Visit. I appreciate your ongoing commitment to your health goals. Please review the following plan we discussed and let me know if I can assist you in the future.   Referrals/Orders/Follow-Ups/Clinician Recommendations: none  This is a list of the screening recommended for you and due dates:  Health Maintenance  Topic Date Due   Eye exam for diabetics  Never done   COVID-19 Vaccine (5 - 2023-24 season) 09/30/2021   Flu Shot  08/31/2022   Mammogram  10/26/2022   Hemoglobin A1C  12/13/2022   Yearly kidney function blood test for diabetes  06/12/2023   Yearly kidney health urinalysis for diabetes  06/13/2023   Complete foot exam   06/13/2023   Medicare Annual Wellness Visit  09/26/2023   DEXA scan (bone density measurement)  12/06/2025   Colon Cancer Screening  01/30/2027   DTaP/Tdap/Td vaccine (3 - Td or Tdap) 09/29/2030   Pneumonia Vaccine  Completed   Hepatitis C Screening  Completed   Zoster (Shingles) Vaccine  Completed   HPV Vaccine  Aged Out    Advanced directives: (Copy Requested) Please bring a copy of your health care power of attorney and living will to the office to be added to your chart at your convenience.  Next Medicare Annual Wellness Visit scheduled for next year: Yes 10/09/2023 @ 1pm telephone

## 2022-09-26 NOTE — Progress Notes (Signed)
Subjective:   Erica Pineda is a 72 y.o. female who presents for Medicare Annual (Subsequent) preventive examination.  Visit Complete: Virtual  I connected with  Erica Pineda on 09/26/22 by a audio enabled telemedicine application and verified that I am speaking with the correct person using two identifiers.  Patient Location: Home  Provider Location: Office/Clinic  I discussed the limitations of evaluation and management by telemedicine. The patient expressed understanding and agreed to proceed.  Vital Signs: Unable to obtain new vitals due to this being a telehealth visit.  Patient Medicare AWV questionnaire was completed by the patient on 09/25/22; I have confirmed that all information answered by patient is correct and no changes since this date.  Review of Systems    Cardiac Risk Factors include: advanced age (>12men, >52 women);diabetes mellitus;dyslipidemia;hypertension;obesity (BMI >30kg/m2);sedentary lifestyle    Objective:    Today's Vitals   09/25/22 2300 09/26/22 1549  Weight:  239 lb (108.4 kg)  Height:  5\' 8"  (1.727 m)  PainSc: 2     Body mass index is 36.34 kg/m.     09/26/2022    3:59 PM 06/02/2022   10:53 AM 08/08/2021   11:26 AM 07/09/2019   10:04 AM 07/08/2018   10:06 AM 07/05/2017    9:23 AM 08/10/2016   10:05 AM  Advanced Directives  Does Patient Have a Medical Advance Directive? Yes Yes No Yes Yes Yes Yes  Type of Estate agent of [place];Living will Healthcare Power of [place];Living will  Healthcare Power of [place];Living will Healthcare Power of [place];Living will Healthcare Power of [place];Living will Healthcare Power of Attorney  Does patient want to make changes to medical advance directive?  No - Patient declined       Copy of Healthcare Power of Attorney in Chart?    No - copy requested No - copy requested No - copy requested   Would patient like information on creating a medical advance directive?   No - Patient declined         Current Medications (verified) Outpatient Encounter Medications as of 09/26/2022  Medication Sig   aspirin 81 MG EC tablet Take 81 mg by mouth daily. Swallow whole.   Azelaic Acid 15 % gel Apply to face 1-2 times a day for rosacea.   Biotin 5 MG TABS Take by mouth daily.   Cetirizine HCl 10 MG CAPS Take 1 capsule by mouth as needed. Reported on 06/30/2015   Cholecalciferol (VITAMIN D3) 1000 units CAPS Take by mouth daily.   Coenzyme Q10 (COQ10 PO) Take by mouth.   econazole nitrate 1 % cream Apply topically 2 (two) times daily as needed. For 2-4 weeks or until rash cleared then PRN recurrence   hydrocortisone (ANUSOL-HC) 25 MG suppository Place 1 suppository (25 mg total) rectally 2 (two) times daily as needed for hemorrhoids or anal itching.   levothyroxine (SYNTHROID) 125 MCG tablet Take 1 tablet (125 mcg total) by mouth daily.   Misc Natural Products (ELDERBERRY ZINC/VIT C/IMMUNE MT) Take 200 mg by mouth daily.   MULTIPLE VITAMINS-MINERALS PO Take by mouth daily.   rosuvastatin (CRESTOR) 5 MG tablet Take 1 tablet (5 mg total) by mouth 2 (two) times a week.   traZODone (DESYREL) 50 MG tablet Take 0.5-1 tablets (25-50 mg total) by mouth at bedtime as needed for sleep.   Vitamin D, Ergocalciferol, (DRISDOL) 1.25 MG (50000 UNIT) CAPS capsule TAKE 1 CAPSULE BY MOUTH EVERY 7 DAYS   ELDERBERRY PO Take by mouth.  No facility-administered encounter medications on file as of 09/26/2022.    Allergies (verified) Percodan  [oxycodone-aspirin] and Simvastatin   History: Past Medical History:  Diagnosis Date   Allergy    Arthritis    thumbs   Cancer (HCC)    Thyroid-follwed by Endocrine   Cataract    surgery 2021 (?)   Diabetes mellitus without complication (HCC)    I'm not sure what mellitus is   Hyperlipidemia    Past Surgical History:  Procedure Laterality Date   ABDOMINAL HYSTERECTOMY     APPENDECTOMY  1996   BUNIONECTOMY  05/2007   and hammer-toe   CATARACT EXTRACTION  Bilateral    COSMETIC SURGERY  2024   eyelids   CYSTOCELE REPAIR  1989   EYE SURGERY  2021 (?)   cataracts   HERNIA REPAIR Left 1993   HYMENECTOMY  1971   TONSILLECTOMY AND ADENOIDECTOMY  1972   TOTAL THYROIDECTOMY  02/08/2009   TUBAL LIGATION  1989   after 3rd pregnancy   Family History  Problem Relation Age of Onset   Breast cancer Sister    Diabetes Sister    Osteoporosis Mother    Stroke Mother        in 2014   Alzheimer's disease Father    Diabetes Father    Varicose Veins Father    Diabetes Sister    Breast cancer Maternal Aunt    Hypertension Other    Hyperlipidemia Other    Heart disease Other    Diabetes Other    Alzheimer's disease Other    Social History   Socioeconomic History   Marital status: Married    Spouse name: Jeannett Senior   Number of children: 3   Years of education: 19   Highest education level: Master's degree (e.g., MA, MS, MEng, MEd, MSW, MBA)  Occupational History   Occupation: Horticulturist, commercial: [employer]    Comment: Visual merchandiser    Comment: retired  Tobacco Use   Smoking status: Never   Smokeless tobacco: Never  Vaping Use   Vaping status: Never Used  Substance and Sexual Activity   Alcohol use: Yes    Alcohol/week: 1.0 - 2.0 standard drink of alcohol    Types: 1 - 2 Glasses of wine per week    Comment: occasional   Drug use: No   Sexual activity: Yes    Comment: complete hysterectomy  Other Topics Concern   Not on file  Social History Narrative   Not on file   Social Determinants of Health   Financial Resource Strain: Low Risk  (09/25/2022)   Overall Financial Resource Strain (CARDIA)    Difficulty of Paying Living Expenses: Not hard at all  Food Insecurity: No Food Insecurity (09/25/2022)   Hunger Vital Sign    Worried About Running Out of Food in the Last Year: Never true    Ran Out of Food in the Last Year: Never true  Transportation Needs: No Transportation Needs (09/25/2022)   PRAPARE - Scientist, research (physical sciences) (Medical): No    Lack of Transportation (Non-Medical): No  Physical Activity: Insufficiently Active (09/25/2022)   Exercise Vital Sign    Days of Exercise per Week: 3 days    Minutes of Exercise per Session: 30 min  Stress: Stress Concern Present (09/25/2022)   [employer] of Occupational Health - Occupational Stress Questionnaire    Feeling of Stress : To some extent  Social Connections: Unknown (09/25/2022)  Social Connection and Isolation Panel [NHANES]    Frequency of Communication with Friends and Family: More than three times a week    Frequency of Social Gatherings with Friends and Family: Three times a week    Attends Religious Services: Not on file    Active Member of Clubs or Organizations: Yes    Attends Banker Meetings: More than 4 times per year    Marital Status: Married    Tobacco Counseling Counseling given: Not Answered  Clinical Intake:  Pre-visit preparation completed: Yes  Pain : 0-10 Pain Score: 2  Pain Type: Chronic pain Pain Location: Shoulder Pain Orientation: Left Pain Descriptors / Indicators: Aching Pain Onset: More than a month ago Pain Frequency: Intermittent Pain Relieving Factors: exercises  Pain Relieving Factors: exercises  BMI - recorded: 36.34 Nutritional Status: BMI > 30  Obese Nutritional Risks: None Diabetes: Yes CBG done?: No Did pt. bring in CBG monitor from home?: No  How often do you need to have someone help you when you read instructions, pamphlets, or other written materials from your doctor or pharmacy?: 1 - Never  Interpreter Needed?: No  Comments: lives with husband Information entered by :: B.Rosaria Kubin,LPN   Activities of Daily Living    09/25/2022   11:00 PM 06/13/2022    9:41 AM  In your present state of health, do you have any difficulty performing the following activities:  Hearing? 0 0  Vision? 0 0  Difficulty concentrating or making decisions? 0 0  Walking or climbing  stairs? 0 0  Dressing or bathing? 0 0  Doing errands, shopping? 0 0  Preparing Food and eating ? N   Using the Toilet? N   In the past six months, have you accidently leaked urine? Y   Do you have problems with loss of bowel control? N   Managing your Medications? N   Managing your Finances? N   Housekeeping or managing your Housekeeping? N     Patient Care Team: Erasmo Downer, MD as PCP - General (Family Medicine) Linus Salmons, MD as Consulting Physician (Otolaryngology) Recardo Evangelist, DPM (Inactive) as Attending Physician (Podiatry) Lady Gary Darlin Priestly, MD as Consulting Physician (Cardiology) Josefina Do, DC as Referring Physician (Chiropractic Medicine)  Indicate any recent Medical Services you may have received from other than [facility] providers in the past year (date may be approximate).     Assessment:   This is a routine wellness examination for Staley.  Hearing/Vision screen Hearing Screening - Comments:: Adequate hearing Vision Screening - Comments:: Adequate vision for far vision [facility]  Dietary issues and exercise activities discussed:     Goals Addressed             This Visit's Progress    DIET - EAT MORE FRUITS AND VEGETABLES   On track    Reduce portion size   On track    Recommend to decrease portion sizes by eating 3 small healthy meals and at least 2 healthy snacks per day.       Depression Screen    09/26/2022    3:58 PM 06/13/2022    9:41 AM 05/02/2022   10:06 AM 03/16/2022    2:25 PM 01/05/2022    1:12 PM 08/22/2021    2:27 PM 08/08/2021   11:24 AM  PHQ 2/9 Scores  PHQ - 2 Score 0 0 2 0 0 0 0  PHQ- 9 Score  0 5 2 0 1 0    Fall Risk  09/25/2022   11:00 PM 06/13/2022    9:41 AM 05/02/2022   10:05 AM 03/16/2022    2:25 PM 01/05/2022    1:11 PM  Fall Risk   Falls in the past year? 1 0 0 0 0  Comment risers at church caught foot      Number falls in past yr: 0 0 0 0 0  Injury with Fall? 1 0 0 0 0  Risk for fall due to : Orthopedic  patient;No Fall Risks No Fall Risks No Fall Risks No Fall Risks No Fall Risks  Follow up Falls prevention discussed;Education provided Falls evaluation completed Falls evaluation completed Falls evaluation completed Falls evaluation completed    MEDICARE RISK AT HOME: Medicare Risk at Home Any stairs in or around the home?: Yes If so, are there any without handrails?: No Home free of loose throw rugs in walkways, pet beds, electrical cords, etc?: Yes Adequate lighting in your home to reduce risk of falls?: Yes Life alert?: No Use of a cane, walker or w/c?: No Grab bars in the bathroom?: Yes Shower chair or bench in shower?: No Elevated toilet seat or a handicapped toilet?: Yes  TIMED UP AND GO:  Was the test performed?  No    Cognitive Function:        09/26/2022    4:00 PM 08/08/2021   11:29 AM 07/09/2019   10:11 AM 07/08/2018   10:16 AM 07/05/2017    9:30 AM  6CIT Screen  What Year? 0 points 0 points 0 points 0 points 0 points  What month? 0 points 0 points 0 points 0 points 0 points  What time? 0 points 0 points 0 points 0 points 0 points  Count back from 20 0 points 0 points 0 points 0 points 0 points  Months in reverse 0 points 0 points 0 points 0 points 0 points  Repeat phrase 0 points 0 points 0 points 0 points 0 points  Total Score 0 points 0 points 0 points 0 points 0 points    Immunizations Immunization History  Administered Date(s) Administered   Fluad Quad(high Dose 65+) 11/06/2018, 10/12/2021   Influenza, High Dose Seasonal PF 01/26/2018, 10/13/2019   Influenza,inj,Quad PF,6+ Mos 12/16/2012   Moderna Sars-Covid-2 Vaccination 03/01/2019, 03/29/2019   PFIZER(Purple Top)SARS-COV-2 Vaccination 12/29/2019, 08/22/2020   Pneumococcal Conjugate-13 08/10/2016   Pneumococcal Polysaccharide-23 08/21/2017   Tdap 03/31/2010, 09/28/2020   Zoster Recombinant(Shingrix) 08/10/2016, 10/10/2016    TDAP status: Up to date  Flu Vaccine status: Up to date  Pneumococcal  vaccine status: Up to date  Covid-19 vaccine status: Completed vaccines  Qualifies for Shingles Vaccine? Yes   Zostavax completed Yes   Shingrix Completed?: Yes  Screening Tests Health Maintenance  Topic Date Due   OPHTHALMOLOGY EXAM  Never done   COVID-19 Vaccine (5 - 2023-24 season) 09/30/2021   INFLUENZA VACCINE  08/31/2022   MAMMOGRAM  10/26/2022   HEMOGLOBIN A1C  12/13/2022   Diabetic kidney evaluation - eGFR measurement  06/12/2023   Diabetic kidney evaluation - Urine ACR  06/13/2023   FOOT EXAM  06/13/2023   Medicare Annual Wellness (AWV)  09/26/2023   DEXA SCAN  12/06/2025   Colonoscopy  01/30/2027   DTaP/Tdap/Td (3 - Td or Tdap) 09/29/2030   Pneumonia Vaccine 82+ Years old  Completed   Hepatitis C Screening  Completed   Zoster Vaccines- Shingrix  Completed   HPV VACCINES  Aged Out    Health Maintenance  Health Maintenance Due  Topic Date  Due   OPHTHALMOLOGY EXAM  Never done   COVID-19 Vaccine (5 - 2023-24 season) 09/30/2021   INFLUENZA VACCINE  08/31/2022    Colorectal cancer screening: Type of screening: Colonoscopy. Completed yes. Repeat every 5-10 years  Mammogram status: Completed no. Repeat every year already scheduled  Bone Density status: Completed yes. Results reflect: Bone density results: NORMAL. Repeat every 5 years.  Lung Cancer Screening: (Low Dose CT Chest recommended if Age 1-80 years, 20 pack-year currently smoking OR have quit w/in 15years.) does not qualify.   Lung Cancer Screening Referral: no  Additional Screening:  Hepatitis C Screening: does not qualify; Completed yes  Vision Screening: Recommended annual ophthalmology exams for early detection of glaucoma and other disorders of the eye. Is the patient up to date with their annual eye exam?  Yes  Who is the provider or what is the name of the office in which the patient attends annual eye exams? [facility] If pt is not established with a provider, would they like to be referred to a  provider to establish care? No .   Dental Screening: Recommended annual dental exams for proper oral hygiene  Diabetic Foot Exam: Diabetic Foot Exam: Completed yes  Community Resource Referral / Chronic Care Management: CRR required this visit?  No   CCM required this visit?  No    Plan:     I have personally reviewed and noted the following in the patient's chart:   Medical and social history Use of alcohol, tobacco or illicit drugs  Current medications and supplements including opioid prescriptions. Patient is not currently taking opioid prescriptions. Functional ability and status Nutritional status Physical activity Advanced directives List of other physicians Hospitalizations, surgeries, and ER visits in previous 12 months Vitals Screenings to include cognitive, depression, and falls Referrals and appointments  In addition, I have reviewed and discussed with patient certain preventive protocols, quality metrics, and best practice recommendations. A written personalized care plan for preventive services as well as general preventive health recommendations were provided to patient.    Sue Lush, LPN   [id number]   After Visit Summary: (MyChart) Due to this being a telephonic visit, the after visit summary with patients personalized plan was offered to patient via MyChart   Nurse Notes: The patient states she is doing well and has no concerns or questions at this time.

## 2022-09-28 ENCOUNTER — Encounter (INDEPENDENT_AMBULATORY_CARE_PROVIDER_SITE_OTHER): Payer: Medicare PPO | Admitting: Family Medicine

## 2022-09-28 DIAGNOSIS — N3090 Cystitis, unspecified without hematuria: Secondary | ICD-10-CM | POA: Diagnosis not present

## 2022-09-29 MED ORDER — CEPHALEXIN 500 MG PO CAPS: 500.0000 mg | ORAL_CAPSULE | Freq: Three times a day (TID) | ORAL | 0 refills | Status: AC

## 2022-09-29 NOTE — Telephone Encounter (Signed)
Please see the MyChart message reply(ies) for my assessment and plan.    This patient gave consent for this Medical Advice Message and is aware that it may result in a bill to their insurance company, as well as the possibility of receiving a bill for a co-payment or deductible. They are an established patient, but are not seeking medical advice exclusively about a problem treated during an in person or video visit in the last seven days. I did not recommend an in person or video visit within seven days of my reply.    I spent a total of 5 minutes cumulative time within 7 days through MyChart messaging.  Angela Bacigalupo, MD   

## 2022-10-03 ENCOUNTER — Ambulatory Visit: Payer: Medicare PPO | Admitting: Family Medicine

## 2022-10-04 DIAGNOSIS — E785 Hyperlipidemia, unspecified: Secondary | ICD-10-CM | POA: Diagnosis not present

## 2022-10-04 DIAGNOSIS — E1169 Type 2 diabetes mellitus with other specified complication: Secondary | ICD-10-CM | POA: Diagnosis not present

## 2022-10-04 DIAGNOSIS — I152 Hypertension secondary to endocrine disorders: Secondary | ICD-10-CM | POA: Diagnosis not present

## 2022-10-04 DIAGNOSIS — E1159 Type 2 diabetes mellitus with other circulatory complications: Secondary | ICD-10-CM | POA: Diagnosis not present

## 2022-10-05 ENCOUNTER — Ambulatory Visit: Payer: Medicare PPO

## 2022-10-06 ENCOUNTER — Encounter: Payer: Self-pay | Admitting: Family Medicine

## 2022-10-06 ENCOUNTER — Ambulatory Visit: Payer: Medicare PPO | Admitting: Family Medicine

## 2022-10-06 VITALS — BP 127/72 | HR 78 | Temp 97.8°F | Resp 12 | Ht 68.0 in | Wt 242.0 lb

## 2022-10-06 DIAGNOSIS — E1159 Type 2 diabetes mellitus with other circulatory complications: Secondary | ICD-10-CM | POA: Diagnosis not present

## 2022-10-06 DIAGNOSIS — N3946 Mixed incontinence: Secondary | ICD-10-CM

## 2022-10-06 DIAGNOSIS — M159 Polyosteoarthritis, unspecified: Secondary | ICD-10-CM | POA: Diagnosis not present

## 2022-10-06 DIAGNOSIS — Z87448 Personal history of other diseases of urinary system: Secondary | ICD-10-CM | POA: Diagnosis not present

## 2022-10-06 DIAGNOSIS — E039 Hypothyroidism, unspecified: Secondary | ICD-10-CM

## 2022-10-06 DIAGNOSIS — N390 Urinary tract infection, site not specified: Secondary | ICD-10-CM

## 2022-10-06 DIAGNOSIS — E1169 Type 2 diabetes mellitus with other specified complication: Secondary | ICD-10-CM

## 2022-10-06 DIAGNOSIS — E785 Hyperlipidemia, unspecified: Secondary | ICD-10-CM

## 2022-10-06 DIAGNOSIS — I152 Hypertension secondary to endocrine disorders: Secondary | ICD-10-CM

## 2022-10-06 DIAGNOSIS — Z23 Encounter for immunization: Secondary | ICD-10-CM | POA: Diagnosis not present

## 2022-10-06 DIAGNOSIS — F5101 Primary insomnia: Secondary | ICD-10-CM

## 2022-10-06 MED ORDER — EZETIMIBE 10 MG PO TABS
10.0000 mg | ORAL_TABLET | Freq: Every day | ORAL | 3 refills | Status: DC
Start: 2022-10-06 — End: 2023-06-04

## 2022-10-06 MED ORDER — TRAZODONE HCL 100 MG PO TABS: 100.0000 mg | ORAL_TABLET | Freq: Every evening | ORAL | 3 refills | Status: DC | PRN

## 2022-10-06 NOTE — Assessment & Plan Note (Signed)
A1c of 6.4, well controlled with diet alone. Discussed the goal of keeping A1c under 7. -Continue diet control. -Continue monitoring A1c levels.

## 2022-10-06 NOTE — Assessment & Plan Note (Signed)
Patient reports body aches possibly related to rosuvastatin use. Cholesterol levels have improved but remain suboptimal on twice weekly rosuvastatin. Discussed the addition of Zetia to further lower cholesterol levels. -Add Zetia daily. -Continue rosuvastatin twice weekly. -Check cholesterol levels in three months.

## 2022-10-06 NOTE — Assessment & Plan Note (Signed)
Well-controlled on last TSH-reviewed today Continue Synthroid at current dose

## 2022-10-06 NOTE — Progress Notes (Signed)
Established Patient Office Visit  Subjective   Patient ID: Erica Pineda, female    DOB: 12-May-1950  Age: 72 y.o. MRN: [id number]  Chief Complaint  Patient presents with   Medical Management of Chronic Issues    HPI  Discussed the use of AI scribe software for clinical note transcription with the patient, who gave verbal consent to proceed.  History of Present Illness   The patient, with a history of high cholesterol and diabetes, presents for a routine follow-up. She reports that her cholesterol levels have improved but are still not at the desired level despite taking rosuvastatin twice a week. The patient also mentions experiencing body aches, which she is unsure if it is related to the rosuvastatin. Her diabetes is well-controlled with an A1c of 6.4.  The patient also reports recurrent urinary tract infections (UTIs) since June, with one confirmed UTI and two suspected UTIs that resolved with increased fluid intake. She also mentions a history of bladder prolapse and current issues with urinary incontinence, expressing interest in exploring treatment options.  The patient also reports difficulty sleeping and is currently taking trazodone, which she feels is not effective. She expresses interest in increasing the dosage.  The patient also mentions the stress of caring for her husband who has Alzheimer's and poorly controlled diabetes. She occasionally has "pity parties" for herself but does not believe she is depressed.         ROS per HPI    Objective:     BP 127/72 (BP Location: Left Arm, Patient Position: Sitting, Cuff Size: Large)   Pulse 78   Temp 97.8 F (36.6 C) (Temporal)   Resp 12   Ht 5\' 8"  (1.727 m)   Wt 242 lb (109.8 kg)   BMI 36.80 kg/m    Physical Exam Vitals reviewed.  Constitutional:      General: She is not in acute distress.    Appearance: Normal appearance. She is well-developed. She is not diaphoretic.  HENT:     Head: Normocephalic and atraumatic.   Eyes:     General: No scleral icterus.    Conjunctiva/sclera: Conjunctivae normal.  Neck:     Thyroid: No thyromegaly.  Cardiovascular:     Rate and Rhythm: Normal rate and regular rhythm.     Heart sounds: Normal heart sounds. No murmur heard. Pulmonary:     Effort: Pulmonary effort is normal. No respiratory distress.     Breath sounds: Normal breath sounds. No wheezing, rhonchi or rales.  Musculoskeletal:     Cervical back: Neck supple.     Right lower leg: No edema.     Left lower leg: No edema.  Lymphadenopathy:     Cervical: No cervical adenopathy.  Skin:    General: Skin is warm and dry.     Findings: No rash.  Neurological:     Mental Status: She is alert and oriented to person, place, and time. Mental status is at baseline.  Psychiatric:        Mood and Affect: Mood normal.        Behavior: Behavior normal.      No results found for any visits on 10/06/22.    The 10-year ASCVD risk score (Arnett DK, et al., 2019) is: 20.1%    Assessment & Plan:   Problem List Items Addressed This Visit       Cardiovascular and Mediastinum   Hypertension associated with diabetes (HCC) - Primary    Well controlled Continue current medications Recheck  metabolic panel at next appt F/u in 3 months       Relevant Medications   ezetimibe (ZETIA) 10 MG tablet   Other Relevant Orders   Comprehensive metabolic panel     Endocrine   Hyperlipidemia associated with type 2 diabetes mellitus (HCC)    Patient reports body aches possibly related to rosuvastatin use. Cholesterol levels have improved but remain suboptimal on twice weekly rosuvastatin. Discussed the addition of Zetia to further lower cholesterol levels. -Add Zetia daily. -Continue rosuvastatin twice weekly. -Check cholesterol levels in three months.      Relevant Medications   ezetimibe (ZETIA) 10 MG tablet   Other Relevant Orders   Lipid panel   Comprehensive metabolic panel   Adult hypothyroidism     Well-controlled on last TSH-reviewed today Continue Synthroid at current dose      Relevant Orders   TSH   T2DM (type 2 diabetes mellitus) (HCC)    A1c of 6.4, well controlled with diet alone. Discussed the goal of keeping A1c under 7. -Continue diet control. -Continue monitoring A1c levels.      Relevant Orders   Hemoglobin A1c     Other   Morbid obesity (HCC)    Discussed importance of healthy weight management Discussed diet and exercise       Insomnia    Patient reports trazodone 50mg  is not effective for sleep. Discussed increasing the dose. -Increase trazodone to 100mg  as needed for sleep.      Other Visit Diagnoses     Recurrent UTI       Mixed incontinence urge and stress       Relevant Orders   Ambulatory referral to Urogynecology   History of prolapse of bladder       Relevant Orders   Ambulatory referral to Urogynecology   Primary osteoarthritis involving multiple joints       Encounter for immunization       Relevant Orders   Flu Vaccine Trivalent High Dose (Fluad) (Completed)           Urinary Incontinence Patient reports symptoms of urge incontinence and stress incontinence. Discussed referral to urogynecology for further evaluation and possible treatment options. -Refer to urogynecology for evaluation and management.  General Health Maintenance -Physical exam in three months. -Reminded patient to have an eye exam.        Return in about 3 months (around 01/05/2023) for CPE.    [name], MD

## 2022-10-06 NOTE — Assessment & Plan Note (Signed)
Well controlled Continue current medications Recheck metabolic panel at next appt F/u in 3 months

## 2022-10-06 NOTE — Assessment & Plan Note (Signed)
Patient reports trazodone 50mg  is not effective for sleep. Discussed increasing the dose. -Increase trazodone to 100mg  as needed for sleep.

## 2022-10-06 NOTE — Assessment & Plan Note (Signed)
Discussed importance of healthy weight management Discussed diet and exercise  

## 2022-10-09 ENCOUNTER — Ambulatory Visit: Payer: Medicare PPO

## 2022-10-09 ENCOUNTER — Ambulatory Visit: Payer: Medicare PPO | Attending: Physician Assistant

## 2022-10-09 ENCOUNTER — Other Ambulatory Visit: Payer: Self-pay

## 2022-10-09 DIAGNOSIS — M25562 Pain in left knee: Secondary | ICD-10-CM | POA: Insufficient documentation

## 2022-10-09 DIAGNOSIS — G8929 Other chronic pain: Secondary | ICD-10-CM | POA: Insufficient documentation

## 2022-10-09 DIAGNOSIS — M6281 Muscle weakness (generalized): Secondary | ICD-10-CM | POA: Insufficient documentation

## 2022-10-09 NOTE — Therapy (Addendum)
OUTPATIENT PHYSICAL THERAPY LOWER EXTREMITY TREATMENT   Patient Name: Erica Pineda MRN: [id number] DOB:[birth year], 72 y.o., female Today's Date: 10/09/2022  END OF SESSION:  PT End of Session - 10/09/22 0954     Visit Number 1    Number of Visits 6    Date for PT Re-Evaluation 11/20/22    PT Start Time 0901    PT Stop Time 0945    PT Time Calculation (min) 44 min    Activity Tolerance Patient tolerated treatment well             Past Medical History:  Diagnosis Date   Allergy    Arthritis    thumbs   Cancer (HCC)    Thyroid-follwed by Endocrine   Cataract    surgery 2021 (?)   Diabetes mellitus without complication (HCC)    I'm not sure what mellitus is   Hyperlipidemia    Past Surgical History:  Procedure Laterality Date   ABDOMINAL HYSTERECTOMY     APPENDECTOMY  1996   BUNIONECTOMY  05/2007   and hammer-toe   CATARACT EXTRACTION Bilateral    COSMETIC SURGERY  2024   eyelids   CYSTOCELE REPAIR  1989   EYE SURGERY  2021 (?)   cataracts   HERNIA REPAIR Left 1993   HYMENECTOMY  1971   TONSILLECTOMY AND ADENOIDECTOMY  1972   TOTAL THYROIDECTOMY  02/08/2009   TUBAL LIGATION  1989   after 3rd pregnancy   Patient Active Problem List   Diagnosis Date Noted   Insomnia 05/02/2022   Chronic left shoulder pain 05/02/2022   Hypertension associated with diabetes (HCC) 08/23/2021   Morbid obesity (HCC) 08/22/2021   T2DM (type 2 diabetes mellitus) (HCC) 08/22/2021   Stress at home 08/22/2021   Family history of breast cancer 08/10/2016   Adult hypothyroidism 06/11/2014   Avitaminosis D 06/11/2014   Hyperlipidemia associated with type 2 diabetes mellitus (HCC) 05/07/2006    PCP: Erasmo Downer, MD  REFERRING PROVIDER: Tera Partridge, PA  REFERRING DIAG: [phone] (ICD-10-CM) - Unilateral primary osteoarthritis, left knee  THERAPY DIAG:  Chronic pain of left knee  Muscle weakness (generalized)  Rationale for Evaluation and Treatment: Rehabilitation  ONSET  DATE: For many years   SUBJECTIVE:   SUBJECTIVE STATEMENT:  Pt is a pleasant presents 72 y/o referred to [facility] today w/ chronic L knee pain. She reports that the pain has felt much better over the past couple of weeks and she received a steroid injection in the L knee in April.    Last injection in April relief for a while, not much relief from last injection. L > R  PERTINENT HISTORY: Pt reports chronic L knee> R knee pain. This pain began with insidious onset gradually over the last several years 2/2 to knee OA. Pain at anterior medial aspect of the L knee joint line and anterior lat aspect of R knee joint line but does not radiate down either LE. Pain described as stabbing and sharp. Denies radicular symptoms in LLE/ RLE along with joint crepitus, popping or clicking in the knee joint. When pt is having L knee pain, her aggravating factors include prolonged walking and ascending steps and the pain has been relieved with steroid injections, and OTC medications such as arthritis tylenol.   PAIN:  Are you having pain? No pain reported at today's session 0/10 Pain location: Medial aspect of anterior L knee Pain description: Sharp pain, No N/T noted Aggravating factors: Walking, ascending steps  Relieving  factors: Injections, OTC Arthritis Tylenol  Worst pain: 7/10 when pt is experiencing L knee pain.   PRECAUTIONS: None  RED FLAGS: Bowel or bladder incontinence: No, Spinal tumors: No, and Cauda equina syndrome: No   WEIGHT BEARING RESTRICTIONS: No  FALLS:  Has patient fallen in last 6 months? Yes. Number of falls 1   Pt reports falling after tripping over obstacle at church.   OCCUPATION: Retired   PLOF: Independent  PATIENT GOALS: To be pain free  NEXT MD VISIT: Three months w/ PCP    OBJECTIVE:   DIAGNOSTIC FINDINGS: N/A  PATIENT SURVEYS:  FOTO 86/79  COGNITION: Overall cognitive status: Within functional limits for tasks assessed     SENSATION: WFL  MUSCLE  LENGTH: Bilateral Hamstring and quadriceps length WFL   POSTURE:  Trendelenburg gait noted w/ ambulation, along with mod eversion of RLE and LLE.   PALPATION: TTP at anterior medial aspect of L knee joint line, and anterior lateral aspect of R knee joint line.   LOWER EXTREMITY ROM:  Active ROM Right eval Left eval  Hip flexion    Hip extension    Hip abduction    Hip adduction    Hip internal rotation    Hip external rotation    Knee flexion 134 132  Knee extension    Ankle dorsiflexion    Ankle plantarflexion    Ankle inversion    Ankle eversion     (Blank rows = not tested)  LOWER EXTREMITY MMT:  MMT Right eval Left eval  Hip flexion 4+ 4  Hip extension 4- 4-  Hip abduction 4- 4-  Hip adduction 4- 4-  Hip internal rotation 4+ 4+  Hip external rotation 4+ 4+  Knee flexion 4+ 4  Knee extension 4+ 4+  Ankle dorsiflexion 4+ 4+  Ankle plantarflexion 4 4  Ankle inversion    Ankle eversion     (Blank rows = not tested)  LOWER EXTREMITY SPECIAL TESTS:  Hip special tests: Ely's test: negative Knee special tests: Thessaly test: negative  FUNCTIONAL TESTS:  30 Second STS: 14 TUG: 6.21 seconds Squat assessment: Significant bilat knee valgus noted, no pain  GAIT: Distance walked: 180ft Assistive device utilized: None Level of assistance: Complete Independence Comments: Pt notes increased bilat LE eversion, trendelenburg, increased bilat knee valgus   TODAY'S TREATMENT: DATE: 10/09/22   TherEx:  Sidelying clamshells, x10 each side Sidelying reverse clamshells, x10 each side Progressed to utilizing resistance due to ease of performing standard clamshells/reverse clamshells Sidelying resisted clamshells, red theraband resistance, x10 each side Sidelying resisted clamshells, red theraband resistance, x10 each side Standing minisquats with UE support, 2x10 Standing hip extension with UE support, x10 each LE    PATIENT EDUCATION:  Education details: HEP  given to pt Person educated: Patient Education method: Medical illustrator Education comprehension: verbalized understanding and returned demonstration  HOME EXERCISE PROGRAM:  Access Code: [email or web address] URL: [email or web address] Date: 10/09/2022 Prepared by: Tomasa Hose  Exercises - Clam with Resistance  - 1 x daily - 7 x weekly - 3 sets - 10 reps - Sidelying Reverse Clamshell with Resistance  - 1 x daily - 7 x weekly - 3 sets - 10 reps - Mini Squat with Counter Support  - 1 x daily - 7 x weekly - 3 sets - 10 reps - Standing Hip Extension with Unilateral Counter Support  - 1 x daily - 7 x weekly - 3 sets - 10 reps  ASSESSMENT:  CLINICAL IMPRESSION:  Patient is  a 72 y.o. F who was seen today for physical therapy evaluation and treatment for chronic L knee pain 2/2 OA. Pt demonstrates normal joint mobility in all planes and AROM WFL for flexion and extension. Pt presents with deficits including trendelenburg gait, proximal hip weakness, bilat knee valgus and prolonged walking and ascending stairs when pt does have L knee pain. Pt displays significant bilat knee valgus noted when completing squat. Pt also has TTP at the ant medial aspect of the L knee specifically around the joint line. Further testing next session to include patellar mobility assessment and hip adductor testing. When pt does have pain in the L knee these deficits are limiting participation in prolonged walking and stair negotiation ADL's due to moderate pain levels. Pt will benefit from skilled PT services to address these deficits to improve bilat LE strengthening and maximize functional capacity with walking, ascending stairs, and household ADL's.    OBJECTIVE IMPAIRMENTS: Abnormal gait, decreased mobility, decreased strength, hypomobility, and pain.   ACTIVITY LIMITATIONS: squatting, stairs, and walking  PARTICIPATION LIMITATIONS: community activity  PERSONAL FACTORS: Age, Time since onset of  injury/illness/exacerbation, and 1 comorbidity: knee OA  are also affecting patient's functional outcome.   REHAB POTENTIAL: Good  CLINICAL DECISION MAKING: Stable/uncomplicated  EVALUATION COMPLEXITY: Low   GOALS: Goals reviewed with patient? Yes  SHORT TERM GOALS: Target date: 10/30/22 Pt will be independent with HEP to improve bilat LE strength and decrease pain with functional activities  Baseline: 10/09/22: HEP given to pt Goal status: INITIAL  LONG TERM GOALS: Target date: 11/20/22  Pt will complete five squats in a row, with correct form noting no valgus of bilat knees and 0/10 NPS to improve functional movement in assisting w/ ADL's. Baseline: 10/09/22: Increased bilat knee valgus noted when completing squat Goal status: INITIAL  2.  Pt will improve bilat hip ext and abduction strength to 5/5, to note improvement in functional strength and decreased trendelenburg w/ ambulation.  Baseline: 10/09/22: Trendelenburg gait noted w/ ambulation; bilat hip ext/ abd strength 4-.  Goal status: INITIAL  MMT Right Eval 9/9 Left Eval 9/9  Hip flexion 4+ 4  Hip extension 4- 4-  Hip abduction 4- 4-    3.  Pt will increase 6 MWT by > 165' to display improvements in functional endurance with community ambulation.             Baseline: 10/09/22: deferred to next session   Goal status: INITIAL    PLAN:  PT FREQUENCY: 1x/week  PT DURATION: 6 weeks  PLANNED INTERVENTIONS: Therapeutic exercises, Therapeutic activity, Neuromuscular re-education, Balance training, Gait training, Patient/Family education, Self Care, Joint mobilization, Joint manipulation, Stair training, Cryotherapy, Moist heat, Manual therapy, and Re-evaluation   PLAN FOR NEXT SESSION: Assess ,  bilat Patellar mobilizations, adductor strength testing (further), reassess HEP.   Marney Doctor, Student-PT 10/09/2022, 12:22 PM   Nolon Bussing, PT, DPT Physical Therapist - [facility]   10/09/22, 2:33 PM

## 2022-10-11 DIAGNOSIS — M9901 Segmental and somatic dysfunction of cervical region: Secondary | ICD-10-CM | POA: Diagnosis not present

## 2022-10-11 DIAGNOSIS — M542 Cervicalgia: Secondary | ICD-10-CM | POA: Diagnosis not present

## 2022-10-11 DIAGNOSIS — M9902 Segmental and somatic dysfunction of thoracic region: Secondary | ICD-10-CM | POA: Diagnosis not present

## 2022-10-11 DIAGNOSIS — M5414 Radiculopathy, thoracic region: Secondary | ICD-10-CM | POA: Diagnosis not present

## 2022-10-17 ENCOUNTER — Ambulatory Visit: Payer: Medicare PPO

## 2022-10-23 ENCOUNTER — Ambulatory Visit: Payer: Medicare PPO

## 2022-10-23 DIAGNOSIS — M6281 Muscle weakness (generalized): Secondary | ICD-10-CM | POA: Diagnosis not present

## 2022-10-23 DIAGNOSIS — G8929 Other chronic pain: Secondary | ICD-10-CM

## 2022-10-23 DIAGNOSIS — M25562 Pain in left knee: Secondary | ICD-10-CM | POA: Diagnosis not present

## 2022-10-23 NOTE — Therapy (Addendum)
OUTPATIENT PHYSICAL THERAPY LOWER EXTREMITY TREATMENT   Patient Name: Erica Pineda MRN: [id number] DOB:02-Jan-1951, 72 y.o., female Today's Date: 10/23/2022  END OF SESSION:  PT End of Session - 10/23/22 0857     Visit Number 2    Number of Visits 6    Date for PT Re-Evaluation 11/20/22    PT Start Time 0900    PT Stop Time 0944    PT Time Calculation (min) 44 min    Activity Tolerance Patient tolerated treatment well             Past Medical History:  Diagnosis Date   Allergy    Arthritis    thumbs   Cancer (HCC)    Thyroid-follwed by Endocrine   Cataract    surgery 2021 (?)   Diabetes mellitus without complication (HCC)    I'm not sure what mellitus is   Hyperlipidemia    Past Surgical History:  Procedure Laterality Date   ABDOMINAL HYSTERECTOMY     APPENDECTOMY  1996   BUNIONECTOMY  05/2007   and hammer-toe   CATARACT EXTRACTION Bilateral    COSMETIC SURGERY  2024   eyelids   CYSTOCELE REPAIR  1989   EYE SURGERY  2021 (?)   cataracts   HERNIA REPAIR Left 1993   HYMENECTOMY  1971   TONSILLECTOMY AND ADENOIDECTOMY  1972   TOTAL THYROIDECTOMY  02/08/2009   TUBAL LIGATION  1989   after 3rd pregnancy   Patient Active Problem List   Diagnosis Date Noted   Insomnia 05/02/2022   Chronic left shoulder pain 05/02/2022   Hypertension associated with diabetes (HCC) 08/23/2021   Morbid obesity (HCC) 08/22/2021   T2DM (type 2 diabetes mellitus) (HCC) 08/22/2021   Stress at home 08/22/2021   Family history of breast cancer 08/10/2016   Adult hypothyroidism 06/11/2014   Avitaminosis D 06/11/2014   Hyperlipidemia associated with type 2 diabetes mellitus (HCC) 05/07/2006    PCP: Erasmo Downer, MD  REFERRING PROVIDER: Tera Partridge, PA  REFERRING DIAG: [phone] (ICD-10-CM) - Unilateral primary osteoarthritis, left knee  THERAPY DIAG:  Chronic pain of left knee  Muscle weakness (generalized)  Rationale for Evaluation and Treatment: Rehabilitation  ONSET  DATE: For many years   SUBJECTIVE:   SUBJECTIVE STATEMENT:  Pt reports no pain in the L knee at today's session. States she has done a lot of walking around [place] over the last few days without a significant increase in pain.   PERTINENT HISTORY: Pt reports chronic L knee> R knee pain. This pain began with insidious onset gradually over the last several years 2/2 to knee OA. Pain at anterior medial aspect of the L knee joint line and anterior lat aspect of R knee joint line but does not radiate down either LE. Pain described as stabbing and sharp. Denies radicular symptoms in LLE/ RLE along with joint crepitus, popping or clicking in the knee joint. When pt is having L knee pain, her aggravating factors include prolonged walking and ascending steps and the pain has been relieved with steroid injections, and OTC medications such as arthritis tylenol.   PAIN:  Are you having pain? No pain reported at today's session 0/10 Pain location: Medial aspect of anterior L knee Pain description: Sharp pain, No N/T noted Aggravating factors: Walking, ascending steps  Relieving factors: Injections, OTC Arthritis Tylenol  Worst pain: 7/10 when pt is experiencing L knee pain.   PRECAUTIONS: None  RED FLAGS: Bowel or bladder incontinence: No, Spinal  tumors: No, and Cauda equina syndrome: No   WEIGHT BEARING RESTRICTIONS: No  FALLS:  Has patient fallen in last 6 months? Yes. Number of falls 1   Pt reports falling after tripping over obstacle at church.   OCCUPATION: Retired   PLOF: Independent  PATIENT GOALS: To be pain free  NEXT MD VISIT: Three months w/ PCP    OBJECTIVE:   DIAGNOSTIC FINDINGS: N/A  PATIENT SURVEYS:  FOTO 86/79  COGNITION: Overall cognitive status: Within functional limits for tasks assessed     SENSATION: WFL  MUSCLE LENGTH: Bilateral Hamstring and quadriceps length WFL   POSTURE:  Trendelenburg gait noted w/ ambulation, along with mod eversion of RLE and  LLE.   PALPATION: TTP at anterior medial aspect of L knee joint line, and anterior lateral aspect of R knee joint line.   LOWER EXTREMITY ROM:  Active ROM Right eval Left eval  Hip flexion    Hip extension    Hip abduction    Hip adduction    Hip internal rotation    Hip external rotation    Knee flexion 134 132  Knee extension    Ankle dorsiflexion    Ankle plantarflexion    Ankle inversion    Ankle eversion     (Blank rows = not tested)  LOWER EXTREMITY MMT:  MMT Right eval Left eval  Hip flexion 4+ 4  Hip extension 4- 4-  Hip abduction 4- 4-  Hip adduction 4- 4-  Hip internal rotation 4+ 4+  Hip external rotation 4+ 4+  Knee flexion 4+ 4  Knee extension 4+ 4+  Ankle dorsiflexion 4+ 4+  Ankle plantarflexion 4 4  Ankle inversion    Ankle eversion     (Blank rows = not tested)  LOWER EXTREMITY SPECIAL TESTS:  Hip special tests: Ely's test: negative Knee special tests: Thessaly test: negative  FUNCTIONAL TESTS:  30 Second STS: 14 TUG: 6.21 seconds Squat assessment: Significant bilat knee valgus noted, no pain : 1,557ft with mild pain near the end   GAIT: Distance walked: 192ft Assistive device utilized: None Level of assistance: Complete Independence Comments: Pt notes increased bilat LE eversion, trendelenburg, increased bilat knee valgus   TODAY'S TREATMENT: DATE: 10/23/22  TherEx: Sidelying clamshells RLE/LLE w/ GTB, 2 x10/ each side Sidelying reverse clamshells RLE/LLE w/ GTB, 2 x10/ each side Standing mini squats w/ bilat UE support 2 x10  Standing hip extension RLE/ LLE w/ bilat UE 2 x10 Standing alternating lunges on step RLE/ LLE w/ 4# DB in bilat Ue's 2 x10/ each side Supine hip adductor squeeze 5 sec hold 2 x10  Supine hamstring stretch RLE/LLE 2 x30sec with strap  PATIENT EDUCATION:  Education details: HEP given to pt Person educated: Patient Education method: Medical illustrator Education comprehension: verbalized  understanding and returned demonstration  HOME EXERCISE PROGRAM:  Access Code: [email or web address] URL: [email or web address] Date: 10/23/2022 Prepared by: Ronnie Derby  Exercises - Clam with Resistance  - 1 x daily - 7 x weekly - 3 sets - 10 reps - Sidelying Reverse Clamshell with Resistance  - 1 x daily - 7 x weekly - 3 sets - 10 reps - Mini Squat with Counter Support  - 1 x daily - 7 x weekly - 3 sets - 10 reps - Standing Hip Extension with Unilateral Counter Support  - 1 x daily - 7 x weekly - 3 sets - 10 reps - Standard Lunge  - 1 x daily - 3-4 x weekly - 3  sets - 8 reps - Supine Hamstring Stretch with Strap  - 1 x daily - 7 x weekly - 1 sets - 3 reps - 30 hold - Supine Hip Adduction Isometric with Ball  - 1 x daily - 7 x weekly - 3 sets - 8 reps - 5 hold  ASSESSMENT:  CLINICAL IMPRESSION: Session focused on reviewing and updating pt's HEP along w/ administering the . Pt able to complete 1,535ft during the w/ only mild pain in the L knee towards the end of the activity. Pt notes improvements in bilat LE strength noted w/ increasing resistance band used for HEP exercises and updates will be reassessed next session. Pt will benefit from skilled PT services to address these deficits to improve bilat LE strengthening and maximize functional capacity with walking, ascending stairs, and household ADL's.    OBJECTIVE IMPAIRMENTS: Abnormal gait, decreased mobility, decreased strength, hypomobility, and pain.   ACTIVITY LIMITATIONS: squatting, stairs, and walking  PARTICIPATION LIMITATIONS: community activity  PERSONAL FACTORS: Age, Time since onset of injury/illness/exacerbation, and 1 comorbidity: knee OA  are also affecting patient's functional outcome.   REHAB POTENTIAL: Good  CLINICAL DECISION MAKING: Stable/uncomplicated  EVALUATION COMPLEXITY: Low   GOALS: Goals reviewed with patient? Yes  SHORT TERM GOALS: Target date: 10/30/22 Pt will be independent with  HEP to improve bilat LE strength and decrease pain with functional activities  Baseline: 10/09/22: HEP given to pt Goal status: INITIAL  LONG TERM GOALS: Target date: 11/20/22  Pt will complete five squats in a row, with correct form noting no valgus of bilat knees and 0/10 NPS to improve functional movement in assisting w/ ADL's. Baseline: 10/09/22: Increased bilat knee valgus noted when completing squat Goal status: INITIAL  2.  Pt will improve bilat hip ext and abduction strength to 5/5, to note improvement in functional strength and decreased trendelenburg w/ ambulation.  Baseline: 10/09/22: Trendelenburg gait noted w/ ambulation; bilat hip ext/ abd strength 4-.  Goal status: INITIAL  MMT Right Eval 9/9 Left Eval 9/9  Hip flexion 4+ 4  Hip extension 4- 4-  Hip abduction 4- 4-    3.  Pt will increase 6 MWT by > 165' to display improvements in functional endurance with community ambulation.             Baseline: 10/09/22: deferred to next session, 10/23/22: 1575ft  Goal status: INITIAL    PLAN:  PT FREQUENCY: 1x/week  PT DURATION: 6 weeks  PLANNED INTERVENTIONS: Therapeutic exercises, Therapeutic activity, Neuromuscular re-education, Balance training, Gait training, Patient/Family education, Self Care, Joint mobilization, Joint manipulation, Stair training, Cryotherapy, Moist heat, Manual therapy, and Re-evaluation   PLAN FOR NEXT SESSION: Reassess exercises added at last session. Continue to progress bilat LE (knee/hip) strengthening exercises   Marney Doctor, Student-PT  Delphia Grates. Fairly IV, PT, DPT Physical Therapist- [place]  [facility]  10/23/22, 12:48 PM

## 2022-10-24 DIAGNOSIS — M5414 Radiculopathy, thoracic region: Secondary | ICD-10-CM | POA: Diagnosis not present

## 2022-10-24 DIAGNOSIS — M542 Cervicalgia: Secondary | ICD-10-CM | POA: Diagnosis not present

## 2022-10-24 DIAGNOSIS — M9902 Segmental and somatic dysfunction of thoracic region: Secondary | ICD-10-CM | POA: Diagnosis not present

## 2022-10-24 DIAGNOSIS — M9901 Segmental and somatic dysfunction of cervical region: Secondary | ICD-10-CM | POA: Diagnosis not present

## 2022-10-25 ENCOUNTER — Encounter: Payer: Self-pay | Admitting: Family Medicine

## 2022-10-26 ENCOUNTER — Other Ambulatory Visit: Payer: Self-pay | Admitting: Family Medicine

## 2022-10-26 DIAGNOSIS — K649 Unspecified hemorrhoids: Secondary | ICD-10-CM

## 2022-10-26 MED ORDER — HYDROCORTISONE ACETATE 25 MG RE SUPP
25.0000 mg | Freq: Two times a day (BID) | RECTAL | 6 refills | Status: AC | PRN
Start: 2022-10-26 — End: ?

## 2022-10-27 ENCOUNTER — Ambulatory Visit: Payer: Medicare PPO | Admitting: Obstetrics

## 2022-10-27 ENCOUNTER — Encounter: Payer: Self-pay | Admitting: Obstetrics

## 2022-10-27 VITALS — BP 128/79 | HR 77 | Ht 65.55 in | Wt 240.8 lb

## 2022-10-27 DIAGNOSIS — R351 Nocturia: Secondary | ICD-10-CM | POA: Diagnosis not present

## 2022-10-27 DIAGNOSIS — N952 Postmenopausal atrophic vaginitis: Secondary | ICD-10-CM | POA: Diagnosis not present

## 2022-10-27 DIAGNOSIS — N3946 Mixed incontinence: Secondary | ICD-10-CM | POA: Diagnosis not present

## 2022-10-27 LAB — POCT URINALYSIS DIPSTICK
Appearance: NORMAL
Bilirubin, UA: NEGATIVE
Blood, UA: NEGATIVE
Glucose, UA: NEGATIVE
Ketones, UA: NEGATIVE
Leukocytes, UA: NEGATIVE
Nitrite, UA: NEGATIVE
Odor: NEGATIVE
Protein, UA: NEGATIVE
Spec Grav, UA: 1.02 (ref 1.010–1.025)
Urobilinogen, UA: 0.2 U/dL
pH, UA: 6 (ref 5.0–8.0)

## 2022-10-27 MED ORDER — ESTRADIOL 0.1 MG/GM VA CREA
0.5000 g | TOPICAL_CREAM | VAGINAL | 3 refills | Status: DC
Start: 2022-10-30 — End: 2023-04-02

## 2022-10-27 MED ORDER — GEMTESA 75 MG PO TABS
75.0000 mg | ORAL_TABLET | Freq: Every day | ORAL | Status: DC
Start: 2022-10-27 — End: 2022-11-27

## 2022-10-27 MED ORDER — GEMTESA 75 MG PO TABS
75.0000 mg | ORAL_TABLET | Freq: Every day | ORAL | 2 refills | Status: DC
Start: 2022-10-27 — End: 2023-02-08

## 2022-10-27 NOTE — Assessment & Plan Note (Signed)
-   denies OSA or snoring - advised to avoid night time fluid intake - encouraged optimization of glycemic control

## 2022-10-27 NOTE — Assessment & Plan Note (Addendum)
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  While we do not know the exact etiology of OAB, several treatment options exist. We discussed management including behavioral therapy (decreasing bladder irritants, urge suppression strategies, timed voids, bladder retraining), physical therapy, medication; for refractory cases posterior tibial nerve stimulation, sacral neuromodulation, and intravesical botulinum toxin injection.  For anticholinergic medications, we discussed the potential side effects of anticholinergics including dry eyes, dry mouth, constipation, cognitive impairment and urinary retention. For Beta-3 agonist medication, we discussed the potential side effect of elevated blood pressure which is more likely to occur in individuals with uncontrolled hypertension. - Samples provided for [facility] and Rx sent, consider 90 day Rx vs. mirabegron Rx or trospium if cost prohibitive - For treatment of stress urinary incontinence,  non-surgical options include expectant management, weight loss, physical therapy, as well as a pessary.  Surgical options include a midurethral sling, Burch urethropexy, and transurethral injection of a bulking agent. Consider periurethral bulking vs. repeat retropubic midurethral sling - encouraged weight loss and overall wellbeing with support group for dementia caregivers due to stress eating  - maintaining glycemic control - start vaginal estrogen

## 2022-10-27 NOTE — Patient Instructions (Signed)
We discussed the symptoms of overactive bladder (OAB), which include urinary urgency, urinary frequency, night-time urination, with or without urge incontinence.  We discussed management including behavioral therapy (decreasing bladder irritants by following a bladder diet, urge suppression strategies, timed voids, bladder retraining), physical therapy, medication; and for refractory cases posterior tibial nerve stimulation, sacral neuromodulation, and intravesical botulinum toxin injection.   For Beta-3 agonist medication, we discussed the potential side effect of elevated blood pressure which is more likely to occur in individuals with uncontrolled hypertension. You were given samples for Gemtesa 75 mg.  It can take a month to start working so give it time, but if you have bothersome side effects call sooner and we can try a different medication.  Call us if you have trouble filling the prescription or if it's not covered by your insurance.  For treatment of stress urinary incontinence, which is leakage with physical activity/movement/strainging/coughing, we discussed expectant management versus nonsurgical options versus surgery. Nonsurgical options include weight loss, physical therapy, as well as a pessary.  Surgical options include a midurethral sling, which is a synthetic mesh sling that acts like a hammock under the urethra to prevent leakage of urine, a Burch urethropexy, and transurethral injection of a bulking agent.  For vaginal atrophy (thinning of the vaginal tissue that can cause dryness and burning) and UTI prevention we discussed estrogen replacement in the form of vaginal cream.   Start vaginal estrogen therapy nightly for two weeks then 2 times weekly at night. This can be placed with your finger or an applicator inside the vagina and around the urethra.  Please let us know if the prescription is too expensive and we can look for alternative options.   Is vaginal estrogen therapy safe  for me? Vaginal estrogen preparations act on the vaginal skin, and only a very tiny amount is absorbed into the bloodstream (0.01%).  They work in a similar way to hand or face cream.  There is minimal absorption and they are therefore perfectly safe. If you have had breast cancer and have persistent troublesome symptoms which aren't settling with vaginal moisturisers and lubricants, local estrogen treatment may be a possibility, but consultation with your oncologist should take place first.   Start vaginal estrogen therapy nightly for two weeks then 2 times weekly at night for treatment of vaginal atrophy (dryness of the vaginal tissues).  Please let us know if the prescription is too expensive and we can look for alternative options.

## 2022-10-27 NOTE — Progress Notes (Signed)
Urogynecology New Patient Evaluation and Consultation  Referring Provider: Erasmo Downer, MD PCP: Erasmo Downer, MD Date of Service: 10/27/2022  SUBJECTIVE Chief Complaint: New Patient (Initial Visit)  History of Present Illness: Erica Pineda is a 72 y.o. White or Caucasian female seen in consultation at the request of Dr. Beryle Flock for evaluation of mixed urinary incontinence and pelvic organ prolapse.  History of bladder surgery for urinary incontinence (unclear if it involved mesh) at the time of tubal ligation with liposuction in 08/21/1987 by Dr. Earnest Conroy in [place], [place]. Denies complications and reports resolution of leakage until around 3-4 years, retired in 2018 after COVID. Previously worked as a Warden/ranger.  Reports weight gain 12lb in the past 12-14 months, started to have more difficulty when her husband started to exhibit symptoms of dementia around 6 years ago.  Review of records significant for: Type 2 diabetes with HbA1C 6.4 on 10/04/22 Undergoing PT for knee pain  Urinary Symptoms: Leaks urine with cough/ sneeze, laughing, exercise, with a full bladder, with movement to the bathroom, and with urgency, leaks when she thinks about voiding Leaks 4-6 time(s) per days with urgency, SUI 2-3/week with exercise managed by avoiding jumping Pad use:  0  pads per day, changes underwear 2-3x/month Patient is bothered by UI symptoms.  Day time voids 12-15.  Nocturia: 1-2 times per night to void with history of insomnia on trazodone Denies snoring, reports fluid intake at night due to water for thyroid medication s/p thyroid cancer treatment Voiding dysfunction:  empties bladder well.  Patient does not use a catheter to empty bladder.  When urinating, patient feels she has no difficulties Drinks: >64oz per day, 1 cup of black tea in the morning  UTIs: 1 UTI's in 07/2022.  Reports 2 episodes of UTI symptoms resolved after OTC medications Denies history of blood  in urine, kidney or bladder stones, pyelonephritis, bladder cancer, and kidney cancer  Pelvic Organ Prolapse Symptoms:                  Patient Denies a feeling of a bulge the vaginal area.   Bowel Symptom: Bowel movements: 1-2 time(s) per day Stool consistency: soft  Straining: no.  Splinting: no.  Incomplete evacuation: no.  Patient Denies accidental bowel leakage / fecal incontinence Bowel regimen: none History of hemorrhoids HM Colonoscopy          Colonoscopy (Every 10 Years) Next due on 01/30/2027    01/29/2017  HM Colonoscopy component of HM COLONOSCOPY   Only the first 1 history entries have been loaded, but more history exists.  Dr. [facility], due in 10 years per patient         Sexual Function Sexually active: yes.  Sexual orientation: Straight Pain with sex: Yes, has discomfort due to dryness  Pelvic Pain Denies pelvic pain  Past Medical History:  Past Medical History:  Diagnosis Date   Allergy    Arthritis    thumbs   Cancer (HCC)    Thyroid-follwed by Endocrine   Cataract    surgery 2021 (?)   Diabetes mellitus without complication (HCC)    I'm not sure what mellitus is   Hyperlipidemia     Past Surgical History:   Past Surgical History:  Procedure Laterality Date   ABDOMINAL HYSTERECTOMY     APPENDECTOMY  1996   BUNIONECTOMY  05/2007   and hammer-toe   CATARACT EXTRACTION Bilateral    COSMETIC SURGERY  2024   eyelids  EYE SURGERY  2021 (?)   cataracts   HERNIA REPAIR Left 1993   with mesh   HYMENECTOMY  1971   INCONTINENCE SURGERY  1989   TONSILLECTOMY AND ADENOIDECTOMY  1972   TOTAL THYROIDECTOMY  02/08/2009   TUBAL LIGATION  1989   after 3rd pregnancy  Left sided inguinal hernia repair with mesh  Past OB/GYN History: [id number] Vaginal deliveries: 3,  Forceps/ Vacuum deliveries: 0, Cesarean section: 0 Menopausal: Yes, status post hysterectomy in 1996 Contraception: BTL in 1989. Last pap smear was after hysterectomy with  normal findings around 5 years ago.  Any history of abnormal pap smears: no.  Medications: Patient has a current medication list which includes the following prescription(s): aspirin ec, biotin, cetirizine hcl, vitamin d3, coenzyme q10, [START ON 10/30/2022] estradiol, ezetimibe, hydrocortisone, levothyroxine, rosuvastatin, trazodone, gemtesa, gemtesa, vitamin d (ergocalciferol), azelaic acid, and econazole nitrate.   Allergies: Patient is allergic to percodan  [oxycodone-aspirin] and simvastatin.   Social History:  Social History   Tobacco Use   Smoking status: Never   Smokeless tobacco: Never  Vaping Use   Vaping status: Never Used  Substance Use Topics   Alcohol use: Yes    Alcohol/week: 1.0 - 2.0 standard drink of alcohol    Types: 1 - 2 Glasses of wine per week    Comment: occasional   Drug use: No    Relationship status: married Patient lives with her husband with Alzheimer's and poorly controlled diabetes.   Patient is not employed. Regular exercise: No History of abuse: No  Family History:   Family History  Problem Relation Age of Onset   Breast cancer Sister    Diabetes Sister    Osteoporosis Mother    Stroke Mother        in 2014   Alzheimer's disease Father    Diabetes Father    Varicose Veins Father    Diabetes Sister    Breast cancer Maternal Aunt    Hypertension Other    Hyperlipidemia Other    Heart disease Other    Diabetes Other    Alzheimer's disease Other      Review of Systems: Review of Systems  Constitutional:  Negative for fever, malaise/fatigue and weight loss.  Respiratory:  Negative for cough, shortness of breath and wheezing.   Cardiovascular:  Negative for chest pain, palpitations and leg swelling.  Gastrointestinal:  Negative for abdominal pain and blood in stool.  Genitourinary:  Positive for frequency and urgency. Negative for hematuria.       Leakage, vaginal dryness  Skin:  Negative for rash.  Neurological:  Negative for  dizziness, weakness and headaches.  Endo/Heme/Allergies:  Bruises/bleeds easily.  Psychiatric/Behavioral:  Negative for depression. The patient is not nervous/anxious.    OBJECTIVE Physical Exam: Vitals:   10/27/22 1309  BP: 128/79  Pulse: 77  Weight: 240 lb 12.8 oz (109.2 kg)  Height: 5' 5.55" (1.665 m)    Physical Exam Vitals reviewed. Exam conducted with a chaperone present.  Constitutional:      General: She is not in acute distress. Cardiovascular:     Rate and Rhythm: Normal rate.  Pulmonary:     Effort: Pulmonary effort is normal. No respiratory distress.  Abdominal:     General: There is no distension.     Palpations: Abdomen is soft.     Tenderness: There is no abdominal tenderness.    Genitourinary:    Labia:        Right: No rash, tenderness, lesion  or injury.        Left: No rash, tenderness, lesion or injury.      Urethra: No prolapse, urethral pain, urethral swelling or urethral lesion.     Vagina: No signs of injury and foreign body. Prolapsed vaginal walls present. No vaginal discharge, erythema, tenderness, bleeding or lesions.     Uterus: Absent.      Adnexa:        Right: No mass, tenderness or fullness.         Left: No mass, tenderness or fullness.       Comments: Exam consistent with history of midurethral sling palpated under vaginal epithelium in vaginal sulci bilaterally. No mesh erosion noted Neurological:     Mental Status: She is alert.   GU / Detailed Urogynecologic Evaluation:  Pelvic Exam: Normal external female genitalia; Bartholin's and Skene's glands normal in appearance; urethral meatus normal in appearance, no urethral masses or discharge.   CST: negative  Reflexes: bulbocavernosis present, anocutaneous present bilaterally.  s/p hysterectomy: Speculum exam reveals normal vaginal mucosa with  atrophy and normal vaginal cuff.  Adnexa no mass, fullness, tenderness.    Pelvic floor strength IV/V, puborectalis IV/V   Pelvic floor  musculature: Right levator non-tender, Right obturator non-tender, Left levator non-tender, Left obturator non-tender  POP-Q:   POP-Q  -2                                            Aa   -2                                           Ba  -7                                              C   1                                            Gh  3                                            Pb  7                                            tvl   -3                                            Ap  -3                                            Bp  D    Post-Void Residual (PVR) by Bladder Scan: In order to evaluate bladder emptying, we discussed obtaining a postvoid residual and patient agreed to this procedure.  Procedure: The ultrasound unit was placed on the patient's abdomen in the suprapubic region after the patient had voided.    Post Void Residual - 10/27/22 1325       Post Void Residual   Post Void Residual 16 mL            Laboratory Results: Lab Results  Component Value Date   COLORU Dark Yellow 10/27/2022   CLARITYU Hazy 10/27/2022   GLUCOSEUR Negative 10/27/2022   BILIRUBINUR Negative 10/27/2022   KETONESU Negative 10/27/2022   SPECGRAV 1.020 10/27/2022   RBCUR Negative 10/27/2022   PHUR 6.0 10/27/2022   PROTEINUR Negative 10/27/2022   UROBILINOGEN 0.2 10/27/2022   LEUKOCYTESUR Negative 10/27/2022    Lab Results  Component Value Date   CREATININE 0.85 10/04/2022   CREATININE 0.81 06/12/2022   CREATININE 0.72 12/16/2021    Lab Results  Component Value Date   HGBA1C 6.4 (H) 10/04/2022    Lab Results  Component Value Date   HGB 13.8 08/22/2021     ASSESSMENT AND PLAN Ms. Carles is a 72 y.o. with:  1. Urinary incontinence, mixed   2. Nocturia   3. Vaginal atrophy     Urinary incontinence, mixed Assessment & Plan: - POCT UA negative, bladder scan 16mL WNL - urgency > stress urinary incontinence -  history of bladder surgery for incontinence at the time of BTL, exam consistent with midurethral sling with recurrent SUI managed by avoiding certain activities during exercise - We discussed the symptoms of overactive bladder (OAB), which include urinary urgency, urinary frequency, nocturia, with or without urge incontinence.  While we do not know the exact etiology of OAB, several treatment options exist. We discussed management including behavioral therapy (decreasing bladder irritants, urge suppression strategies, timed voids, bladder retraining), physical therapy, medication; for refractory cases posterior tibial nerve stimulation, sacral neuromodulation, and intravesical botulinum toxin injection.  For anticholinergic medications, we discussed the potential side effects of anticholinergics including dry eyes, dry mouth, constipation, cognitive impairment and urinary retention. For Beta-3 agonist medication, we discussed the potential side effect of elevated blood pressure which is more likely to occur in individuals with uncontrolled hypertension. - Samples provided for [facility] and Rx sent, consider 90 day Rx vs. mirabegron Rx or trospium if cost prohibitive - For treatment of stress urinary incontinence,  non-surgical options include expectant management, weight loss, physical therapy, as well as a pessary.  Surgical options include a midurethral sling, Burch urethropexy, and transurethral injection of a bulking agent. Consider periurethral bulking vs. repeat retropubic midurethral sling - encouraged weight loss and overall wellbeing with support group for dementia caregivers due to stress eating  - maintaining glycemic control - start vaginal estrogen  Orders: -     POCT urinalysis dipstick  Nocturia Assessment & Plan: - denies OSA or snoring - advised to avoid night time fluid intake - encouraged optimization of glycemic control   Vaginal atrophy Assessment & Plan: - reports vaginal  dryness noted with discomfort during intercourse - We discussed the potential risks associated with hormone replacement including stroke, heart attack, and blood clots; and the fact that these risks are very low with vaginal estrogen use due to the very low systemic absorption rate of ~ 0.01% with a twice-week regimen. - Rx estrace to start - discussed association with UTI and urinary symptoms  Other orders -     Leslye Peer; Take 1 tablet (75 mg total) by mouth daily.  Dispense: 30 tablet; Refill: 2 -     Gemtesa; Take 1 tablet (75 mg total) by mouth daily.  Dispense: 28 tablet -     Estradiol; Place 0.5 g vaginally 2 (two) times a week. Place 0.5g nightly for two weeks then twice a week after  Dispense: 42 g; Refill: 3   Loleta Chance, MD  Time spent: I spent 65 minutes dedicated to the care of this patient on the date of this encounter to include pre-visit review of records, face-to-face time with the patient discussing mixed urinary incontinence, nocturia, vaginal atrophy treatments, and post visit documentation and ordering medication.

## 2022-10-27 NOTE — Assessment & Plan Note (Signed)
-   reports vaginal dryness noted with discomfort during intercourse - We discussed the potential risks associated with hormone replacement including stroke, heart attack, and blood clots; and the fact that these risks are very low with vaginal estrogen use due to the very low systemic absorption rate of ~ 0.01% with a twice-week regimen. - Rx estrace to start - discussed association with UTI and urinary symptoms

## 2022-10-30 ENCOUNTER — Ambulatory Visit: Payer: Medicare PPO

## 2022-11-08 DIAGNOSIS — M542 Cervicalgia: Secondary | ICD-10-CM | POA: Diagnosis not present

## 2022-11-08 DIAGNOSIS — M9901 Segmental and somatic dysfunction of cervical region: Secondary | ICD-10-CM | POA: Diagnosis not present

## 2022-11-08 DIAGNOSIS — M9902 Segmental and somatic dysfunction of thoracic region: Secondary | ICD-10-CM | POA: Diagnosis not present

## 2022-11-08 DIAGNOSIS — M5414 Radiculopathy, thoracic region: Secondary | ICD-10-CM | POA: Diagnosis not present

## 2022-11-10 ENCOUNTER — Ambulatory Visit: Payer: Medicare PPO

## 2022-11-21 ENCOUNTER — Ambulatory Visit: Payer: Medicare PPO | Admitting: Dermatology

## 2022-11-21 DIAGNOSIS — L821 Other seborrheic keratosis: Secondary | ICD-10-CM | POA: Diagnosis not present

## 2022-11-21 DIAGNOSIS — D1801 Hemangioma of skin and subcutaneous tissue: Secondary | ICD-10-CM

## 2022-11-21 DIAGNOSIS — I8393 Asymptomatic varicose veins of bilateral lower extremities: Secondary | ICD-10-CM

## 2022-11-21 DIAGNOSIS — L738 Other specified follicular disorders: Secondary | ICD-10-CM

## 2022-11-21 DIAGNOSIS — Z7189 Other specified counseling: Secondary | ICD-10-CM

## 2022-11-21 DIAGNOSIS — W908XXA Exposure to other nonionizing radiation, initial encounter: Secondary | ICD-10-CM

## 2022-11-21 DIAGNOSIS — I781 Nevus, non-neoplastic: Secondary | ICD-10-CM

## 2022-11-21 DIAGNOSIS — Z1283 Encounter for screening for malignant neoplasm of skin: Secondary | ICD-10-CM | POA: Diagnosis not present

## 2022-11-21 DIAGNOSIS — I878 Other specified disorders of veins: Secondary | ICD-10-CM

## 2022-11-21 DIAGNOSIS — L814 Other melanin hyperpigmentation: Secondary | ICD-10-CM

## 2022-11-21 DIAGNOSIS — L719 Rosacea, unspecified: Secondary | ICD-10-CM | POA: Diagnosis not present

## 2022-11-21 DIAGNOSIS — L720 Epidermal cyst: Secondary | ICD-10-CM

## 2022-11-21 DIAGNOSIS — R238 Other skin changes: Secondary | ICD-10-CM

## 2022-11-21 DIAGNOSIS — D229 Melanocytic nevi, unspecified: Secondary | ICD-10-CM

## 2022-11-21 DIAGNOSIS — L729 Follicular cyst of the skin and subcutaneous tissue, unspecified: Secondary | ICD-10-CM

## 2022-11-21 DIAGNOSIS — L578 Other skin changes due to chronic exposure to nonionizing radiation: Secondary | ICD-10-CM

## 2022-11-21 NOTE — Patient Instructions (Addendum)
To treat redness and broken blood vessels at face  Counseling for BBL / IPL / Laser and Coordination of Care Discussed the treatment option of Broad Band Light (BBL) /Intense Pulsed Light (IPL)/ Laser for skin discoloration, including brown spots and redness.  Typically we recommend at least 1-3 treatment sessions about 5-8 weeks apart for best results.  Cannot have tanned skin when BBL performed, and regular use of sunscreen/photoprotection is advised after the procedure to help maintain results. The patient's condition may also require "maintenance treatments" in the future.  The fee for BBL / laser treatments is $350 per treatment session for the whole face.  A fee can be quoted for other parts of the body.  Insurance typically does not pay for BBL/laser treatments and therefore the fee is an out-of-pocket cost. Recommend prophylactic valtrex treatment. Once scheduled for procedure, will send Rx in prior to patient's appointment.     Seborrheic Keratosis  What causes seborrheic keratoses? Seborrheic keratoses are harmless, common skin growths that first appear during adult life.  As time goes by, more growths appear.  Some people may develop a large number of them.  Seborrheic keratoses appear on both covered and uncovered body parts.  They are not caused by sunlight.  The tendency to develop seborrheic keratoses can be inherited.  They vary in color from skin-colored to gray, brown, or even black.  They can be either smooth or have a rough, warty surface.   Seborrheic keratoses are superficial and look as if they were stuck on the skin.  Under the microscope this type of keratosis looks like layers upon layers of skin.  That is why at times the top layer may seem to fall off, but the rest of the growth remains and re-grows.    Treatment Seborrheic keratoses do not need to be treated, but can easily be removed in the office.  Seborrheic keratoses often cause symptoms when they rub on clothing or  jewelry.  Lesions can be in the way of shaving.  If they become inflamed, they can cause itching, soreness, or burning.  Removal of a seborrheic keratosis can be accomplished by freezing, burning, or surgery. If any spot bleeds, scabs, or grows rapidly, please return to have it checked, as these can be an indication of a skin cancer.   Melanoma ABCDEs  Melanoma is the most dangerous type of skin cancer, and is the leading cause of death from skin disease.  You are more likely to develop melanoma if you: Have light-colored skin, light-colored eyes, or red or blond hair Spend a lot of time in the sun Tan regularly, either outdoors or in a tanning bed Have had blistering sunburns, especially during childhood Have a close family member who has had a melanoma Have atypical moles or large birthmarks  Early detection of melanoma is key since treatment is typically straightforward and cure rates are extremely high if we catch it early.   The first sign of melanoma is often a change in a mole or a new dark spot.  The ABCDE system is a way of remembering the signs of melanoma.  A for asymmetry:  The two halves do not match. B for border:  The edges of the growth are irregular. C for color:  A mixture of colors are present instead of an even brown color. D for diameter:  Melanomas are usually (but not always) greater than 6mm - the size of a pencil eraser. E for evolution:  The spot keeps  changing in size, shape, and color.  Please check your skin once per month between visits. You can use a small mirror in front and a large mirror behind you to keep an eye on the back side or your body.   If you see any new or changing lesions before your next follow-up, please call to schedule a visit.  Please continue daily skin protection including broad spectrum sunscreen SPF 30+ to sun-exposed areas, reapplying every 2 hours as needed when you're outdoors.   Staying in the shade or wearing long sleeves, sun  glasses (UVA+UVB protection) and wide brim hats (4-inch brim around the entire circumference of the hat) are also recommended for sun protection.     Due to recent changes in healthcare laws, you may see results of your pathology and/or laboratory studies on MyChart before the doctors have had a chance to review them. We understand that in some cases there may be results that are confusing or concerning to you. Please understand that not all results are received at the same time and often the doctors may need to interpret multiple results in order to provide you with the best plan of care or course of treatment. Therefore, we ask that you please give [place] 2 business days to thoroughly review all your results before contacting the office for clarification. Should we see a critical lab result, you will be contacted sooner.   If You Need Anything After Your Visit  If you have any questions or concerns for your doctor, please call our main line at [phone] and press option 4 to reach your doctor's medical assistant. If no one answers, please leave a voicemail as directed and we will return your call as soon as possible. Messages left after 4 pm will be answered the following business day.   You may also send [place] a message via MyChart. We typically respond to MyChart messages within 1-2 business days.  For prescription refills, please ask your pharmacy to contact our office. Our fax number is [phone].  If you have an urgent issue when the clinic is closed that cannot wait until the next business day, you can page your doctor at the number below.    Please note that while we do our best to be available for urgent issues outside of office hours, we are not available 24/7.   If you have an urgent issue and are unable to reach [place], you may choose to seek medical care at your doctor's office, retail clinic, urgent care center, or emergency room.  If you have a medical emergency, please immediately  call 911 or go to the emergency department.  Pager Numbers  - Dr. Gwen Pounds: [phone]  - Dr. Roseanne Reno: [phone]  - Dr. Katrinka Blazing: [phone]   In the event of inclement weather, please call our main line at [phone] for an update on the status of any delays or closures.  Dermatology Medication Tips: Please keep the boxes that topical medications come in in order to help keep track of the instructions about where and how to use these. Pharmacies typically print the medication instructions only on the boxes and not directly on the medication tubes.   If your medication is too expensive, please contact our office at [phone] option 4 or send [place] a message through MyChart.   We are unable to tell what your co-pay for medications will be in advance as this is different depending on your insurance coverage. However, we may be able to find a  substitute medication at lower cost or fill out paperwork to get insurance to cover a needed medication.   If a prior authorization is required to get your medication covered by your insurance company, please allow [place] 1-2 business days to complete this process.  Drug prices often vary depending on where the prescription is filled and some pharmacies may offer cheaper prices.  The website www.goodrx.com contains coupons for medications through different pharmacies. The prices here do not account for what the cost may be with help from insurance (it may be cheaper with your insurance), but the website can give you the price if you did not use any insurance.  - You can print the associated coupon and take it with your prescription to the pharmacy.  - You may also stop by our office during regular business hours and pick up a GoodRx coupon card.  - If you need your prescription sent electronically to a different pharmacy, notify our office through [facility] or by phone at [phone] option 4.     Si Usted Necesita Algo Despus de Su  Visita  Tambin puede enviarnos un mensaje a travs de Clinical cytogeneticist. Por lo general respondemos a los mensajes de MyChart en el transcurso de 1 a 2 das hbiles.  Para renovar recetas, por favor pida a su farmacia que se ponga en contacto con nuestra oficina. Annie Sable de fax es [place] [phone].  Si tiene un asunto urgente cuando la clnica est cerrada y que no puede esperar hasta el siguiente da hbil, puede llamar/localizar a su doctor(a) al nmero que aparece a continuacin.   Por favor, tenga en cuenta que aunque hacemos todo lo posible para estar disponibles para asuntos urgentes fuera del horario de [place], no estamos disponibles las 24 horas del da, los 7 [address]  [address] de la [place].   Si tiene un problema urgente y no puede comunicarse con nosotros, puede optar por buscar atencin mdica  en el consultorio de su doctor(a), en una clnica privada, en un centro de atencin urgente o en una sala de emergencias.  Si tiene Engineer, drilling, por favor llame inmediatamente al 911 o vaya a la sala de emergencias.  Nmeros de bper  - Dr. Gwen Pounds: [phone]  - Dra. Roseanne Reno: [phone]  - Dr. Katrinka Blazing: [phone]   En caso de inclemencias del tiempo, por favor llame a Lacy Duverney principal al [phone] para una actualizacin sobre el [place] de cualquier retraso o cierre.  Consejos para la medicacin en dermatologa: Por favor, guarde las cajas en las que vienen los medicamentos de uso tpico para ayudarle a seguir las instrucciones sobre dnde y cmo usarlos. Las farmacias generalmente imprimen las instrucciones del medicamento slo en las cajas y no directamente en los tubos del [place].   Si su medicamento es muy caro, por favor, pngase en contacto con Rolm Gala llamando al [phone] y presione la opcin 4 o envenos un mensaje a travs de Clinical cytogeneticist.   No podemos decirle cul ser su copago por los medicamentos por adelantado ya que esto es diferente dependiendo de  la cobertura de su seguro. Sin embargo, es posible que podamos encontrar un medicamento sustituto a Audiological scientist un formulario para que el seguro cubra el medicamento que se considera necesario.   Si se requiere una autorizacin previa para que su compaa de seguros [place] su medicamento, por favor permtanos de 1 a 2 das hbiles para completar [address]  Los precios de los medicamentos varan con frecuencia dependiendo del  lugar de dnde se surte la receta y alguna farmacias pueden ofrecer precios ms baratos.  El sitio web www.goodrx.com tiene cupones para medicamentos de Health and safety inspector. Los precios aqu no tienen en cuenta lo que podra costar con la ayuda del seguro (puede ser ms barato con su seguro), pero el sitio web puede darle el precio si no utiliz Tourist information centre manager.  - Puede imprimir el cupn correspondiente y llevarlo con su receta a la farmacia.  - Tambin puede pasar por nuestra oficina durante el horario de atencin regular y Education officer, museum una tarjeta de cupones de GoodRx.  - Si necesita que su receta se enve electrnicamente a una farmacia diferente, informe a nuestra oficina a travs de MyChart de [place] o por telfono llamando al [phone] y presione la opcin 4.

## 2022-11-21 NOTE — Progress Notes (Signed)
Follow-Up Visit   Subjective  Erica Pineda is a 72 y.o. female who presents for the following: Skin Cancer Screening and Full Body Skin Exam hx of rosacea, hx of isk, hx of venous lake. Reports some itchy areas at left back near bra strap.  Hx of rosacea, not currently taking doxycycline or using azelaic acid gel due to recent eyelid surgery in March. Using an OTC oil recommended by her eye surgeon for her eyelids and face.  Has helped more than anything else.   The patient presents for Total-Body Skin Exam (TBSE) for skin cancer screening and mole check. The patient has spots, moles and lesions to be evaluated, some may be new or changing and the patient may have concern these could be cancer.    The following portions of the chart were reviewed this encounter and updated as appropriate: medications, allergies, medical history  Review of Systems:  No other skin or systemic complaints except as noted in HPI or Assessment and Plan.  Objective  Well appearing patient in no apparent distress; mood and affect are within normal limits.  A full examination was performed including scalp, head, eyes, ears, nose, lips, neck, chest, axillae, abdomen, back, buttocks, bilateral upper extremities, bilateral lower extremities, hands, feet, fingers, toes, fingernails, and toenails. All findings within normal limits unless otherwise noted below.   Relevant physical exam findings are noted in the Assessment and Plan.    Assessment & Plan   SKIN CANCER SCREENING PERFORMED TODAY.  ACTINIC DAMAGE - Chronic condition, secondary to cumulative UV/sun exposure - diffuse scaly erythematous macules with underlying dyspigmentation - Recommend daily broad spectrum sunscreen SPF 30+ to sun-exposed areas, reapply every 2 hours as needed.  - Staying in the shade or wearing long sleeves, sun glasses (UVA+UVB protection) and wide brim hats (4-inch brim around the entire circumference of the hat) are also recommended  for sun protection.  - Call for new or changing lesions.  LENTIGINES, SEBORRHEIC KERATOSES, HEMANGIOMAS - Benign normal skin lesions, including Sks at back, left flank, and left shoulder with pruritus (pt declines LN2) - Benign-appearing - Call for any changes   EPIDERMAL INCLUSION CYST Exam: Subcutaneous nodule at lower sternum  Benign-appearing. Exam most consistent with an epidermal inclusion cyst. Discussed that a cyst is a benign growth that can grow over time and sometimes get irritated or inflamed. Recommend observation if it is not bothersome. Discussed option of surgical excision to remove it if it is growing, symptomatic, or other changes noted. Please call for new or changing lesions so they can be evaluated.   MELANOCYTIC NEVI - Tan-brown and/or pink-flesh-colored symmetric macules and papules - Benign appearing on exam today - Observation - Call clinic for new or changing moles - Recommend daily use of broad spectrum spf 30+ sunscreen to sun-exposed areas.   Sebaceous Hyperplasia face - Small yellow papules with a central dell - Benign-appearing - Observe. Call for changes.  Varicose Veins/Spider Veins - Dilated blue, purple or red veins at the lower extremities - Reassured - Smaller vessels can be treated by sclerotherapy (a procedure to inject a medicine into the veins to make them disappear) if desired, but the treatment is not covered by insurance. Larger vessels may be covered if symptomatic and we would refer to vascular surgeon if treatment desired.   Rosacea mid face, nose   Exam: erythema with telangectasia   Chronic and persistent condition with duration or expected duration over one year. Condition is improving with treatment but not  currently at goal.    Rosacea is a chronic progressive skin condition usually affecting the face of adults, causing redness and/or acne bumps. It is treatable but not curable. It sometimes affects the eyes (ocular rosacea)  as well. It may respond to topical and/or systemic medication and can flare with stress, sun exposure, alcohol, exercise and some foods.  Daily application of broad spectrum spf 30+ sunscreen to face is recommended to reduce flares.   D/c  azelaic acid 15 % gel  D/c doxycycline 20 mg tablet (ocular symptoms improved) Continue topical oil recommended by eye surgeon   Doxycycline should be taken with food to prevent nausea. Do not lay down for 30 minutes after taking. Be cautious with sun exposure and use good sun protection while on this medication. Pregnant women should not take this medication.   Discussed BBL to treat telangiectasias and redness   Counseling for BBL / IPL / Laser and Coordination of Care Discussed the treatment option of Broad Band Light (BBL) /Intense Pulsed Light (IPL)/ Laser for skin discoloration, including brown spots and redness.  Typically we recommend at least 1-3 treatment sessions about 5-8 weeks apart for best results.  Cannot have tanned skin when BBL performed, and regular use of sunscreen/photoprotection is advised after the procedure to help maintain results. The patient's condition may also require "maintenance treatments" in the future.  The fee for BBL / laser treatments is $350 per treatment session for the whole face.  A fee can be quoted for other parts of the body.  Insurance typically does not pay for BBL/laser treatments and therefore the fee is an out-of-pocket cost. Recommend prophylactic valtrex treatment. Once scheduled for procedure, will send Rx in prior to patient's appointment.    Venous lake right lower lip x 2  Exam:6.0 mm soft purple papule and adjacent 0.2 cm soft purple papule    Benign, observe.    Return in about 1 year (around 11/21/2023) for TBSE.  I, Asher Muir, CMA, am acting as scribe for Willeen Niece, MD.   Documentation: I have reviewed the above documentation for accuracy and completeness, and I agree with the  above.  Willeen Niece, MD

## 2022-11-22 DIAGNOSIS — Z6837 Body mass index (BMI) 37.0-37.9, adult: Secondary | ICD-10-CM | POA: Diagnosis not present

## 2022-11-22 DIAGNOSIS — E66812 Obesity, class 2: Secondary | ICD-10-CM | POA: Diagnosis not present

## 2022-11-22 DIAGNOSIS — E119 Type 2 diabetes mellitus without complications: Secondary | ICD-10-CM | POA: Diagnosis not present

## 2022-11-22 DIAGNOSIS — E890 Postprocedural hypothyroidism: Secondary | ICD-10-CM | POA: Diagnosis not present

## 2022-11-22 DIAGNOSIS — C73 Malignant neoplasm of thyroid gland: Secondary | ICD-10-CM | POA: Diagnosis not present

## 2022-11-23 ENCOUNTER — Encounter: Payer: Self-pay | Admitting: Family Medicine

## 2022-11-23 DIAGNOSIS — Z1231 Encounter for screening mammogram for malignant neoplasm of breast: Secondary | ICD-10-CM | POA: Diagnosis not present

## 2022-11-23 LAB — HM MAMMOGRAPHY

## 2022-11-24 ENCOUNTER — Ambulatory Visit: Payer: Medicare PPO

## 2022-11-27 ENCOUNTER — Encounter: Payer: Self-pay | Admitting: Family Medicine

## 2022-11-27 ENCOUNTER — Ambulatory Visit: Payer: Medicare PPO | Admitting: Family Medicine

## 2022-11-27 DIAGNOSIS — N3946 Mixed incontinence: Secondary | ICD-10-CM

## 2022-11-27 DIAGNOSIS — M9901 Segmental and somatic dysfunction of cervical region: Secondary | ICD-10-CM | POA: Diagnosis not present

## 2022-11-27 DIAGNOSIS — M5414 Radiculopathy, thoracic region: Secondary | ICD-10-CM | POA: Diagnosis not present

## 2022-11-27 DIAGNOSIS — M542 Cervicalgia: Secondary | ICD-10-CM | POA: Diagnosis not present

## 2022-11-27 DIAGNOSIS — M9902 Segmental and somatic dysfunction of thoracic region: Secondary | ICD-10-CM | POA: Diagnosis not present

## 2022-11-27 DIAGNOSIS — E1169 Type 2 diabetes mellitus with other specified complication: Secondary | ICD-10-CM | POA: Diagnosis not present

## 2022-11-27 MED ORDER — LEVOTHYROXINE SODIUM 125 MCG PO TABS: 125.0000 ug | ORAL_TABLET | Freq: Every day | ORAL | Status: DC

## 2022-11-27 MED ORDER — OZEMPIC (0.25 OR 0.5 MG/DOSE) 2 MG/3ML ~~LOC~~ SOPN: PEN_INJECTOR | SUBCUTANEOUS | 1 refills | Status: AC

## 2022-11-27 NOTE — Assessment & Plan Note (Signed)
A1c crossed into diabetes range. Discussed the benefits of qualifying for weight loss medications due to diabetes diagnosis. -Start Ozempic 0.25mg  weekly for the first two weeks, then increase to 0.5mg  weekly. -Check A1c in 6 weeks.

## 2022-11-27 NOTE — Assessment & Plan Note (Signed)
Improvement in symptoms with Gemtesa. -Continue Gemtesa as prescribed.

## 2022-11-27 NOTE — Assessment & Plan Note (Signed)
Struggling with weight gain and food noise. Discussed the benefits of Ozempic for weight loss and quieting food noise. -Start Ozempic as above for weight loss. -Encouraged portion control and increased protein and fiber intake.

## 2022-11-27 NOTE — Progress Notes (Signed)
Established Patient Office Visit  Subjective   Patient ID: Erica Pineda, female    DOB: 1950/06/24  Age: 72 y.o. MRN: [id number]  Chief Complaint  Patient presents with   Weight Management Screening    Diet - well balanced Exercise - 3 days a week walking for 30 minutes    HPI  Discussed the use of AI scribe software for clinical note transcription with the patient, who gave verbal consent to proceed.  History of Present Illness   The patient, with a history of obesity and newly diagnosed diabetes, presents with concerns about recent weight gain. They report gaining six pounds in the past month, which has caused significant distress. The patient acknowledges a long-standing struggle with food cravings and overeating, which are described as "food noise." They recall having these issues since their first attempt at weight loss with [employer] shortly after getting married. The patient also mentions a history of thyroid issues, for which they are on medication.  In addition to their weight concerns, the patient reports improvement in urinary incontinence symptoms with the use of Gemtesa. They describe a significant reduction in "dribble dribble" episodes, with only one occurrence in the past two weeks. This improvement has had a positive impact on their quality of life.         ROS    Objective:     BP 135/83 (BP Location: Left Arm, Patient Position: Sitting, Cuff Size: Large)   Pulse 76   Ht 5\' 7"  (1.702 m)   Wt 248 lb 3.2 oz (112.6 kg)   SpO2 94%   BMI 38.87 kg/m    Physical Exam Vitals reviewed.  Constitutional:      General: She is not in acute distress.    Appearance: Normal appearance. She is well-developed. She is not diaphoretic.  HENT:     Head: Normocephalic and atraumatic.  Eyes:     General: No scleral icterus.    Conjunctiva/sclera: Conjunctivae normal.  Neck:     Thyroid: No thyromegaly.  Cardiovascular:     Rate and Rhythm: Normal rate and regular  rhythm.     Heart sounds: Normal heart sounds. No murmur heard. Pulmonary:     Effort: Pulmonary effort is normal. No respiratory distress.     Breath sounds: Normal breath sounds. No wheezing, rhonchi or rales.  Musculoskeletal:     Cervical back: Neck supple.     Right lower leg: No edema.     Left lower leg: No edema.  Lymphadenopathy:     Cervical: No cervical adenopathy.  Skin:    General: Skin is warm and dry.     Findings: No rash.  Neurological:     Mental Status: She is alert and oriented to person, place, and time. Mental status is at baseline.  Psychiatric:        Mood and Affect: Mood normal.        Behavior: Behavior normal.      No results found for any visits on 11/27/22.    The 10-year ASCVD risk score (Arnett DK, et al., 2019) is: 22.4%    Assessment & Plan:   Problem List Items Addressed This Visit       Endocrine   T2DM (type 2 diabetes mellitus) (HCC)    A1c crossed into diabetes range. Discussed the benefits of qualifying for weight loss medications due to diabetes diagnosis. -Start Ozempic 0.25mg  weekly for the first two weeks, then increase to 0.5mg  weekly. -Check A1c in 6 weeks.  Relevant Medications   Semaglutide,0.25 or 0.5MG /DOS, (OZEMPIC, 0.25 OR 0.5 MG/DOSE,) 2 MG/3ML SOPN     Other   Morbid obesity (HCC) - Primary    Struggling with weight gain and food noise. Discussed the benefits of Ozempic for weight loss and quieting food noise. -Start Ozempic as above for weight loss. -Encouraged portion control and increased protein and fiber intake.      Relevant Medications   Semaglutide,0.25 or 0.5MG /DOS, (OZEMPIC, 0.25 OR 0.5 MG/DOSE,) 2 MG/3ML SOPN   Urinary incontinence, mixed    Improvement in symptoms with Leslye Peer. -Continue Gemtesa as prescribed.           General Health Maintenance -Get RSV shot at pharmacy this season. -Schedule physical in 6 weeks.        Return in about 6 weeks (around 01/08/2023) for CPE.     [name], MD

## 2022-12-06 ENCOUNTER — Other Ambulatory Visit: Payer: Self-pay

## 2022-12-06 MED ORDER — VALACYCLOVIR HCL 500 MG PO TABS: 500.0000 mg | ORAL_TABLET | Freq: Two times a day (BID) | ORAL | 2 refills | Status: AC

## 2022-12-06 NOTE — Progress Notes (Signed)
Valtrex for BBL appointment/sh

## 2022-12-07 ENCOUNTER — Other Ambulatory Visit: Payer: Self-pay | Admitting: Family Medicine

## 2022-12-07 DIAGNOSIS — E559 Vitamin D deficiency, unspecified: Secondary | ICD-10-CM

## 2022-12-08 ENCOUNTER — Ambulatory Visit: Payer: Medicare PPO | Attending: Physician Assistant

## 2022-12-08 DIAGNOSIS — M25562 Pain in left knee: Secondary | ICD-10-CM | POA: Diagnosis not present

## 2022-12-08 DIAGNOSIS — M25512 Pain in left shoulder: Secondary | ICD-10-CM | POA: Diagnosis not present

## 2022-12-08 DIAGNOSIS — G8929 Other chronic pain: Secondary | ICD-10-CM | POA: Insufficient documentation

## 2022-12-08 DIAGNOSIS — M6281 Muscle weakness (generalized): Secondary | ICD-10-CM | POA: Insufficient documentation

## 2022-12-08 DIAGNOSIS — M25612 Stiffness of left shoulder, not elsewhere classified: Secondary | ICD-10-CM | POA: Insufficient documentation

## 2022-12-08 NOTE — Telephone Encounter (Signed)
Requested medications are due for refill today.  yes  Requested medications are on the active medications list.  yes  Last refill. 11/03/2021 #13 3 rf  Future visit scheduled.   yes  Notes to clinic.  Provider to review.    Requested Prescriptions  Pending Prescriptions Disp Refills   Vitamin D, Ergocalciferol, (DRISDOL) 1.25 MG (50000 UNIT) CAPS capsule [Pharmacy Med Name: VIT D2 (ERGOCAL) 1.25MG (50,000U) CP] 13 capsule 3    Sig: TAKE 1 CAPSULE BY MOUTH EVERY 7 DAYS     Endocrinology:  Vitamins - Vitamin D Supplementation 2 Failed - 12/07/2022  2:34 PM      Failed - Manual Review: Route requests for 50,000 IU strength to the provider      Passed - Ca in normal range and within 360 days    Calcium  Date Value Ref Range Status  10/04/2022 9.4 8.7 - 10.3 mg/dL Final         Passed - Vitamin D in normal range and within 360 days    Vit D, 25-Hydroxy  Date Value Ref Range Status  12/16/2021 48.4 30.0 - 100.0 ng/mL Final    Comment:    Vitamin D deficiency has been defined by the Institute of Medicine and an Endocrine Society practice guideline as a level of serum 25-OH vitamin D less than 20 ng/mL (1,2). The Endocrine Society went on to further define vitamin D insufficiency as a level between 21 and 29 ng/mL (2). 1. IOM (Institute of Medicine). 2010. Dietary reference    intakes for calcium and D. [place]: The    [employer]. 2. Holick MF, Binkley [place], Bischoff-Ferrari HA, et al.    Evaluation, treatment, and prevention of vitamin D    deficiency: an Endocrine Society clinical practice    guideline. JCEM. 2011 Jul; 96(7):1911-30.          Passed - Valid encounter within last 12 months    Recent Outpatient Visits           1 week ago Morbid obesity [facility])   [place] [facility] [place], Marzella Schlein, MD   2 months ago Hypertension associated with diabetes [facility])   [place] [facility] [place], Marzella Schlein, MD   5  months ago Type 2 diabetes mellitus with other specified complication, without long-term current use of insulin [facility])   [facility] [place], Marzella Schlein, MD   7 months ago Primary hypertension   [facility] [place], Marzella Schlein, MD   8 months ago COVID-19   [facility], Marzella Schlein, MD       Future Appointments             In 1 month [place], Marzella Schlein, MD [facility]

## 2022-12-08 NOTE — Therapy (Addendum)
OUTPATIENT PHYSICAL THERAPY LOWER EXTREMITY TREATMENT/ DISCHARGE   Patient Name: Erica Pineda MRN: [id number] DOB:[birth year], 72 y.o., female Today's Date: 12/08/2022  END OF SESSION:  PT End of Session - 12/08/22 1030     Visit Number 3    Number of Visits 6    Date for PT Re-Evaluation 11/20/22    PT Start Time 1030    PT Stop Time 1100    PT Time Calculation (min) 30 min    Activity Tolerance Patient tolerated treatment well             Past Medical History:  Diagnosis Date   Allergy    Arthritis    thumbs   Cancer (HCC)    Thyroid-follwed by Endocrine   Cataract    surgery 2021 (?)   Diabetes mellitus without complication (HCC)    I'm not sure what mellitus is   Hyperlipidemia    Past Surgical History:  Procedure Laterality Date   ABDOMINAL HYSTERECTOMY     APPENDECTOMY  1996   BUNIONECTOMY  05/2007   and hammer-toe   CATARACT EXTRACTION Bilateral    COSMETIC SURGERY  2024   eyelids   EYE SURGERY  2021 (?)   cataracts   HERNIA REPAIR Left 1993   with mesh   HYMENECTOMY  1971   INCONTINENCE SURGERY  1989   TONSILLECTOMY AND ADENOIDECTOMY  1972   TOTAL THYROIDECTOMY  02/08/2009   TUBAL LIGATION  1989   after 3rd pregnancy   Patient Active Problem List   Diagnosis Date Noted   Urinary incontinence, mixed 10/27/2022   Nocturia 10/27/2022   Vaginal atrophy 10/27/2022   Insomnia 05/02/2022   Chronic left shoulder pain 05/02/2022   Hypertension associated with diabetes (HCC) 08/23/2021   Morbid obesity (HCC) 08/22/2021   T2DM (type 2 diabetes mellitus) (HCC) 08/22/2021   Stress at home 08/22/2021   Family history of breast cancer 08/10/2016   Adult hypothyroidism 06/11/2014   Avitaminosis D 06/11/2014   Hyperlipidemia associated with type 2 diabetes mellitus (HCC) 05/07/2006    PCP: Erasmo Downer, MD  REFERRING PROVIDER: Tera Partridge, PA  REFERRING DIAG: [phone] (ICD-10-CM) - Unilateral primary osteoarthritis, left knee  THERAPY DIAG:   Chronic pain of left knee  Muscle weakness (generalized)  Rationale for Evaluation and Treatment: Rehabilitation  ONSET DATE: For many years   SUBJECTIVE:   SUBJECTIVE STATEMENT:  Pt reports no pain in the L knee at today's session. States she has been traveling over the past month to multiple locations and has not had any pain in the L knee.   PERTINENT HISTORY: Pt reports chronic L knee> R knee pain. This pain began with insidious onset gradually over the last several years 2/2 to knee OA. Pain at anterior medial aspect of the L knee joint line and anterior lat aspect of R knee joint line but does not radiate down either LE. Pain described as stabbing and sharp. Denies radicular symptoms in LLE/ RLE along with joint crepitus, popping or clicking in the knee joint. When pt is having L knee pain, her aggravating factors include prolonged walking and ascending steps and the pain has been relieved with steroid injections, and OTC medications such as arthritis tylenol.   PAIN:  Are you having pain? No pain reported at today's session 0/10 Pain location: Medial aspect of anterior L knee Pain description: Sharp pain, No N/T noted Aggravating factors: Walking, ascending steps  Relieving factors: Injections, OTC Arthritis Tylenol  Worst pain: 7/10  when pt is experiencing L knee pain.   PRECAUTIONS: None  RED FLAGS: Bowel or bladder incontinence: No, Spinal tumors: No, and Cauda equina syndrome: No   WEIGHT BEARING RESTRICTIONS: No  FALLS:  Has patient fallen in last 6 months? Yes. Number of falls 1   Pt reports falling after tripping over obstacle at church.   OCCUPATION: Retired   PLOF: Independent  PATIENT GOALS: To be pain free  NEXT MD VISIT: Three months w/ PCP    OBJECTIVE:   DIAGNOSTIC FINDINGS: N/A  PATIENT SURVEYS:  FOTO 86/79  COGNITION: Overall cognitive status: Within functional limits for tasks assessed     SENSATION: WFL  MUSCLE LENGTH: Bilateral  Hamstring and quadriceps length WFL   POSTURE:  Trendelenburg gait noted w/ ambulation, along with mod eversion of RLE and LLE.   PALPATION: TTP at anterior medial aspect of L knee joint line, and anterior lateral aspect of R knee joint line.   LOWER EXTREMITY ROM:  Active ROM Right eval Left eval  Hip flexion    Hip extension    Hip abduction    Hip adduction    Hip internal rotation    Hip external rotation    Knee flexion 134 132  Knee extension    Ankle dorsiflexion    Ankle plantarflexion    Ankle inversion    Ankle eversion     (Blank rows = not tested)  LOWER EXTREMITY MMT:  MMT Right eval Left eval  Hip flexion 4+ 4  Hip extension 4- 4-  Hip abduction 4- 4-  Hip adduction 4- 4-  Hip internal rotation 4+ 4+  Hip external rotation 4+ 4+  Knee flexion 4+ 4  Knee extension 4+ 4+  Ankle dorsiflexion 4+ 4+  Ankle plantarflexion 4 4  Ankle inversion    Ankle eversion     (Blank rows = not tested)  LOWER EXTREMITY SPECIAL TESTS:  Hip special tests: Ely's test: negative Knee special tests: Thessaly test: negative  FUNCTIONAL TESTS:  30 Second STS: 14 TUG: 6.21 seconds Squat assessment: Significant bilat knee valgus noted, no pain : 1,543ft with mild pain near the end   GAIT: Distance walked: 136ft Assistive device utilized: None Level of assistance: Complete Independence Comments: Pt notes increased bilat LE eversion, trendelenburg, increased bilat knee valgus   TODAY'S TREATMENT: DATE: 12/08/22  Session spent reassessing pt's goals and POC to complete re-cert. (See below)   PATIENT EDUCATION:  Education details: HEP given to pt Person educated: Patient Education method: Medical illustrator Education comprehension: verbalized understanding and returned demonstration  HOME EXERCISE PROGRAM:  Access Code: [email or web address] URL: [email or web address] Date: 10/23/2022 Prepared by: Ronnie Derby  Exercises - Clam with  Resistance  - 1 x daily - 7 x weekly - 3 sets - 10 reps - Sidelying Reverse Clamshell with Resistance  - 1 x daily - 7 x weekly - 3 sets - 10 reps - Mini Squat with Counter Support  - 1 x daily - 7 x weekly - 3 sets - 10 reps - Standing Hip Extension with Unilateral Counter Support  - 1 x daily - 7 x weekly - 3 sets - 10 reps - Standard Lunge  - 1 x daily - 3-4 x weekly - 3 sets - 8 reps - Supine Hamstring Stretch with Strap  - 1 x daily - 7 x weekly - 1 sets - 3 reps - 30 hold - Supine Hip Adduction Isometric with Ball  - 1 x daily - 7  x weekly - 3 sets - 8 reps - 5 hold  ASSESSMENT:  CLINICAL IMPRESSION: Session focused on reviewing pt's STG and LTG's as pt requires re-certification for continued skilled PT interventions. Pt has achieved or partially achieved 3/4 of her STG and LTG's, including correcting her squat form, decreasing L knee pain during squats, improving bilat proximal hip strength, and increasing her distance with no increase in L knee pain. Pt self-reports having no mobility limitations or pain in the L knee, and both PT and pt agree that pt can continue to make functional gains independently with her HEP at home. Given these improvements and the pt's current level of function, it is determined that pt no longer requires skilled PT interventions for her L knee. Pt is being discharged at today's session, with recommendations for continued HEP progression and follow-up as needed.  OBJECTIVE IMPAIRMENTS: Abnormal gait, decreased mobility, decreased strength, hypomobility, and pain.   ACTIVITY LIMITATIONS: squatting, stairs, and walking  PARTICIPATION LIMITATIONS: community activity  PERSONAL FACTORS: Age, Time since onset of injury/illness/exacerbation, and 1 comorbidity: knee OA  are also affecting patient's functional outcome.   REHAB POTENTIAL: Good  CLINICAL DECISION MAKING: Stable/uncomplicated  EVALUATION COMPLEXITY: Low   GOALS: Goals reviewed with patient?  Yes  SHORT TERM GOALS: Target date: 10/30/22 Pt will be independent with HEP to improve bilat LE strength and decrease pain with functional activities  Baseline: 10/09/22: HEP given to pt 12/08/22: Inconsistent with HEP  Goal status: NOT MET  LONG TERM GOALS: Target date: 11/20/22  Pt will complete five squats in a row, with correct form noting no valgus of bilat knees and 0/10 NPS to improve functional movement in assisting w/ ADL's. Baseline: 10/09/22: Increased bilat knee valgus noted when completing squat 12/08/22: Adequate squat form 0/10NPS.  Goal status: MET  2.  Pt will improve bilat hip ext and abduction strength to 5/5, to note improvement in functional strength and decreased trendelenburg w/ ambulation.  Baseline: 10/09/22: Trendelenburg gait noted w/ ambulation; 12/08/22 bilat hip ext/ abd strength 4-.  Goal status: PARTIALLY MET   MMT Right Eval 9/9 Left Eval 9/9 Right 11/08 Left 11/08  Hip flexion 4+ 4 4+ 4+  Hip extension 4- 4- 4+ 4+  Hip abduction 4- 4- 5 4+    3.  Pt will increase 6 MWT by > 165' to display improvements in functional endurance with community ambulation.             Baseline: 10/09/22: deferred to next session, 10/23/22: 1565ft 12/08/22: 1658ft  0/10NPS  Goal status: PARTIALLY MET    PLAN:  PT FREQUENCY: 1x/week  PT DURATION: 6 weeks  PLANNED INTERVENTIONS: Therapeutic exercises, Therapeutic activity, Neuromuscular re-education, Balance training, Gait training, Patient/Family education, Self Care, Joint mobilization, Joint manipulation, Stair training, Cryotherapy, Moist heat, Manual therapy, and Re-evaluation   PLAN FOR NEXT SESSION: Pt discharged.    Marney Doctor, Student-PT  Delphia Grates. Fairly IV, PT, DPT Physical Therapist- [facility]  12/08/22, 12:18 PM

## 2022-12-12 ENCOUNTER — Ambulatory Visit: Payer: Medicare PPO

## 2022-12-14 ENCOUNTER — Encounter: Payer: Self-pay | Admitting: Family Medicine

## 2022-12-14 ENCOUNTER — Encounter: Payer: Medicare PPO | Admitting: Family Medicine

## 2022-12-14 DIAGNOSIS — M25512 Pain in left shoulder: Secondary | ICD-10-CM

## 2022-12-15 NOTE — Telephone Encounter (Signed)
Ok to send referral as requested. Send to same place that last referral was sent to (likely [facility])

## 2022-12-19 ENCOUNTER — Ambulatory Visit: Payer: Medicare PPO

## 2022-12-19 DIAGNOSIS — M25512 Pain in left shoulder: Secondary | ICD-10-CM | POA: Diagnosis not present

## 2022-12-19 DIAGNOSIS — G8929 Other chronic pain: Secondary | ICD-10-CM | POA: Diagnosis not present

## 2022-12-19 DIAGNOSIS — M6281 Muscle weakness (generalized): Secondary | ICD-10-CM | POA: Diagnosis not present

## 2022-12-19 DIAGNOSIS — M25612 Stiffness of left shoulder, not elsewhere classified: Secondary | ICD-10-CM | POA: Diagnosis not present

## 2022-12-19 DIAGNOSIS — M25562 Pain in left knee: Secondary | ICD-10-CM | POA: Diagnosis not present

## 2022-12-19 NOTE — Therapy (Signed)
OUTPATIENT PHYSICAL THERAPY UPPER EXTREMITY EVALUATION   Patient Name: Erica Pineda MRN: [id number] DOB:[birth year], 72 y.o., female Today's Date: 12/19/2022  END OF SESSION:  PT End of Session - 12/19/22 1030     Visit Number 1    Number of Visits 17    Date for PT Re-Evaluation 02/13/23    PT Start Time 1030    PT Stop Time 1112    PT Time Calculation (min) 42 min    Activity Tolerance Patient tolerated treatment well    Behavior During Therapy WFL for tasks assessed/performed             Past Medical History:  Diagnosis Date   Allergy    Arthritis    thumbs   Cancer (HCC)    Thyroid-follwed by Endocrine   Cataract    surgery 2021 (?)   Diabetes mellitus without complication (HCC)    I'm not sure what mellitus is   Hyperlipidemia    Past Surgical History:  Procedure Laterality Date   ABDOMINAL HYSTERECTOMY     APPENDECTOMY  1996   BUNIONECTOMY  05/2007   and hammer-toe   CATARACT EXTRACTION Bilateral    COSMETIC SURGERY  2024   eyelids   EYE SURGERY  2021 (?)   cataracts   HERNIA REPAIR Left 1993   with mesh   HYMENECTOMY  1971   INCONTINENCE SURGERY  1989   TONSILLECTOMY AND ADENOIDECTOMY  1972   TOTAL THYROIDECTOMY  02/08/2009   TUBAL LIGATION  1989   after 3rd pregnancy   Patient Active Problem List   Diagnosis Date Noted   Urinary incontinence, mixed 10/27/2022   Nocturia 10/27/2022   Vaginal atrophy 10/27/2022   Insomnia 05/02/2022   Chronic left shoulder pain 05/02/2022   Hypertension associated with diabetes (HCC) 08/23/2021   Morbid obesity (HCC) 08/22/2021   T2DM (type 2 diabetes mellitus) (HCC) 08/22/2021   Stress at home 08/22/2021   Family history of breast cancer 08/10/2016   Adult hypothyroidism 06/11/2014   Avitaminosis D 06/11/2014   Hyperlipidemia associated with type 2 diabetes mellitus (HCC) 05/07/2006    PCP: Erasmo Downer, MD  REFERRING PROVIDER: Erasmo Downer, MD  REFERRING DIAG: [phone] (ICD-10-CM) -  Unilateral primary osteoarthritis, left knee  THERAPY DIAG:  Chronic left shoulder pain  Decreased ROM of left shoulder  Rationale for Evaluation and Treatment: Rehabilitation  ONSET DATE: Chronic for many years (3-4 years ago)  SUBJECTIVE:  SUBJECTIVE STATEMENT: Pt is well known to PT clinic and returning for her ongoing L shoulder pain which was treated earlier this year.  Hand dominance: Right  PERTINENT HISTORY: Pt is well known to PT clinic and returning for her ongoing L shoulder pain. Had good success with prior PT bout. Has had weight gain and has not been relatively compliant with HEP. Pain is anterior. Denies N/T down LUE. No pain with neck related movements. Painful movements include shoulder extension and behind the back, laying on her L side at night. Has had good success with massage therapy to her pectoralis major. Has limited L shoulder AROM in flexion and is painful. Worst pain up to 8/10 NPS, no pain at rest. Some days has pain 2-3/10 NPS with activity.   PAIN:  Are you having pain? Yes: NPRS scale: 6/10 Pain location: Anterior shoulder Pain description: Sharp, stabbing Aggravating factors: Laying on L side, overhead reaching, behind back Relieving factors: Rest, prior exercises, massage therapy.   PRECAUTIONS: None  RED FLAGS: None   WEIGHT BEARING RESTRICTIONS: No  FALLS:  Has patient fallen in last 6 months? No  LIVING ENVIRONMENT: Lives with: lives with their spouse  OCCUPATION: Retired  PLOF: Independent  PATIENT GOALS: Don/doff her bra with less pain or no pain  NEXT MD VISIT: N/A  OBJECTIVE:  Note: Objective measures were completed at Evaluation unless otherwise noted.  DIAGNOSTIC FINDINGS:  N/A  PATIENT SURVEYS :  Quick Dash deferred and FOTO  deferred  COGNITION: Overall cognitive status: Within functional limits for tasks assessed     SENSATION: WFL  POSTURE: Forward head, rounded shoulders  CERVICAL AROM: Flexion: 65 deg Extension: 35 deg Rotation R/L: 49/50 deg Lateral flexion R/L:  30 deg/ 40 deg  UPPER EXTREMITY ROM:   Active ROM Right eval Left eval  Shoulder flexion 180 134*  Shoulder extension 70 45*  Shoulder abduction 160 125*  Shoulder adduction 80   Shoulder internal rotation 90 (90 PROM) 69* (75 PROM)  Shoulder external rotation 80 (90 PROM) 40 (60 PROM)  Elbow flexion    Elbow extension    Wrist flexion    Wrist extension    Wrist ulnar deviation    Wrist radial deviation    Wrist pronation    Wrist supination    (Blank rows = not tested)  UPPER EXTREMITY MMT:  MMT Right eval Left eval  Shoulder flexion 5 4*  Shoulder extension    Shoulder abduction 5 5*  Shoulder adduction    Shoulder internal rotation 5 5  Shoulder external rotation 5 4*  Middle trapezius    Lower trapezius    Elbow flexion    Elbow extension    Wrist flexion    Wrist extension    Wrist ulnar deviation    Wrist radial deviation    Wrist pronation    Wrist supination    Grip strength (lbs)    (Blank rows = not tested)  SHOULDER SPECIAL TESTS: Impingement tests: Neer impingement test: positive , Hawkins/Kennedy impingement test: positive , and Painful arc test: negative Rotator cuff assessment: Empty can test: positive  and Full can test: positive    JOINT MOBILITY TESTING:  Hypomobile at GHJ in all planes  PALPATION:  TTP at L pec major near insertion. Palpable active trigger points appreciated with palpation.    TODAY'S TREATMENT:  DATE: 12/19/22  Provided HEP with education on reps/sets/frequency.  PATIENT EDUCATION: Education details: Prognosis, POC,  HEP Person educated: Patient Education method: Explanation, Demonstration, Tactile cues, Verbal cues, and Handouts Education comprehension: verbalized understanding  HOME EXERCISE PROGRAM: Access Code: [email or web address] URL: [email or web address] Date: 12/19/2022 Prepared by: Ronnie Derby  Exercises - Seated Upper Trapezius Stretch  - 1 x daily - 7 x weekly - 1 sets - 3 reps - 30 hold - Seated Cervical Rotation AROM  - 1 x daily - 7 x weekly - 2 sets - 12 reps - Doorway Pec Stretch at 90 Degrees Abduction  - 1 x daily - 7 x weekly - 1 sets - 3 reps - 30 hold - Supine Shoulder Flexion AAROM with Hands Clasped  - 1 x daily - 7 x weekly - 2 sets - 12 reps  ASSESSMENT:  CLINICAL IMPRESSION: Patient is a 72 y.o. female who was seen today for physical therapy evaluation and treatment for L shoulder pain. Pt presents with mild cervical AROM impairments on L side. Pt also demonstrates reduction of L shoulder AROM in all planes with concordant pain along with painful and weak shoulder ER and positive cluster testing for L RTC tendinitis/secondary impingement due to abnormal posture, pec major tightness and GHJ hypomobility. These impairments are leading to difficulty participating in sleep, overhead ADL completion and dressing ADL's behind the back such as donning/doffing a bra. Pt will benefit from skilled PT services to address these impairments and maximize return to pain free PLOF and improve QoL.   OBJECTIVE IMPAIRMENTS: decreased mobility, decreased ROM, decreased strength, hypomobility, impaired flexibility, postural dysfunction, and pain.   ACTIVITY LIMITATIONS: sleeping, dressing, reach over head, and hygiene/grooming  PARTICIPATION LIMITATIONS: community activity and yard work  PERSONAL FACTORS: Age, Fitness, Past/current experiences, and Time since onset of injury/illness/exacerbation are also affecting patient's functional outcome.   REHAB POTENTIAL: Good  CLINICAL DECISION  MAKING: Stable/uncomplicated  EVALUATION COMPLEXITY: Low  GOALS: Goals reviewed with patient? No  SHORT TERM GOALS: Target date: 01/16/23  Pt will be independent with HEP to improve L shoulder pain, AROM, and strength for overhead and dressing ADL's. Baseline: 11/19: provided HEP Goal status: INITIAL  LONG TERM GOALS: Target date: 02/13/23  Pt will improve FOTO to target score to demonstrate clinically significant improvement in functional mobility.  Baseline: 12/19/22: deferred to next session Goal status: INITIAL  2.  Pt will improve L shoulder AROM in flexion, ER, IR, and extension by at least 7 degrees to improve overhead and behind the back ADL's.  Baseline:  Active ROM Right eval Left eval  Shoulder flexion 180 134*  Shoulder extension 70 45*  Shoulder abduction 160 125*  Shoulder adduction 80   Shoulder internal rotation 90 (90 PROM) 69* (75 PROM)  Shoulder external rotation 80 (90 PROM) 40 (60 PROM)    Goal status: INITIAL  3.  Pt will report worst pain as a 4/10 NPS with LUE use to demonstrate significant improvement in reduced pain levels.  Baseline: 11/19: 8/10 NPS Goal status: INITIAL  4.  Pt will be able to don/doff bra using L shoulder to improve dressing ADL's. Baseline: 12/19/22: unable Goal status: INITIAL  PLAN: PT FREQUENCY: 1-2x/week  PT DURATION: 8 weeks  PLANNED INTERVENTIONS: 97164- PT Re-evaluation, 97110-Therapeutic exercises, 97530- Therapeutic activity, 97112- Neuromuscular re-education, 97535- Self Care, 78295- Manual therapy, 97014- Electrical stimulation (unattended), [phone]- Electrical stimulation (manual), Patient/Family education, Dry Needling, Joint mobilization, Joint manipulation, Spinal manipulation, Spinal mobilization, Cryotherapy, and Moist heat  PLAN  FOR NEXT SESSION: FOTO/ Quick DASH, cervicothoracic mobility. Review HEP.   Delphia Grates. Fairly IV, PT, DPT Physical Therapist- [place]  [facility]   12/19/2022, 12:57 PM

## 2022-12-20 ENCOUNTER — Ambulatory Visit (INDEPENDENT_AMBULATORY_CARE_PROVIDER_SITE_OTHER): Payer: Self-pay | Admitting: Dermatology

## 2022-12-20 DIAGNOSIS — M9901 Segmental and somatic dysfunction of cervical region: Secondary | ICD-10-CM | POA: Diagnosis not present

## 2022-12-20 DIAGNOSIS — L814 Other melanin hyperpigmentation: Secondary | ICD-10-CM

## 2022-12-20 DIAGNOSIS — M542 Cervicalgia: Secondary | ICD-10-CM | POA: Diagnosis not present

## 2022-12-20 DIAGNOSIS — M9902 Segmental and somatic dysfunction of thoracic region: Secondary | ICD-10-CM | POA: Diagnosis not present

## 2022-12-20 DIAGNOSIS — M5414 Radiculopathy, thoracic region: Secondary | ICD-10-CM | POA: Diagnosis not present

## 2022-12-20 DIAGNOSIS — L719 Rosacea, unspecified: Secondary | ICD-10-CM

## 2022-12-20 NOTE — Progress Notes (Signed)
Follow-Up Visit   Subjective  Erica Pineda is a 72 y.o. female who presents for the following: Rosacea and lentigines of the face, patient presents for 1st BBL treatment today.     The following portions of the chart were reviewed this encounter and updated as appropriate: medications, allergies, medical history  Review of Systems:  No other skin or systemic complaints except as noted in HPI or Assessment and Plan.  Objective  Well appearing patient in no apparent distress; mood and affect are within normal limits.  A focused examination was performed of the following areas: Face  Relevant physical exam findings are noted in the Assessment and Plan.  face Scattered tan macules.                face Mid face erythema with telangiectasias.       Assessment & Plan   Lentigines face  1st BBL treatment today.   Laser kit and gel face mask given to patient today.   Photorejuvenation - face Prior to the procedure, the patient's past medical history, medications, allergies, and the rare but potential risks and complications were reviewed with the patient and a signed consent was obtained.  Pre and post treatment care was discussed and instructions provided.   [place] BBL - 12/20/22 1200      Patient Details   Skin Type: II    Anesthestic Cream Applied: No    Photo Takes: Yes    Consent Signed: Yes      Treatment Details   Date: 12/20/22    Treatment #: 1    Area: face    Filter: 1st Pass;2nd Pass;3rd Pass      1st Pass cheeks  Location: F    Device: 515 Filter    BBL j/cm2: 10    PW Msec Sec: 10    Cooling Temp: 15    Pulses: 88    15x45: This crystal used.      2nd Pass forehead/mid face  Location: F    Device: 515 Filter    BBL j/cm2: 12    PW Msec Sec: 10    Cooling Temp: 15    Pulses: 107    15x15: This crystal used.      Patient tolerated the procedure well.   Wynelle Link avoidance was stressed. The patient will call with any problems,  questions or concerns prior to their next appointment.    Rosacea face  BBL performed today.   Rosacea is a chronic progressive skin condition usually affecting the face of adults, causing redness and/or acne bumps. It is treatable but not curable. It sometimes affects the eyes (ocular rosacea) as well. It may respond to topical and/or systemic medication and can flare with stress, sun exposure, alcohol, exercise, topical steroids (including hydrocortisone/cortisone 10) and some foods.  Daily application of broad spectrum spf 30+ sunscreen to face is recommended to reduce flares.  Photorejuvenation - face Prior to the procedure, the patient's past medical history, medications, allergies, and the rare but potential risks and complications were reviewed with the patient and a signed consent was obtained.  Pre and post treatment care was discussed and instructions provided.   [place] BBL - 12/20/22 1200      Patient Details   Skin Type: II    Anesthestic Cream Applied: No    Photo Takes: Yes    Consent Signed: Yes      Treatment Details   Date: 12/20/22    Treatment #: 1  Area: face    Filter: 1st Pass;2nd Pass;3rd Pass       3rd Pass mid face  Location: F    Device: 560 Filter    BBL j/cm2: 15    PW Msec Sec: 15    Cooling Temp: 15    Pulses: 94    15x15: This crystal used.          Patient tolerated the procedure well.   Wynelle Link avoidance was stressed. The patient will call with any problems, questions or concerns prior to their next appointment.    Related Medications Azelaic Acid 15 % gel Apply to face 1-2 times a day for rosacea.     Return 4-6 wks, for BBL, 2nd tx.  ICherlyn Labella, CMA, am acting as scribe for Willeen Niece, MD .   Documentation: I have reviewed the above documentation for accuracy and completeness, and I agree with the above.  Willeen Niece, MD

## 2022-12-20 NOTE — Patient Instructions (Signed)

## 2022-12-21 ENCOUNTER — Encounter: Payer: Self-pay | Admitting: Dermatology

## 2022-12-25 ENCOUNTER — Ambulatory Visit: Payer: Medicare PPO

## 2023-01-01 DIAGNOSIS — M9901 Segmental and somatic dysfunction of cervical region: Secondary | ICD-10-CM | POA: Diagnosis not present

## 2023-01-01 DIAGNOSIS — M9902 Segmental and somatic dysfunction of thoracic region: Secondary | ICD-10-CM | POA: Diagnosis not present

## 2023-01-01 DIAGNOSIS — M542 Cervicalgia: Secondary | ICD-10-CM | POA: Diagnosis not present

## 2023-01-01 DIAGNOSIS — M5414 Radiculopathy, thoracic region: Secondary | ICD-10-CM | POA: Diagnosis not present

## 2023-01-02 ENCOUNTER — Ambulatory Visit: Payer: Medicare PPO

## 2023-01-03 DIAGNOSIS — M898X9 Other specified disorders of bone, unspecified site: Secondary | ICD-10-CM | POA: Diagnosis not present

## 2023-01-03 DIAGNOSIS — M2142 Flat foot [pes planus] (acquired), left foot: Secondary | ICD-10-CM | POA: Diagnosis not present

## 2023-01-03 DIAGNOSIS — S93325S Dislocation of tarsometatarsal joint of left foot, sequela: Secondary | ICD-10-CM | POA: Diagnosis not present

## 2023-01-03 DIAGNOSIS — E119 Type 2 diabetes mellitus without complications: Secondary | ICD-10-CM | POA: Diagnosis not present

## 2023-01-04 ENCOUNTER — Ambulatory Visit: Payer: Medicare PPO | Attending: Family Medicine

## 2023-01-04 DIAGNOSIS — M25512 Pain in left shoulder: Secondary | ICD-10-CM | POA: Diagnosis not present

## 2023-01-04 DIAGNOSIS — G8929 Other chronic pain: Secondary | ICD-10-CM | POA: Insufficient documentation

## 2023-01-04 DIAGNOSIS — M25612 Stiffness of left shoulder, not elsewhere classified: Secondary | ICD-10-CM | POA: Diagnosis not present

## 2023-01-04 NOTE — Therapy (Signed)
OUTPATIENT PHYSICAL THERAPY UPPER EXTREMITY TREATMENT   Patient Name: Erica Pineda MRN: [id number] DOB:04-11-1950, 72 y.o., female Today's Date: 01/04/2023  END OF SESSION:  PT End of Session - 01/04/23 0823     Visit Number 2    Number of Visits 17    Date for PT Re-Evaluation 02/13/23    PT Start Time 0816    PT Stop Time 0900    PT Time Calculation (min) 44 min    Activity Tolerance Patient tolerated treatment well    Behavior During Therapy [facility] for tasks assessed/performed             Past Medical History:  Diagnosis Date   Allergy    Arthritis    thumbs   Cancer (HCC)    Thyroid-follwed by Endocrine   Cataract    surgery 2021 (?)   Diabetes mellitus without complication (HCC)    I'm not sure what mellitus is   Hyperlipidemia    Past Surgical History:  Procedure Laterality Date   ABDOMINAL HYSTERECTOMY     APPENDECTOMY  1996   BUNIONECTOMY  05/2007   and hammer-toe   CATARACT EXTRACTION Bilateral    COSMETIC SURGERY  2024   eyelids   EYE SURGERY  2021 (?)   cataracts   HERNIA REPAIR Left 1993   with mesh   HYMENECTOMY  1971   INCONTINENCE SURGERY  1989   TONSILLECTOMY AND ADENOIDECTOMY  1972   TOTAL THYROIDECTOMY  02/08/2009   TUBAL LIGATION  1989   after 3rd pregnancy   Patient Active Problem List   Diagnosis Date Noted   Urinary incontinence, mixed 10/27/2022   Nocturia 10/27/2022   Vaginal atrophy 10/27/2022   Insomnia 05/02/2022   Chronic left shoulder pain 05/02/2022   Hypertension associated with diabetes (HCC) 08/23/2021   Morbid obesity (HCC) 08/22/2021   T2DM (type 2 diabetes mellitus) (HCC) 08/22/2021   Stress at home 08/22/2021   Family history of breast cancer 08/10/2016   Adult hypothyroidism 06/11/2014   Avitaminosis D 06/11/2014   Hyperlipidemia associated with type 2 diabetes mellitus (HCC) 05/07/2006    PCP: Erasmo Downer, MD  REFERRING PROVIDER: Erasmo Downer, MD  REFERRING DIAG: [phone] (ICD-10-CM) -  Unilateral primary osteoarthritis, left knee  THERAPY DIAG:  Chronic left shoulder pain  Decreased ROM of left shoulder  Rationale for Evaluation and Treatment: Rehabilitation  ONSET DATE: Chronic for many years (3-4 years ago)  SUBJECTIVE:  SUBJECTIVE STATEMENT: Pt reports relief with HEP. But still having upwards to 3-4/10 NPS with certain movements. Hand dominance: Right  PERTINENT HISTORY: Pt is well known to PT clinic and returning for her ongoing L shoulder pain. Had good success with prior PT bout. Has had weight gain and has not been relatively compliant with HEP. Pain is anterior. Denies N/T down LUE. No pain with neck related movements. Painful movements include shoulder extension and behind the back, laying on her L side at night. Has had good success with massage therapy to her pectoralis major. Has limited L shoulder AROM in flexion and is painful. Worst pain up to 8/10 NPS, no pain at rest. Some days has pain 2-3/10 NPS with activity.   PAIN:  Are you having pain? Yes: NPRS scale: 3-4/10 Pain location: Anterior shoulder Pain description: Sharp, stabbing Aggravating factors: Laying on L side, overhead reaching, behind back Relieving factors: Rest, prior exercises, massage therapy.   PRECAUTIONS: None  RED FLAGS: None   WEIGHT BEARING RESTRICTIONS: No  FALLS:  Has patient fallen in last 6 months? No  LIVING ENVIRONMENT: Lives with: lives with their spouse  OCCUPATION: Retired  PLOF: Independent  PATIENT GOALS: Don/doff her bra with less pain or no pain  NEXT MD VISIT: N/A  OBJECTIVE:  Note: Objective measures were completed at Evaluation unless otherwise noted.  DIAGNOSTIC FINDINGS:  N/A  PATIENT SURVEYS :  Quick Dash deferred and FOTO deferred  COGNITION: Overall  cognitive status: Within functional limits for tasks assessed     SENSATION: WFL  POSTURE: Forward head, rounded shoulders  CERVICAL AROM: Flexion: 65 deg Extension: 35 deg Rotation R/L: 49/50 deg Lateral flexion R/L:  30 deg/ 40 deg  UPPER EXTREMITY ROM:   Active ROM Right eval Left eval  Shoulder flexion 180 134*  Shoulder extension 70 45*  Shoulder abduction 160 125*  Shoulder adduction 80   Shoulder internal rotation 90 (90 PROM) 69* (75 PROM)  Shoulder external rotation 80 (90 PROM) 40 (60 PROM)  Elbow flexion    Elbow extension    Wrist flexion    Wrist extension    Wrist ulnar deviation    Wrist radial deviation    Wrist pronation    Wrist supination    (Blank rows = not tested)  UPPER EXTREMITY MMT:  MMT Right eval Left eval  Shoulder flexion 5 4*  Shoulder extension    Shoulder abduction 5 5*  Shoulder adduction    Shoulder internal rotation 5 5  Shoulder external rotation 5 4*  Middle trapezius    Lower trapezius    Elbow flexion    Elbow extension    Wrist flexion    Wrist extension    Wrist ulnar deviation    Wrist radial deviation    Wrist pronation    Wrist supination    Grip strength (lbs)    (Blank rows = not tested)  SHOULDER SPECIAL TESTS: Impingement tests: Neer impingement test: positive , Hawkins/Kennedy impingement test: positive , and Painful arc test: negative Rotator cuff assessment: Empty can test: positive  and Full can test: positive    JOINT MOBILITY TESTING:  Hypomobile at GHJ in all planes  PALPATION:  TTP at L pec major near insertion. Palpable active trigger points appreciated with palpation.    TODAY'S TREATMENT:  DATE: 01/04/23  There.ex: UBE level 3: forwards 3 minutes, backwards 1 minute Performing FOTO: 62/71  Performing quick DASH: 18.2%   Review HEP performing  reps/sets/frequency as listed below:  Access Code: [email or web address] URL: [email or web address] Date: 12/19/2022 Prepared by: Ronnie Derby   Exercises - Seated Upper Trapezius Stretch  - 1 x daily - 7 x weekly - 1 sets - 3 reps - 30 hold - Seated Cervical Rotation AROM  - 1 x daily - 7 x weekly - 2 sets - 12 reps - Doorway Pec Stretch at 90 Degrees Abduction  - 1 x daily - 7 x weekly - 1 sets - 3 reps - 30 hold - Supine Shoulder Flexion AAROM with Hands Clasped  - 1 x daily - 7 x weekly - 2 sets - 12 reps   Standing shoulder extension AAROM with dowel: 2x12    Seated L shoulder ER AAROM for LUE: 2x12   Hook lying L shoulder ER/IR with 90 deg abduction: x24   Updated and reviewed HEP with patient. New print out provided.   PATIENT EDUCATION: Education details: Prognosis, POC, HEP Person educated: Patient Education method: Explanation, Demonstration, Tactile cues, Verbal cues, and Handouts Education comprehension: verbalized understanding  HOME EXERCISE PROGRAM: Access Code: [email or web address] URL: [email or web address] Date: 01/04/2023 Prepared by: Ronnie Derby  Exercises - Seated Upper Trapezius Stretch  - 1 x daily - 7 x weekly - 1 sets - 3 reps - 30 hold - Seated Cervical Rotation AROM  - 1 x daily - 7 x weekly - 2 sets - 12 reps - Doorway Pec Stretch at 90 Degrees Abduction  - 1 x daily - 7 x weekly - 1 sets - 3 reps - 30 hold - Supine Shoulder Flexion AAROM with Hands Clasped  - 1 x daily - 7 x weekly - 2 sets - 12 reps - Supine Shoulder External and Internal Rotation in Abduction with Dumbbell  - 1 x daily - 7 x weekly - 2 sets - 12 reps - Standing Shoulder Extension with Dowel  - 1 x daily - 7 x weekly - 2 sets - 12 reps  Access Code: [email or web address] URL: [email or web address] Date: 12/19/2022 Prepared by: Ronnie Derby  Exercises - Seated Upper Trapezius Stretch  - 1 x daily - 7 x weekly - 1 sets - 3 reps - 30 hold - Seated Cervical Rotation  AROM  - 1 x daily - 7 x weekly - 2 sets - 12 reps - Doorway Pec Stretch at 90 Degrees Abduction  - 1 x daily - 7 x weekly - 1 sets - 3 reps - 30 hold - Supine Shoulder Flexion AAROM with Hands Clasped  - 1 x daily - 7 x weekly - 2 sets - 12 reps  ASSESSMENT:  CLINICAL IMPRESSION: Pt returning for first treatment after eval. Pt has improvements in pain with HEP compliance. Progressing L shoulder mobility in all planes with further surveys completed to better understand impairments with UE use with ADL completion via FOTO and QuickDASH. Pt scoring relatively low on quick DASH survey at 18% indicating 18% disability with reports of moderate difficulty completing behind the back ADL's, recreational activity, and moderate shoulder pain. Pt understanding of updated HEP. Pt will benefit from skilled PT services to address these impairments and maximize return to pain free PLOF and improve QoL.   OBJECTIVE IMPAIRMENTS: decreased mobility, decreased ROM, decreased strength, hypomobility, impaired flexibility, postural dysfunction, and pain.   ACTIVITY LIMITATIONS: sleeping, dressing, reach over head, and hygiene/grooming  PARTICIPATION LIMITATIONS: community activity and yard work  PERSONAL FACTORS: Age, Fitness, Past/current experiences, and Time since onset of injury/illness/exacerbation are also affecting patient's functional outcome.   REHAB POTENTIAL: Good  CLINICAL DECISION MAKING: Stable/uncomplicated  EVALUATION COMPLEXITY: Low  GOALS: Goals reviewed with patient? No  SHORT TERM GOALS: Target date: 01/16/23  Pt will be independent with HEP to improve L shoulder pain, AROM, and strength for overhead and dressing ADL's. Baseline: 11/19: provided HEP Goal status: INITIAL  LONG TERM GOALS: Target date: 02/13/23  Pt will improve FOTO to target score to demonstrate clinically significant improvement in functional mobility.  Baseline: 12/19/22: deferred to next session; 01/04/23:  62/71 Goal status: INITIAL  2.  Pt will improve L shoulder AROM in flexion, ER, IR, and extension by at least 7 degrees to improve overhead and behind the back ADL's.  Baseline:  Active ROM Right eval Left eval  Shoulder flexion 180 134*  Shoulder extension 70 45*  Shoulder abduction 160 125*  Shoulder adduction 80   Shoulder internal rotation 90 (90 PROM) 69* (75 PROM)  Shoulder external rotation 80 (90 PROM) 40 (60 PROM)    Goal status: INITIAL  3.  Pt will report worst pain as a 4/10 NPS with LUE use to demonstrate significant improvement in reduced pain levels.  Baseline: 11/19: 8/10 NPS Goal status: INITIAL  4.  Pt will be able to don/doff bra using L shoulder to improve dressing ADL's. Baseline: 12/19/22: unable Goal status: INITIAL  5. Pt will improve Quick DASH score by at least 10% to demonstrate clinically significant improvement in function with L shoulder use.   Baseline: 01/04/23: 18.2%  Goal status: INITIAL  PLAN: PT FREQUENCY: 1-2x/week  PT DURATION: 8 weeks  PLANNED INTERVENTIONS: 97164- PT Re-evaluation, 97110-Therapeutic exercises, 97530- Therapeutic activity, 97112- Neuromuscular re-education, 97535- Self Care, 16109- Manual therapy, 97014- Electrical stimulation (unattended), [phone]- Electrical stimulation (manual), Patient/Family education, Dry Needling, Joint mobilization, Joint manipulation, Spinal manipulation, Spinal mobilization, Cryotherapy, and Moist heat  PLAN FOR NEXT SESSION: cervicothoracic mobility, L shoulder AAROM and periscapular strengthening.    Delphia Grates. Fairly IV, PT, DPT Physical Therapist- [place]  [facility]  01/04/2023, 9:41 AM

## 2023-01-07 ENCOUNTER — Encounter: Payer: Self-pay | Admitting: Family Medicine

## 2023-01-15 ENCOUNTER — Encounter: Payer: Self-pay | Admitting: Family Medicine

## 2023-01-15 ENCOUNTER — Ambulatory Visit (INDEPENDENT_AMBULATORY_CARE_PROVIDER_SITE_OTHER): Payer: Medicare PPO | Admitting: Family Medicine

## 2023-01-15 VITALS — BP 125/83 | HR 82 | Ht 67.0 in | Wt 235.2 lb

## 2023-01-15 DIAGNOSIS — M9901 Segmental and somatic dysfunction of cervical region: Secondary | ICD-10-CM | POA: Diagnosis not present

## 2023-01-15 DIAGNOSIS — E1169 Type 2 diabetes mellitus with other specified complication: Secondary | ICD-10-CM

## 2023-01-15 DIAGNOSIS — E1159 Type 2 diabetes mellitus with other circulatory complications: Secondary | ICD-10-CM

## 2023-01-15 DIAGNOSIS — M542 Cervicalgia: Secondary | ICD-10-CM | POA: Diagnosis not present

## 2023-01-15 DIAGNOSIS — Z0001 Encounter for general adult medical examination with abnormal findings: Secondary | ICD-10-CM

## 2023-01-15 DIAGNOSIS — M5414 Radiculopathy, thoracic region: Secondary | ICD-10-CM | POA: Diagnosis not present

## 2023-01-15 DIAGNOSIS — M9902 Segmental and somatic dysfunction of thoracic region: Secondary | ICD-10-CM | POA: Diagnosis not present

## 2023-01-15 DIAGNOSIS — E039 Hypothyroidism, unspecified: Secondary | ICD-10-CM

## 2023-01-15 DIAGNOSIS — I152 Hypertension secondary to endocrine disorders: Secondary | ICD-10-CM

## 2023-01-15 DIAGNOSIS — Z7985 Long-term (current) use of injectable non-insulin antidiabetic drugs: Secondary | ICD-10-CM

## 2023-01-15 DIAGNOSIS — E785 Hyperlipidemia, unspecified: Secondary | ICD-10-CM | POA: Diagnosis not present

## 2023-01-15 DIAGNOSIS — M25512 Pain in left shoulder: Secondary | ICD-10-CM

## 2023-01-15 DIAGNOSIS — G8929 Other chronic pain: Secondary | ICD-10-CM

## 2023-01-15 DIAGNOSIS — Z0000 Encounter for general adult medical examination without abnormal findings: Secondary | ICD-10-CM

## 2023-01-15 MED ORDER — SEMAGLUTIDE (1 MG/DOSE) 4 MG/3ML ~~LOC~~ SOPN: 1.0000 mg | PEN_INJECTOR | SUBCUTANEOUS | 2 refills | Status: DC

## 2023-01-15 NOTE — Assessment & Plan Note (Signed)
Well controlled Continue current medications Recheck metabolic panel 

## 2023-01-15 NOTE — Progress Notes (Signed)
Complete physical exam   Patient: Erica Pineda   DOB: April 30, 1950   72 y.o. Female  MRN: [id number] Visit Date: 01/15/2023  Today's healthcare provider: [name], MD   Chief Complaint  Patient presents with   Annual Exam    AWV 09/26/22. Patient reports trying to consume a healthy diet. She reports exercising 3-4 times a week walking for 30 minutes. She reports feeling well and sleeping fairly well with no concerns to report.    Subjective    Erica Pineda is a 72 y.o. female who presents today for a complete physical exam.    Discussed the use of AI scribe software for clinical note transcription with the patient, who gave verbal consent to proceed.  History of Present Illness   The patient, with a history of diabetes, high cholesterol, and urinary issues, presents for a physical. The chief complaint is persistent left shoulder pain that has not improved with physical therapy. The patient reports that the pain is worse with internal rotation and is sometimes severe enough to wake her up at night. The pain also interferes with daily activities, such as fastening a bra. The patient has been managing the pain with physical therapy and massage therapy, but the pain has not improved and has been present for years.  In addition to the shoulder pain, the patient is also managing her weight with Ozempic. She reports a weight loss of 13 pounds in less than two months and is tolerating the medication well, with the exception of occasional constipation. The patient also mentions that she is noticing increased hunger two to three days before her next dose.  The patient also discusses her urinary issues, which have improved significantly with Gemtesa. She did not provide specific details about these issues during this visit.        Last depression screening scores    01/15/2023    2:45 PM 11/27/2022   11:22 AM 10/06/2022   10:47 AM  PHQ 2/9 Scores  PHQ - 2 Score 0 1 0  PHQ- 9 Score 0 4 0    Last fall risk screening    01/15/2023    2:45 PM  Fall Risk   Falls in the past year? 0  Number falls in past yr: 0  Injury with Fall? 0  Risk for fall due to : No Fall Risks  Follow up Falls evaluation completed        Medications: Outpatient Medications Prior to Visit  Medication Sig   aspirin 81 MG EC tablet Take 81 mg by mouth daily. Swallow whole.   Azelaic Acid 15 % gel Apply to face 1-2 times a day for rosacea.   Biotin 5 MG TABS Take by mouth daily.   Cetirizine HCl 10 MG CAPS Take 1 capsule by mouth as needed. Reported on 06/30/2015   Cholecalciferol (VITAMIN D3) 1000 units CAPS Take by mouth daily.   Coenzyme Q10 (COQ10 PO) Take by mouth daily.   econazole nitrate 1 % cream Apply topically 2 (two) times daily as needed. For 2-4 weeks or until rash cleared then PRN recurrence   estradiol (ESTRACE) 0.1 MG/GM vaginal cream Place 0.5 g vaginally 2 (two) times a week. Place 0.5g nightly for two weeks then twice a week after   ezetimibe (ZETIA) 10 MG tablet Take 1 tablet (10 mg total) by mouth daily.   hydrocortisone (ANUSOL-HC) 25 MG suppository Place 1 suppository (25 mg total) rectally 2 (two) times daily as needed for hemorrhoids  or anal itching.   levothyroxine (SYNTHROID) 125 MCG tablet Take 1 tablet (125 mcg total) by mouth daily. Except 1.5 pills on Sundays   rosuvastatin (CRESTOR) 5 MG tablet Take 1 tablet (5 mg total) by mouth 2 (two) times a week.   traZODone (DESYREL) 100 MG tablet Take 1 tablet (100 mg total) by mouth at bedtime as needed for sleep.   valACYclovir (VALTREX) 500 MG tablet Take 1 tablet (500 mg total) by mouth 2 (two) times daily. 1 po bid for 7 days starting 1 day prior to procedure   Vibegron (GEMTESA) 75 MG TABS Take 1 tablet (75 mg total) by mouth daily.   Vitamin D, Ergocalciferol, (DRISDOL) 1.25 MG (50000 UNIT) CAPS capsule TAKE 1 CAPSULE BY MOUTH EVERY 7 DAYS   [DISCONTINUED] OZEMPIC, 0.25 OR 0.5 MG/DOSE, 2 MG/3ML SOPN Inject 0.5 mg into  the skin once a week.   No facility-administered medications prior to visit.    Review of Systems    Objective    BP 125/83 (BP Location: Left Arm, Patient Position: Sitting, Cuff Size: Large)   Pulse 82   Ht 5\' 7"  (1.702 m)   Wt 235 lb 3.2 oz (106.7 kg)   SpO2 96%   BMI 36.84 kg/m    Physical Exam Vitals reviewed.  Constitutional:      General: She is not in acute distress.    Appearance: Normal appearance. She is well-developed. She is not diaphoretic.  HENT:     Head: Normocephalic and atraumatic.     Right Ear: Tympanic membrane, ear canal and external ear normal.     Left Ear: Tympanic membrane, ear canal and external ear normal.     Nose: Nose normal.     Mouth/Throat:     Mouth: Mucous membranes are moist.     Pharynx: Oropharynx is clear. No oropharyngeal exudate.  Eyes:     General: No scleral icterus.    Conjunctiva/sclera: Conjunctivae normal.     Pupils: Pupils are equal, round, and reactive to light.  Neck:     Thyroid: No thyromegaly.  Cardiovascular:     Rate and Rhythm: Normal rate and regular rhythm.     Heart sounds: Normal heart sounds. No murmur heard. Pulmonary:     Effort: Pulmonary effort is normal. No respiratory distress.     Breath sounds: Normal breath sounds. No wheezing or rales.  Abdominal:     General: There is no distension.     Palpations: Abdomen is soft.     Tenderness: There is no abdominal tenderness.  Musculoskeletal:        General: No deformity.     Cervical back: Neck supple.     Right lower leg: No edema.     Left lower leg: No edema.  Lymphadenopathy:     Cervical: No cervical adenopathy.  Skin:    General: Skin is warm and dry.     Findings: No rash.  Neurological:     Mental Status: She is alert and oriented to person, place, and time. Mental status is at baseline.     Gait: Gait normal.  Psychiatric:        Mood and Affect: Mood normal.        Behavior: Behavior normal.        Thought Content: Thought  content normal.      Physical Exam   MEASUREMENTS: Weight reduced by thirteen pounds in less than two months. MUSCULOSKELETAL: Pain elicited on internal rotation of the left  shoulder. Weakness observed during 'empty can' test on the left shoulder. Tenderness localized to the anterior aspect of the left shoulder.       Results for orders placed or performed in visit on 01/15/23  HM DIABETES EYE EXAM  Result Value Ref Range   HM Diabetic Eye Exam No Retinopathy No Retinopathy    Assessment & Plan    Routine Health Maintenance and Physical Exam  Exercise Activities and Dietary recommendations  Goals      DIET - EAT MORE FRUITS AND VEGETABLES     Reduce portion size     Recommend to decrease portion sizes by eating 3 small healthy meals and at least 2 healthy snacks per day.        Immunization History  Administered Date(s) Administered   Fluad Quad(high Dose 65+) 11/06/2018, 10/12/2021   Fluad Trivalent(High Dose 65+) 10/06/2022   Influenza, High Dose Seasonal PF 01/26/2018, 10/13/2019   Influenza,inj,Quad PF,6+ Mos 12/16/2012   Moderna Sars-Covid-2 Vaccination 03/01/2019, 03/29/2019   PFIZER(Purple Top)SARS-COV-2 Vaccination 12/29/2019, 08/22/2020   Pneumococcal Conjugate-13 08/10/2016   Pneumococcal Polysaccharide-23 08/21/2017   Tdap 03/31/2010, 09/28/2020   Zoster Recombinant(Shingrix) 08/10/2016, 10/10/2016    Health Maintenance  Topic Date Due   COVID-19 Vaccine (5 - 2024-25 season) 10/01/2022   HEMOGLOBIN A1C  04/03/2023   Diabetic kidney evaluation - Urine ACR  06/13/2023   FOOT EXAM  06/13/2023   OPHTHALMOLOGY EXAM  06/16/2023   Medicare Annual Wellness (AWV)  09/26/2023   Diabetic kidney evaluation - eGFR measurement  10/04/2023   MAMMOGRAM  11/23/2023   DEXA SCAN  12/06/2025   Colonoscopy  01/30/2027   DTaP/Tdap/Td (3 - Td or Tdap) 09/29/2030   Pneumonia Vaccine 56+ Years old  Completed   INFLUENZA VACCINE  Completed   Hepatitis C Screening   Completed   Zoster Vaccines- Shingrix  Completed   HPV VACCINES  Aged Out    Discussed health benefits of physical activity, and encouraged her to engage in regular exercise appropriate for her age and condition.  Problem List Items Addressed This Visit       Cardiovascular and Mediastinum   Hypertension associated with diabetes (HCC)   Well controlled Continue current medications Recheck metabolic panel      Relevant Medications   Semaglutide, 1 MG/DOSE, 4 MG/3ML SOPN     Endocrine   Hyperlipidemia associated with type 2 diabetes mellitus (HCC)   No worsening of myalgias with zetia - continue zetia and Crestor 2 x weekly Lipid panel drawn this AM      Relevant Medications   Semaglutide, 1 MG/DOSE, 4 MG/3ML SOPN   Adult hypothyroidism   Well-controlled on last TSH-rechecked today Continue Synthroid at current dose      T2DM (type 2 diabetes mellitus) (HCC)   Currently on Ozempic with significant weight loss (13 pounds in less than two months). Reports constipation, likely due to decreased fiber intake. Discussed benefits and risks of increasing Ozempic dose to 1 mg for better appetite suppression and weight loss. Higher doses may increase constipation risk. Patient prefers to increase dose to 1 mg weekly. - Increase Ozempic to 1 mg weekly - Prescribe 1 mg Ozempic pen - Advise using two 0.5 mg doses until new prescription is filled - Recommend increasing fiber intake through diet or supplements to manage constipation      Relevant Medications   Semaglutide, 1 MG/DOSE, 4 MG/3ML SOPN     Other   Morbid obesity (HCC)   Continue ozempic as above  Relevant Medications   Semaglutide, 1 MG/DOSE, 4 MG/3ML SOPN   Chronic left shoulder pain   Relevant Orders   Ambulatory referral to Orthopedic Surgery   Other Visit Diagnoses       Annual physical exam    -  Primary           Left Shoulder Pain Chronic left shoulder pain, exacerbated by activities such as  fastening a bra and sleeping on the left side. Pain is achy rather than sharp. Physical exam suggests possible rotator cuff pathology. Patient has been undergoing physical therapy without significant improvement and has not had prior imaging or orthopedic evaluation. Patient prefers a conservative approach and is open to seeing Dr. Martha Clan at [facility] for further evaluation. - Refer to Dr. Martha Clan at [facility] for further evaluation - Consider MRI to assess for rotator cuff pathology per Ortho recommendations  General Health Maintenance Up to date on colonoscopy, mammogram, tetanus, pneumonia, and shingles vaccinations. Discussed new RSV vaccine and importance of COVID-19 boosters. Patient is considering COVID-19 booster, influenced by family members. - Recommend RSV vaccine - Recommend COVID-19 booster  Follow-up - Review lab results when available - Schedule follow-up appointment in six months.          Return in about 6 months (around 07/16/2023) for chronic disease f/u.     [name], MD  [facility] [phone] (phone) [phone] (fax)  [facility]

## 2023-01-15 NOTE — Telephone Encounter (Signed)
 Care team updated and letter sent for eye exam notes.

## 2023-01-15 NOTE — Assessment & Plan Note (Signed)
No worsening of myalgias with zetia - continue zetia and Crestor 2 x weekly Lipid panel drawn this AM

## 2023-01-15 NOTE — Assessment & Plan Note (Signed)
Well-controlled on last TSH-rechecked today Continue Synthroid at current dose

## 2023-01-15 NOTE — Assessment & Plan Note (Signed)
Continue ozempic as above

## 2023-01-15 NOTE — Assessment & Plan Note (Signed)
Currently on Ozempic with significant weight loss (13 pounds in less than two months). Reports constipation, likely due to decreased fiber intake. Discussed benefits and risks of increasing Ozempic dose to 1 mg for better appetite suppression and weight loss. Higher doses may increase constipation risk. Patient prefers to increase dose to 1 mg weekly. - Increase Ozempic to 1 mg weekly - Prescribe 1 mg Ozempic pen - Advise using two 0.5 mg doses until new prescription is filled - Recommend increasing fiber intake through diet or supplements to manage constipation

## 2023-01-16 LAB — COMPREHENSIVE METABOLIC PANEL
ALT: 21 [IU]/L (ref 0–32)
AST: 23 [IU]/L (ref 0–40)
Albumin: 4.2 g/dL (ref 3.8–4.8)
Alkaline Phosphatase: 68 [IU]/L (ref 44–121)
BUN/Creatinine Ratio: 13 (ref 12–28)
BUN: 10 mg/dL (ref 8–27)
Bilirubin Total: 0.4 mg/dL (ref 0.0–1.2)
CO2: 23 mmol/L (ref 20–29)
Calcium: 9.2 mg/dL (ref 8.7–10.3)
Chloride: 105 mmol/L (ref 96–106)
Creatinine, Ser: 0.78 mg/dL (ref 0.57–1.00)
Globulin, Total: 2 g/dL (ref 1.5–4.5)
Glucose: 99 mg/dL (ref 70–99)
Potassium: 4.9 mmol/L (ref 3.5–5.2)
Sodium: 141 mmol/L (ref 134–144)
Total Protein: 6.2 g/dL (ref 6.0–8.5)
eGFR: 81 mL/min/{1.73_m2} (ref >59)

## 2023-01-16 LAB — LIPID PANEL
Chol/HDL Ratio: 3.3 {ratio} (ref 0.0–4.4)
Cholesterol, Total: 163 mg/dL (ref 100–199)
HDL: 49 mg/dL (ref >39)
LDL Chol Calc (NIH): 93 mg/dL (ref 0–99)
Triglycerides: 114 mg/dL (ref 0–149)
VLDL Cholesterol Cal: 21 mg/dL (ref 5–40)

## 2023-01-16 LAB — TSH: TSH: 0.112 u[IU]/mL — ABNORMAL LOW (ref 0.450–4.500)

## 2023-01-16 LAB — HEMOGLOBIN A1C
Est. average glucose Bld gHb Est-mCnc: 128 mg/dL
Hgb A1c MFr Bld: 6.1 % — ABNORMAL HIGH (ref 4.8–5.6)

## 2023-01-18 ENCOUNTER — Telehealth: Payer: Self-pay

## 2023-01-18 NOTE — Telephone Encounter (Signed)
Reminder call for patient to start Valtrex 500 MG 1 po Bid x 7 days starting 1 day prior to procedure (BBL 02/05/23). Patient has refills at pharmacy.

## 2023-01-22 DIAGNOSIS — M542 Cervicalgia: Secondary | ICD-10-CM | POA: Diagnosis not present

## 2023-01-22 DIAGNOSIS — M9901 Segmental and somatic dysfunction of cervical region: Secondary | ICD-10-CM | POA: Diagnosis not present

## 2023-01-22 DIAGNOSIS — M5414 Radiculopathy, thoracic region: Secondary | ICD-10-CM | POA: Diagnosis not present

## 2023-01-22 DIAGNOSIS — M9902 Segmental and somatic dysfunction of thoracic region: Secondary | ICD-10-CM | POA: Diagnosis not present

## 2023-01-23 ENCOUNTER — Ambulatory Visit: Payer: Medicare PPO

## 2023-01-26 DIAGNOSIS — M7502 Adhesive capsulitis of left shoulder: Secondary | ICD-10-CM | POA: Diagnosis not present

## 2023-01-29 ENCOUNTER — Ambulatory Visit: Payer: Medicare PPO

## 2023-02-01 DIAGNOSIS — M9902 Segmental and somatic dysfunction of thoracic region: Secondary | ICD-10-CM | POA: Diagnosis not present

## 2023-02-01 DIAGNOSIS — M9901 Segmental and somatic dysfunction of cervical region: Secondary | ICD-10-CM | POA: Diagnosis not present

## 2023-02-01 DIAGNOSIS — M5414 Radiculopathy, thoracic region: Secondary | ICD-10-CM | POA: Diagnosis not present

## 2023-02-01 DIAGNOSIS — M542 Cervicalgia: Secondary | ICD-10-CM | POA: Diagnosis not present

## 2023-02-05 ENCOUNTER — Ambulatory Visit (INDEPENDENT_AMBULATORY_CARE_PROVIDER_SITE_OTHER): Payer: Self-pay | Admitting: Dermatology

## 2023-02-05 DIAGNOSIS — M9901 Segmental and somatic dysfunction of cervical region: Secondary | ICD-10-CM | POA: Diagnosis not present

## 2023-02-05 DIAGNOSIS — L814 Other melanin hyperpigmentation: Secondary | ICD-10-CM

## 2023-02-05 DIAGNOSIS — M542 Cervicalgia: Secondary | ICD-10-CM | POA: Diagnosis not present

## 2023-02-05 DIAGNOSIS — I781 Nevus, non-neoplastic: Secondary | ICD-10-CM

## 2023-02-05 DIAGNOSIS — M9902 Segmental and somatic dysfunction of thoracic region: Secondary | ICD-10-CM | POA: Diagnosis not present

## 2023-02-05 DIAGNOSIS — M5414 Radiculopathy, thoracic region: Secondary | ICD-10-CM | POA: Diagnosis not present

## 2023-02-05 DIAGNOSIS — L719 Rosacea, unspecified: Secondary | ICD-10-CM

## 2023-02-05 NOTE — Progress Notes (Signed)
 Follow-Up Visit   Subjective  Erica Pineda is a 73 y.o. female who presents for the following: Lentigos face, pt presents for BBL, Rosacea face, pt presents for BBL, 2nd treatment.  Good results with first treatment. The patient has spots, moles and lesions to be evaluated, some may be new or changing and the patient may have concern these could be cancer.   The following portions of the chart were reviewed this encounter and updated as appropriate: medications, allergies, medical history  Review of Systems:  No other skin or systemic complaints except as noted in HPI or Assessment and Plan.  Objective  Well appearing patient in no apparent distress; mood and affect are within normal limits.   A focused examination was performed of the following areas: face  Relevant exam findings are noted in the Assessment and Plan.  1st pass   2nd Pass   3rd pass   4th pass    Assessment & Plan   LENTIGINES face Exam: scattered tan macules Due to sun exposure Treatment Plan: BBL today Patient given another gel cooling mask   Sciton BBL - 02/05/23 1500      Patient Details   Skin Type: II    Anesthestic Cream Applied: No    Photo Takes: No    Consent Signed: Yes      Treatment Details   Date: 02/05/23    Treatment #: 2    Area: face    Filter: 1st Pass;2nd Pass;3rd Pass      1st Pass   Location: F   pigmented lesions   Device: 515 filter    BBL j/cm2: 12    PW Msec Sec: 10    Cooling Temp: 25    Pulses: 112    15x45: this crystal used      2nd Pass   Location: F   pigmented lesions nose, upper lip, chin, temples, forehead   Device: 515 filter    BBL j/cm2: 12    PW Msec Sec: 10    Cooling Temp: 25    Pulses: 98    15x15: this crystal used     Patient tolerated the procedure well.   Austin avoidance was stressed. The patient will call with any problems, questions or concerns prior to their next appointment.  Laser safety: Patient was advised in laser  safety.  Patient was fitted with laser safety goggles and advised to keep eyes closed during procedure with goggles on. Staff and provider ensured that patient and their own safety goggles were also on and eyes protected during procedure. Laser room door was secured and locked from the inside. Laser room door has laser safety sign affixed to the outside of the door.      ROSACEA face Exam Mid face erythema with telangiectasias   Chronic and persistent condition with duration or expected duration over one year. Condition is symptomatic / bothersome to patient. Not to goal.   Rosacea is a chronic progressive skin condition usually affecting the face of adults, causing redness and/or acne bumps. It is treatable but not curable. It sometimes affects the eyes (ocular rosacea) as well. It may respond to topical and/or systemic medication and can flare with stress, sun exposure, alcohol, exercise, topical steroids (including hydrocortisone /cortisone 10) and some foods.  Daily application of broad spectrum spf 30+ sunscreen to face is recommended to reduce flares.  Patient denies grittiness of the eyes  Treatment Plan BBL today   [place] BBL - 02/05/23 1500  Patient Details   Skin Type: II    Anesthestic Cream Applied: No    Photo Takes: No    Consent Signed: Yes      Treatment Details   Date: 02/05/23    Treatment #: 2    Area: face    Filter: 1st Pass;2nd Pass;3rd Pass;4th Pass      3rd Pass   Location: F   vasculer   Device: 560 filter    BBL j/cm2: 17    PW Msec Sec: 15    Cooling Temp: 20    Pulses: 92    15x45: this crystal used      4th Pass   Location: F   L nasal ala, L malar cheek   Device: 560 filter    BBL j/cm2: 20    PW Msec Sec: 10    Cooling Temp: 20    11mm: this      Patient tolerated the procedure well.   Austin avoidance was stressed. The patient will call with any problems, questions or concerns prior to their next appointment.  Laser  safety: Patient was advised in laser safety.  Patient was fitted with laser safety goggles and advised to keep eyes closed during procedure with goggles on. Staff and provider ensured that patient and their own safety goggles were also on and eyes protected during procedure. Laser room door was secured and locked from the inside. Laser room door has laser safety sign affixed to the outside of the door.          Return in about 2 months (around 04/05/2023) for BBL.  I, Grayce Saunas, RMA, am acting as scribe for Rexene Rattler, MD .   Documentation: I have reviewed the above documentation for accuracy and completeness, and I agree with the above.  Rexene Rattler, MD

## 2023-02-05 NOTE — Patient Instructions (Signed)

## 2023-02-06 DIAGNOSIS — M180 Bilateral primary osteoarthritis of first carpometacarpal joints: Secondary | ICD-10-CM | POA: Diagnosis not present

## 2023-02-07 ENCOUNTER — Other Ambulatory Visit: Payer: Self-pay | Admitting: Obstetrics

## 2023-02-08 ENCOUNTER — Ambulatory Visit: Payer: Medicare PPO | Attending: Family Medicine

## 2023-02-08 DIAGNOSIS — G8929 Other chronic pain: Secondary | ICD-10-CM | POA: Insufficient documentation

## 2023-02-08 DIAGNOSIS — M25612 Stiffness of left shoulder, not elsewhere classified: Secondary | ICD-10-CM | POA: Insufficient documentation

## 2023-02-08 DIAGNOSIS — M25512 Pain in left shoulder: Secondary | ICD-10-CM | POA: Insufficient documentation

## 2023-02-09 DIAGNOSIS — M7542 Impingement syndrome of left shoulder: Secondary | ICD-10-CM | POA: Diagnosis not present

## 2023-02-19 DIAGNOSIS — M5414 Radiculopathy, thoracic region: Secondary | ICD-10-CM | POA: Diagnosis not present

## 2023-02-19 DIAGNOSIS — M542 Cervicalgia: Secondary | ICD-10-CM | POA: Diagnosis not present

## 2023-02-19 DIAGNOSIS — M9902 Segmental and somatic dysfunction of thoracic region: Secondary | ICD-10-CM | POA: Diagnosis not present

## 2023-02-19 DIAGNOSIS — M9901 Segmental and somatic dysfunction of cervical region: Secondary | ICD-10-CM | POA: Diagnosis not present

## 2023-02-21 ENCOUNTER — Ambulatory Visit: Payer: Medicare PPO

## 2023-02-21 ENCOUNTER — Encounter: Payer: Self-pay | Admitting: Family Medicine

## 2023-02-21 DIAGNOSIS — M25512 Pain in left shoulder: Secondary | ICD-10-CM | POA: Diagnosis not present

## 2023-02-21 DIAGNOSIS — M25612 Stiffness of left shoulder, not elsewhere classified: Secondary | ICD-10-CM | POA: Diagnosis not present

## 2023-02-21 DIAGNOSIS — G8929 Other chronic pain: Secondary | ICD-10-CM | POA: Diagnosis not present

## 2023-02-21 DIAGNOSIS — M5414 Radiculopathy, thoracic region: Secondary | ICD-10-CM | POA: Diagnosis not present

## 2023-02-21 DIAGNOSIS — M9902 Segmental and somatic dysfunction of thoracic region: Secondary | ICD-10-CM | POA: Diagnosis not present

## 2023-02-21 DIAGNOSIS — M542 Cervicalgia: Secondary | ICD-10-CM | POA: Diagnosis not present

## 2023-02-21 DIAGNOSIS — M9901 Segmental and somatic dysfunction of cervical region: Secondary | ICD-10-CM | POA: Diagnosis not present

## 2023-02-21 NOTE — Therapy (Signed)
OUTPATIENT PHYSICAL THERAPY UPPER EXTREMITY TREATMENT/RECERT   Patient Name: Erica Pineda MRN: [id number] DOB:[birth year], 73 y.o., female Today's Date: 02/21/2023  END OF SESSION:  PT End of Session - 02/21/23 0813     Visit Number 3    Number of Visits 17    Date for PT Re-Evaluation 05/16/23    PT Start Time 0815    PT Stop Time 0900    PT Time Calculation (min) 45 min    Activity Tolerance Patient tolerated treatment well    Behavior During Therapy [facility] for tasks assessed/performed             Past Medical History:  Diagnosis Date   Allergy    Arthritis    thumbs   Cancer (HCC)    Thyroid-follwed by Endocrine   Cataract    surgery 2021 (?)   Diabetes mellitus without complication (HCC)    I'm not sure what mellitus is   Hyperlipidemia    Past Surgical History:  Procedure Laterality Date   ABDOMINAL HYSTERECTOMY     APPENDECTOMY  1996   BUNIONECTOMY  05/2007   and hammer-toe   CATARACT EXTRACTION Bilateral    COSMETIC SURGERY  2024   eyelids   EYE SURGERY  2021 (?)   cataracts   HERNIA REPAIR Left 1993   with mesh   HYMENECTOMY  1971   INCONTINENCE SURGERY  1989   TONSILLECTOMY AND ADENOIDECTOMY  1972   TOTAL THYROIDECTOMY  02/08/2009   TUBAL LIGATION  1989   after 3rd pregnancy   Patient Active Problem List   Diagnosis Date Noted   Urinary incontinence, mixed 10/27/2022   Nocturia 10/27/2022   Vaginal atrophy 10/27/2022   Insomnia 05/02/2022   Chronic left shoulder pain 05/02/2022   S/P eye surgery 04/06/2022   Dermatochalasis of both upper eyelids 10/24/2021   Hypertension associated with diabetes (HCC) 08/23/2021   Morbid obesity (HCC) 08/22/2021   T2DM (type 2 diabetes mellitus) (HCC) 08/22/2021   Stress at home 08/22/2021   Family history of breast cancer 08/10/2016   Adult hypothyroidism 06/11/2014   Avitaminosis D 06/11/2014   Hyperlipidemia associated with type 2 diabetes mellitus (HCC) 05/07/2006    PCP: Erasmo Downer,  MD  REFERRING PROVIDER: Erasmo Downer, MD  REFERRING DIAG: [phone] (ICD-10-CM) - Unilateral primary osteoarthritis, left knee  THERAPY DIAG:  Chronic left shoulder pain  Decreased ROM of left shoulder  Rationale for Evaluation and Treatment: Rehabilitation  ONSET DATE: Chronic for many years (3-4 years ago)  SUBJECTIVE:  SUBJECTIVE STATEMENT: 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 She has had a steroid pack and also corticosteroid injection in her L shoulder which has made her mobility more tolerable. Has also been to massage therapy and the chiropractor for her neck/upper trap area. Pain ranging recently 3-4/10 NPS with movement and resting pain. Was having upwards to 6/10 NPS prior to steroids with motion. Resting pain 2-3/10 NPS.  Hand dominance: Right  PERTINENT HISTORY: Pt is well known to PT clinic and returning for her ongoing L shoulder pain. Had good success with prior PT bout. Has had weight gain and has not been relatively compliant with HEP. Pain is anterior. Denies N/T down LUE. No pain with neck related movements. Painful movements include shoulder extension and behind the back, laying on her L side at night. Has had good success with massage therapy to her pectoralis major. Has limited L shoulder AROM in flexion and is painful. Worst pain up to 8/10 NPS, no pain at rest. Some days has pain 2-3/10 NPS with activity.   PAIN:  Are you having pain? Yes: NPRS scale: 3-4/10 Pain location: Anterior shoulder Pain description: Sharp, stabbing Aggravating factors: Laying on L side, overhead reaching, behind back Relieving factors: Rest, prior exercises, massage therapy.   PRECAUTIONS: None  RED FLAGS: None   WEIGHT BEARING RESTRICTIONS: No  FALLS:  Has patient  fallen in last 6 months? No  LIVING ENVIRONMENT: Lives with: lives with their spouse  OCCUPATION: Retired  PLOF: Independent  PATIENT GOALS: Don/doff her bra with less pain or no pain  NEXT MD VISIT: N/A  OBJECTIVE:  Note: Objective measures were completed at Evaluation unless otherwise noted.  DIAGNOSTIC FINDINGS:  N/A  PATIENT SURVEYS :  Quick Dash deferred and FOTO deferred  COGNITION: Overall cognitive status: Within functional limits for tasks assessed     SENSATION: WFL  POSTURE: Forward head, rounded shoulders  CERVICAL AROM: Flexion: 65 deg Extension: 35 deg Rotation R/L: 49/50 deg Lateral flexion R/L:  30 deg/ 40 deg  UPPER EXTREMITY ROM:   Active ROM Right eval Left eval  Shoulder flexion 180 134*  Shoulder extension 70 45*  Shoulder abduction 160 125*  Shoulder adduction 80   Shoulder internal rotation 90 (90 PROM) 69* (75 PROM)  Shoulder external rotation 80 (90 PROM) 40 (60 PROM)  Elbow flexion    Elbow extension    Wrist flexion    Wrist extension    Wrist ulnar deviation    Wrist radial deviation    Wrist pronation    Wrist supination    (Blank rows = not tested)  UPPER EXTREMITY MMT:  MMT Right eval Left eval  Shoulder flexion 5 4*  Shoulder extension    Shoulder abduction 5 5*  Shoulder adduction    Shoulder internal rotation 5 5  Shoulder external rotation 5 4*  Middle trapezius    Lower trapezius    Elbow flexion    Elbow extension    Wrist flexion    Wrist extension    Wrist ulnar deviation    Wrist radial deviation    Wrist pronation    Wrist supination    Grip strength (lbs)    (Blank rows = not tested)  SHOULDER SPECIAL TESTS: Impingement tests: Neer impingement test: positive , Hawkins/Kennedy impingement test: positive , and Painful arc test: negative Rotator cuff assessment: Empty can test: positive  and Full can test: positive    JOINT MOBILITY TESTING:  Hypomobile at GHJ in all  planes  PALPATION:   TTP at L pec major near insertion. Palpable active trigger points appreciated with palpation.    TODAY'S TREATMENT:                                                                                                                                         DATE: 02/21/23  There.ex: Reviewing STG's and LTG's. See clinical impression and goals section for details.   Updated HEP to overhead pulley's flexion and abduction. X20/direction.   PATIENT EDUCATION: Education details: Prognosis, POC, HEP Person educated: Patient Education method: Explanation, Demonstration, Tactile cues, Verbal cues, and Handouts Education comprehension: verbalized understanding  HOME EXERCISE PROGRAM: Access Code: [email or web address] URL: [email or web address] Date: 01/04/2023 Prepared by: Ronnie Derby  Exercises - Seated Upper Trapezius Stretch  - 1 x daily - 7 x weekly - 1 sets - 3 reps - 30 hold - Seated Cervical Rotation AROM  - 1 x daily - 7 x weekly - 2 sets - 12 reps - Doorway Pec Stretch at 90 Degrees Abduction  - 1 x daily - 7 x weekly - 1 sets - 3 reps - 30 hold - Supine Shoulder Flexion AAROM with Hands Clasped  - 1 x daily - 7 x weekly - 2 sets - 12 reps - Supine Shoulder External and Internal Rotation in Abduction with Dumbbell  - 1 x daily - 7 x weekly - 2 sets - 12 reps - Standing Shoulder Extension with Dowel  - 1 x daily - 7 x weekly - 2 sets - 12 reps  Access Code: [email or web address] URL: [email or web address] Date: 12/19/2022 Prepared by: Ronnie Derby  Exercises - Seated Upper Trapezius Stretch  - 1 x daily - 7 x weekly - 1 sets - 3 reps - 30 hold - Seated Cervical Rotation AROM  - 1 x daily - 7 x weekly - 2 sets - 12 reps - Doorway Pec Stretch at 90 Degrees Abduction  - 1 x daily - 7 x weekly - 1 sets - 3 reps - 30 hold - Supine Shoulder Flexion AAROM with Hands Clasped  - 1 x daily - 7 x weekly - 2 sets - 12 reps  ASSESSMENT:  CLINICAL IMPRESSION: Pt returning to PT for  recert/re-eval. Pt remains with mild limitations in shoulder flexion, ER, IR, and abduction. Anticipate deficits are not as severe as priorly reported due to previous corticosteroid injection. Pt remains limited primarily with overhead ADL's and behind the back ADL's like donning/doffing her bra due to shoulder extension/IR limitations and pain. Sign/symptoms remain consistent with impingement related type pain. Discussion today on updated HEP and also stress management for pain relief and modulation of symptoms. Planning on prolonged POC of 12 weeks as pt continues to travel regularly for family matters. Pt understanding of updated HEP. Pt will benefit from skilled PT services to address these impairments and maximize return to pain free PLOF and  improve QoL.  OBJECTIVE IMPAIRMENTS: decreased mobility, decreased ROM, decreased strength, hypomobility, impaired flexibility, postural dysfunction, and pain.   ACTIVITY LIMITATIONS: sleeping, dressing, reach over head, and hygiene/grooming  PARTICIPATION LIMITATIONS: community activity and yard work  PERSONAL FACTORS: Age, Fitness, Past/current experiences, and Time since onset of injury/illness/exacerbation are also affecting patient's functional outcome.   REHAB POTENTIAL: Good  CLINICAL DECISION MAKING: Stable/uncomplicated  EVALUATION COMPLEXITY: Low  GOALS: Goals reviewed with patient? No  SHORT TERM GOALS: Target date: 03/21/23  Pt will be independent with HEP to improve L shoulder pain, AROM, and strength for overhead and dressing ADL's. Baseline: 11/19: provided HEP; 02/21/23: has stopped HEP due to pain Goal status: Not met  LONG TERM GOALS: Target date: 05/16/23  Pt will improve FOTO to target score to demonstrate clinically significant improvement in functional mobility.  Baseline: 12/19/22: deferred to next session; 01/04/23: 62/71; 02/21/23: 63/67 Goal status: ON GOING  2.  Pt will improve L shoulder AROM in flexion, ER, IR, and  extension by at least 7 degrees to improve overhead and behind the back ADL's.  Baseline:  Active/(PROM) ROM Right eval Left eval Left 02/21/23  Shoulder flexion 180 134* 145 (151 PROM)  Shoulder extension 70 45* 45*  Shoulder abduction 160 125* 91* (130 PROM)  Shoulder adduction 80    Shoulder internal rotation 90 (90 PROM) 69* (75 PROM) 82* (90 *PROM)  Shoulder external rotation 80 (90 PROM) 40 (60 PROM) 62 (65 PROM)    Goal status: PARTIALLY MET  3.  Pt will report worst pain as a 4/10 NPS with LUE use to demonstrate significant improvement in reduced pain levels.  Baseline: 11/19: 8/10 NPS; 02/21/23: 6/10 NPS Goal status: ONGOING  4.  Pt will be able to don/doff bra using L shoulder to improve dressing ADL's. Baseline: 12/19/22: unable; 02/21/23: unable Goal status: ONGOING  5. Pt will improve Quick DASH score by at least 10% to demonstrate clinically significant improvement in function with L shoulder use.   Baseline: 01/04/23: 18.2%; 02/21/23: deferred to next session  Goal status: INITIAL  PLAN: PT FREQUENCY: 1-2x/week  PT DURATION: 12 weeks  PLANNED INTERVENTIONS: 97164- PT Re-evaluation, 97110-Therapeutic exercises, 97530- Therapeutic activity, 97112- Neuromuscular re-education, 97535- Self Care, 09811- Manual therapy, 97014- Electrical stimulation (unattended), [phone]- Electrical stimulation (manual), Patient/Family education, Dry Needling, Joint mobilization, Joint manipulation, Spinal manipulation, Spinal mobilization, Cryotherapy, and Moist heat  PLAN FOR NEXT SESSION: L shoulder AAROM and periscapular strengthening.    Delphia Grates. Fairly IV, PT, DPT Physical Therapist- [place]  [facility]  02/21/2023, 9:25 AM

## 2023-02-27 DIAGNOSIS — M5414 Radiculopathy, thoracic region: Secondary | ICD-10-CM | POA: Diagnosis not present

## 2023-02-27 DIAGNOSIS — M542 Cervicalgia: Secondary | ICD-10-CM | POA: Diagnosis not present

## 2023-02-27 DIAGNOSIS — M9901 Segmental and somatic dysfunction of cervical region: Secondary | ICD-10-CM | POA: Diagnosis not present

## 2023-02-27 DIAGNOSIS — M9902 Segmental and somatic dysfunction of thoracic region: Secondary | ICD-10-CM | POA: Diagnosis not present

## 2023-02-28 ENCOUNTER — Ambulatory Visit: Payer: Medicare PPO

## 2023-02-28 DIAGNOSIS — M25612 Stiffness of left shoulder, not elsewhere classified: Secondary | ICD-10-CM

## 2023-02-28 DIAGNOSIS — G8929 Other chronic pain: Secondary | ICD-10-CM

## 2023-02-28 DIAGNOSIS — M25512 Pain in left shoulder: Secondary | ICD-10-CM | POA: Diagnosis not present

## 2023-02-28 NOTE — Therapy (Signed)
OUTPATIENT PHYSICAL THERAPY UPPER EXTREMITY TREATMENT   Patient Name: Erica Pineda MRN: [id number] DOB:11/12/1950, 73 y.o., female Today's Date: 02/28/2023  END OF SESSION:  PT End of Session - 02/28/23 1034     Visit Number 4    Number of Visits 17    Date for PT Re-Evaluation 05/16/23    PT Start Time 1033    PT Stop Time 1115    PT Time Calculation (min) 42 min    Activity Tolerance Patient tolerated treatment well    Behavior During Therapy WFL for tasks assessed/performed             Past Medical History:  Diagnosis Date   Allergy    Arthritis    thumbs   Cancer (HCC)    Thyroid-follwed by Endocrine   Cataract    surgery 2021 (?)   Diabetes mellitus without complication (HCC)    I'm not sure what mellitus is   Hyperlipidemia    Past Surgical History:  Procedure Laterality Date   ABDOMINAL HYSTERECTOMY     APPENDECTOMY  1996   BUNIONECTOMY  05/2007   and hammer-toe   CATARACT EXTRACTION Bilateral    COSMETIC SURGERY  2024   eyelids   EYE SURGERY  2021 (?)   cataracts   HERNIA REPAIR Left 1993   with mesh   HYMENECTOMY  1971   INCONTINENCE SURGERY  1989   TONSILLECTOMY AND ADENOIDECTOMY  1972   TOTAL THYROIDECTOMY  02/08/2009   TUBAL LIGATION  1989   after 3rd pregnancy   Patient Active Problem List   Diagnosis Date Noted   Urinary incontinence, mixed 10/27/2022   Nocturia 10/27/2022   Vaginal atrophy 10/27/2022   Insomnia 05/02/2022   Chronic left shoulder pain 05/02/2022   S/P eye surgery 04/06/2022   Dermatochalasis of both upper eyelids 10/24/2021   Hypertension associated with diabetes (HCC) 08/23/2021   Morbid obesity (HCC) 08/22/2021   T2DM (type 2 diabetes mellitus) (HCC) 08/22/2021   Stress at home 08/22/2021   Family history of breast cancer 08/10/2016   Adult hypothyroidism 06/11/2014   Avitaminosis D 06/11/2014   Hyperlipidemia associated with type 2 diabetes mellitus (HCC) 05/07/2006    PCP: Erasmo Downer, MD  REFERRING  PROVIDER: Erasmo Downer, MD  REFERRING DIAG: [phone] (ICD-10-CM) - Unilateral primary osteoarthritis, left knee  THERAPY DIAG:  Chronic left shoulder pain  Decreased ROM of left shoulder  Rationale for Evaluation and Treatment: Rehabilitation  ONSET DATE: Chronic for many years (3-4 years ago)  SUBJECTIVE:  SUBJECTIVE STATEMENT: Pt reports 3/10 L shoulder pain. Cervical pain has improved with HEP, heating pad. Updated Hep has helped the shoulder. Hand dominance: Right  PERTINENT HISTORY: Pt is well known to PT clinic and returning for her ongoing L shoulder pain. Had good success with prior PT bout. Has had weight gain and has not been relatively compliant with HEP. Pain is anterior. Denies N/T down LUE. No pain with neck related movements. Painful movements include shoulder extension and behind the back, laying on her L side at night. Has had good success with massage therapy to her pectoralis major. Has limited L shoulder AROM in flexion and is painful. Worst pain up to 8/10 NPS, no pain at rest. Some days has pain 2-3/10 NPS with activity.   PAIN:  Are you having pain? Yes: NPRS scale: 3/10 Pain location: Anterior shoulder Pain description: Sharp, stabbing Aggravating factors: Laying on L side, overhead reaching, behind back Relieving factors: Rest, prior exercises, massage therapy.   PRECAUTIONS: None  RED FLAGS: None   WEIGHT BEARING RESTRICTIONS: No  FALLS:  Has patient fallen in last 6 months? No  LIVING ENVIRONMENT: Lives with: lives with their spouse  OCCUPATION: Retired  PLOF: Independent  PATIENT GOALS: Don/doff her bra with less pain or no pain  NEXT MD VISIT: N/A  OBJECTIVE:  Note: Objective measures were completed at Evaluation unless otherwise noted.  DIAGNOSTIC  FINDINGS:  N/A  PATIENT SURVEYS :  Quick Dash deferred and FOTO deferred  COGNITION: Overall cognitive status: Within functional limits for tasks assessed     SENSATION: WFL  POSTURE: Forward head, rounded shoulders  CERVICAL AROM: Flexion: 65 deg Extension: 35 deg Rotation R/L: 49/50 deg Lateral flexion R/L:  30 deg/ 40 deg  UPPER EXTREMITY ROM:   Active ROM Right eval Left eval  Shoulder flexion 180 134*  Shoulder extension 70 45*  Shoulder abduction 160 125*  Shoulder adduction 80   Shoulder internal rotation 90 (90 PROM) 69* (75 PROM)  Shoulder external rotation 80 (90 PROM) 40 (60 PROM)  Elbow flexion    Elbow extension    Wrist flexion    Wrist extension    Wrist ulnar deviation    Wrist radial deviation    Wrist pronation    Wrist supination    (Blank rows = not tested)  UPPER EXTREMITY MMT:  MMT Right eval Left eval  Shoulder flexion 5 4*  Shoulder extension    Shoulder abduction 5 5*  Shoulder adduction    Shoulder internal rotation 5 5  Shoulder external rotation 5 4*  Middle trapezius    Lower trapezius    Elbow flexion    Elbow extension    Wrist flexion    Wrist extension    Wrist ulnar deviation    Wrist radial deviation    Wrist pronation    Wrist supination    Grip strength (lbs)    (Blank rows = not tested)  SHOULDER SPECIAL TESTS: Impingement tests: Neer impingement test: positive , Hawkins/Kennedy impingement test: positive , and Painful arc test: negative Rotator cuff assessment: Empty can test: positive  and Full can test: positive    JOINT MOBILITY TESTING:  Hypomobile at GHJ in all planes  PALPATION:  TTP at L pec major near insertion. Palpable active trigger points appreciated with palpation.    TODAY'S TREATMENT:  DATE: 02/28/23  There.exNino Parsley DASH: 20.5%  Hook lying:  X5, 10  sec holds deep neck flexor activation for paraspinal and anterior cervical stability.   Seated LUE pulley's:   Flexion: 2x15   Abduction: 2x15  Manual Therapy: Supine for pain modulation. MH applied to L periscapulars and upper traps for tissue extensibility.  L GHJ 4x15 sec bouts grade 2 for pain relief AP/PA per direction   L GHJ 4x15 sec bouts grade 2-3 inferiorly for pain relief and improved shoulder abduction AROM   L upper trap stretch with L shoulder OP by PT: 4x30 sec bouts.   PATIENT EDUCATION: Education details: Prognosis, POC, HEP Person educated: Patient Education method: Explanation, Demonstration, Tactile cues, Verbal cues, and Handouts Education comprehension: verbalized understanding  HOME EXERCISE PROGRAM: Access Code: [email or web address] URL: [email or web address] Date: 01/04/2023 Prepared by: Ronnie Derby  Exercises - Seated Upper Trapezius Stretch  - 1 x daily - 7 x weekly - 1 sets - 3 reps - 30 hold - Seated Cervical Rotation AROM  - 1 x daily - 7 x weekly - 2 sets - 12 reps - Doorway Pec Stretch at 90 Degrees Abduction  - 1 x daily - 7 x weekly - 1 sets - 3 reps - 30 hold - Supine Shoulder Flexion AAROM with Hands Clasped  - 1 x daily - 7 x weekly - 2 sets - 12 reps - Supine Shoulder External and Internal Rotation in Abduction with Dumbbell  - 1 x daily - 7 x weekly - 2 sets - 12 reps - Standing Shoulder Extension with Dowel  - 1 x daily - 7 x weekly - 2 sets - 12 reps  Access Code: [email or web address] URL: [email or web address] Date: 12/19/2022 Prepared by: Ronnie Derby  Exercises - Seated Upper Trapezius Stretch  - 1 x daily - 7 x weekly - 1 sets - 3 reps - 30 hold - Seated Cervical Rotation AROM  - 1 x daily - 7 x weekly - 2 sets - 12 reps - Doorway Pec Stretch at 90 Degrees Abduction  - 1 x daily - 7 x weekly - 1 sets - 3 reps - 30 hold - Supine Shoulder Flexion AAROM with Hands Clasped  - 1 x daily - 7 x weekly - 2 sets - 12  reps  ASSESSMENT:  CLINICAL IMPRESSION: Heavy focus today on pain modulation, tissue extensibility, and cervical flexibility to improve L shoulder pain. Also finishing up subjective survey from prior session. Pt scoring a little worse than prior reading which is expected as pt has had exacerbation of symptoms lately. Pain greatly better managed with AAROM in pain free ranges and addressing cervical limitations. Will continue to progress shoulder mobility and strength to tolerance. Pt will benefit from skilled PT services to address these impairments and maximize return to pain free PLOF and improve QoL.  OBJECTIVE IMPAIRMENTS: decreased mobility, decreased ROM, decreased strength, hypomobility, impaired flexibility, postural dysfunction, and pain.   ACTIVITY LIMITATIONS: sleeping, dressing, reach over head, and hygiene/grooming  PARTICIPATION LIMITATIONS: community activity and yard work  PERSONAL FACTORS: Age, Fitness, Past/current experiences, and Time since onset of injury/illness/exacerbation are also affecting patient's functional outcome.   REHAB POTENTIAL: Good  CLINICAL DECISION MAKING: Stable/uncomplicated  EVALUATION COMPLEXITY: Low  GOALS: Goals reviewed with patient? No  SHORT TERM GOALS: Target date: 03/21/23  Pt will be independent with HEP to improve L shoulder pain, AROM, and strength for overhead and dressing ADL's. Baseline: 11/19: provided HEP; 02/21/23: has stopped HEP due to pain Goal  status: Not met  LONG TERM GOALS: Target date: 05/16/23  Pt will improve FOTO to target score to demonstrate clinically significant improvement in functional mobility.  Baseline: 12/19/22: deferred to next session; 01/04/23: 62/71; 02/21/23: 63/67 Goal status: ON GOING  2.  Pt will improve L shoulder AROM in flexion, ER, IR, and extension by at least 7 degrees to improve overhead and behind the back ADL's.  Baseline:  Active/(PROM) ROM Right eval Left eval Left 02/21/23  Shoulder  flexion 180 134* 145 (151 PROM)  Shoulder extension 70 45* 45*  Shoulder abduction 160 125* 91* (130 PROM)  Shoulder adduction 80    Shoulder internal rotation 90 (90 PROM) 69* (75 PROM) 82* (90 *PROM)  Shoulder external rotation 80 (90 PROM) 40 (60 PROM) 62 (65 PROM)    Goal status: PARTIALLY MET  3.  Pt will report worst pain as a 4/10 NPS with LUE use to demonstrate significant improvement in reduced pain levels.  Baseline: 11/19: 8/10 NPS; 02/21/23: 6/10 NPS Goal status: ONGOING  4.  Pt will be able to don/doff bra using L shoulder to improve dressing ADL's. Baseline: 12/19/22: unable; 02/21/23: unable Goal status: ONGOING  5. Pt will improve Quick DASH score by at least 10% to demonstrate clinically significant improvement in function with L shoulder use.   Baseline: 01/04/23: 18.2%; 02/21/23: deferred to next session; 02/28/23: 20.5%  Goal status: INITIAL  PLAN: PT FREQUENCY: 1-2x/week  PT DURATION: 12 weeks  PLANNED INTERVENTIONS: 97164- PT Re-evaluation, 97110-Therapeutic exercises, 97530- Therapeutic activity, 97112- Neuromuscular re-education, 97535- Self Care, 29562- Manual therapy, 97014- Electrical stimulation (unattended), [id number]- Electrical stimulation (manual), Patient/Family education, Dry Needling, Joint mobilization, Joint manipulation, Spinal manipulation, Spinal mobilization, Cryotherapy, and Moist heat  PLAN FOR NEXT SESSION: L shoulder AAROM and periscapular strengthening. Cervical/paraspinal stability.    Delphia Grates. Fairly IV, PT, DPT Physical Therapist- [place]  [facility]  02/28/2023, 12:43 PM

## 2023-03-01 NOTE — Telephone Encounter (Signed)
Looks like she just needs a PA for the Ozempic.

## 2023-03-14 ENCOUNTER — Other Ambulatory Visit (HOSPITAL_COMMUNITY): Payer: Self-pay

## 2023-03-19 DIAGNOSIS — M542 Cervicalgia: Secondary | ICD-10-CM | POA: Diagnosis not present

## 2023-03-19 DIAGNOSIS — M9902 Segmental and somatic dysfunction of thoracic region: Secondary | ICD-10-CM | POA: Diagnosis not present

## 2023-03-19 DIAGNOSIS — M5414 Radiculopathy, thoracic region: Secondary | ICD-10-CM | POA: Diagnosis not present

## 2023-03-19 DIAGNOSIS — M9901 Segmental and somatic dysfunction of cervical region: Secondary | ICD-10-CM | POA: Diagnosis not present

## 2023-03-20 ENCOUNTER — Ambulatory Visit: Payer: Medicare PPO | Admitting: Obstetrics

## 2023-03-26 ENCOUNTER — Ambulatory Visit: Payer: Medicare PPO | Attending: Family Medicine

## 2023-03-26 DIAGNOSIS — M25512 Pain in left shoulder: Secondary | ICD-10-CM | POA: Diagnosis not present

## 2023-03-26 DIAGNOSIS — G8929 Other chronic pain: Secondary | ICD-10-CM | POA: Insufficient documentation

## 2023-03-26 DIAGNOSIS — M25612 Stiffness of left shoulder, not elsewhere classified: Secondary | ICD-10-CM | POA: Insufficient documentation

## 2023-03-26 NOTE — Therapy (Signed)
 OUTPATIENT PHYSICAL THERAPY UPPER EXTREMITY TREATMENT   Patient Name: Erica Pineda MRN: [id number] DOB:[birth year], 73 y.o., female Today's Date: 03/26/2023  END OF SESSION:  PT End of Session - 03/26/23 0827     Visit Number 5    Number of Visits 17    Date for PT Re-Evaluation 05/16/23    PT Start Time 0827    PT Stop Time 0900    PT Time Calculation (min) 33 min    Activity Tolerance Patient tolerated treatment well    Behavior During Therapy [facility] for tasks assessed/performed             Past Medical History:  Diagnosis Date   Allergy    Arthritis    thumbs   Cancer (HCC)    Thyroid-follwed by Endocrine   Cataract    surgery 2021 (?)   Diabetes mellitus without complication (HCC)    I'm not sure what mellitus is   Hyperlipidemia    Past Surgical History:  Procedure Laterality Date   ABDOMINAL HYSTERECTOMY     APPENDECTOMY  1996   BUNIONECTOMY  05/2007   and hammer-toe   CATARACT EXTRACTION Bilateral    COSMETIC SURGERY  2024   eyelids   EYE SURGERY  2021 (?)   cataracts   HERNIA REPAIR Left 1993   with mesh   HYMENECTOMY  1971   INCONTINENCE SURGERY  1989   TONSILLECTOMY AND ADENOIDECTOMY  1972   TOTAL THYROIDECTOMY  02/08/2009   TUBAL LIGATION  1989   after 3rd pregnancy   Patient Active Problem List   Diagnosis Date Noted   Urinary incontinence, mixed 10/27/2022   Nocturia 10/27/2022   Vaginal atrophy 10/27/2022   Insomnia 05/02/2022   Chronic left shoulder pain 05/02/2022   S/P eye surgery 04/06/2022   Dermatochalasis of both upper eyelids 10/24/2021   Hypertension associated with diabetes (HCC) 08/23/2021   Morbid obesity (HCC) 08/22/2021   T2DM (type 2 diabetes mellitus) (HCC) 08/22/2021   Stress at home 08/22/2021   Family history of breast cancer 08/10/2016   Adult hypothyroidism 06/11/2014   Avitaminosis D 06/11/2014   Hyperlipidemia associated with type 2 diabetes mellitus (HCC) 05/07/2006    PCP: Erasmo Downer, MD  REFERRING  PROVIDER: Erasmo Downer, MD  REFERRING DIAG: [phone] (ICD-10-CM) - Unilateral primary osteoarthritis, left knee  THERAPY DIAG:  Chronic left shoulder pain  Decreased ROM of left shoulder  Rationale for Evaluation and Treatment: Rehabilitation  ONSET DATE: Chronic for many years (3-4 years ago)  SUBJECTIVE:  SUBJECTIVE STATEMENT: Pt reports stressful 2 weeks with husband being in the hospital. L shoulder overall has been "not bad" despite being not HEP compliant with husband being sick.  Hand dominance: Right  PERTINENT HISTORY: Pt is well known to PT clinic and returning for her ongoing L shoulder pain. Had good success with prior PT bout. Has had weight gain and has not been relatively compliant with HEP. Pain is anterior. Denies N/T down LUE. No pain with neck related movements. Painful movements include shoulder extension and behind the back, laying on her L side at night. Has had good success with massage therapy to her pectoralis major. Has limited L shoulder AROM in flexion and is painful. Worst pain up to 8/10 NPS, no pain at rest. Some days has pain 2-3/10 NPS with activity.   PAIN:  Are you having pain? Yes: NPRS scale: 3/10 Pain location: Anterior shoulder Pain description: Sharp, stabbing Aggravating factors: Laying on L side, overhead reaching, behind back Relieving factors: Rest, prior exercises, massage therapy.   PRECAUTIONS: None  RED FLAGS: None   WEIGHT BEARING RESTRICTIONS: No  FALLS:  Has patient fallen in last 6 months? No  LIVING ENVIRONMENT: Lives with: lives with their spouse  OCCUPATION: Retired  PLOF: Independent  PATIENT GOALS: Don/doff her bra with less pain or no pain  NEXT MD VISIT: N/A  OBJECTIVE:  Note: Objective measures were completed at Evaluation  unless otherwise noted.  DIAGNOSTIC FINDINGS:  N/A  PATIENT SURVEYS :  Quick Dash deferred and FOTO deferred  COGNITION: Overall cognitive status: Within functional limits for tasks assessed     SENSATION: WFL  POSTURE: Forward head, rounded shoulders  CERVICAL AROM: Flexion: 65 deg Extension: 35 deg Rotation R/L: 49/50 deg Lateral flexion R/L:  30 deg/ 40 deg  UPPER EXTREMITY ROM:   Active ROM Right eval Left eval  Shoulder flexion 180 134*  Shoulder extension 70 45*  Shoulder abduction 160 125*  Shoulder adduction 80   Shoulder internal rotation 90 (90 PROM) 69* (75 PROM)  Shoulder external rotation 80 (90 PROM) 40 (60 PROM)  Elbow flexion    Elbow extension    Wrist flexion    Wrist extension    Wrist ulnar deviation    Wrist radial deviation    Wrist pronation    Wrist supination    (Blank rows = not tested)  UPPER EXTREMITY MMT:  MMT Right eval Left eval  Shoulder flexion 5 4*  Shoulder extension    Shoulder abduction 5 5*  Shoulder adduction    Shoulder internal rotation 5 5  Shoulder external rotation 5 4*  Middle trapezius    Lower trapezius    Elbow flexion    Elbow extension    Wrist flexion    Wrist extension    Wrist ulnar deviation    Wrist radial deviation    Wrist pronation    Wrist supination    Grip strength (lbs)    (Blank rows = not tested)  SHOULDER SPECIAL TESTS: Impingement tests: Neer impingement test: positive , Hawkins/Kennedy impingement test: positive , and Painful arc test: negative Rotator cuff assessment: Empty can test: positive  and Full can test: positive    JOINT MOBILITY TESTING:  Hypomobile at GHJ in all planes  PALPATION:  TTP at L pec major near insertion. Palpable active trigger points appreciated with palpation.    TODAY'S TREATMENT:  DATE:  03/26/23  There.exBurnetta Sabin lying:  X5, 15 sec holds deep neck flexor activation for paraspinal and anterior cervical stability.   L shoulder serratus anterior punches: 3x12, 5# DB. Initial max TC's for form/technique. Good carryover with second and third set.   Standing B shoulder extension with blue TB: 3x8   Standing L shoulder rotations CW/CCW: 2 KG med ball, 2x30 sec/direction  Updated HEP with hand out provided. Educated on reps/sets/frequency.    PATIENT EDUCATION: Education details: Prognosis, POC, HEP Person educated: Patient Education method: Explanation, Demonstration, Tactile cues, Verbal cues, and Handouts Education comprehension: verbalized understanding  HOME EXERCISE PROGRAM: Access Code: [email or web address] URL: [email or web address] Date: 03/26/2023 Prepared by: Ronnie Derby  Exercises - Seated Upper Trapezius Stretch  - 1 x daily - 7 x weekly - 1 sets - 3 reps - 30 hold - Seated Cervical Rotation AROM  - 1 x daily - 7 x weekly - 2 sets - 12 reps - Seated Shoulder Flexion AAROM with Pulley Behind  - 1 x daily - 4-5 x weekly - 2 sets - 15 reps - Seated Shoulder Abduction AAROM with Pulley Behind  - 1 x daily - 4-5 x weekly - 2 sets - 15 reps - Supine Deep Neck Flexor Training - Repetitions  - 1 x daily - 2-3 x weekly - 1 sets - 5-6 reps - 10 hold - Shoulder extension with resistance - Neutral  - 1 x daily - 2-3 x weekly - 3 sets - 8 reps   Access Code: [email or web address] URL: [email or web address] Date: 01/04/2023 Prepared by: Ronnie Derby  Exercises - Seated Upper Trapezius Stretch  - 1 x daily - 7 x weekly - 1 sets - 3 reps - 30 hold - Seated Cervical Rotation AROM  - 1 x daily - 7 x weekly - 2 sets - 12 reps - Doorway Pec Stretch at 90 Degrees Abduction  - 1 x daily - 7 x weekly - 1 sets - 3 reps - 30 hold - Supine Shoulder Flexion AAROM with Hands Clasped  - 1 x daily - 7 x weekly - 2 sets - 12 reps - Supine Shoulder External and Internal  Rotation in Abduction with Dumbbell  - 1 x daily - 7 x weekly - 2 sets - 12 reps - Standing Shoulder Extension with Dowel  - 1 x daily - 7 x weekly - 2 sets - 12 reps  Access Code: [email or web address] URL: [email or web address] Date: 12/19/2022 Prepared by: Ronnie Derby  Exercises - Seated Upper Trapezius Stretch  - 1 x daily - 7 x weekly - 1 sets - 3 reps - 30 hold - Seated Cervical Rotation AROM  - 1 x daily - 7 x weekly - 2 sets - 12 reps - Doorway Pec Stretch at 90 Degrees Abduction  - 1 x daily - 7 x weekly - 1 sets - 3 reps - 30 hold - Supine Shoulder Flexion AAROM with Hands Clasped  - 1 x daily - 7 x weekly - 2 sets - 12 reps  ASSESSMENT:  CLINICAL IMPRESSION: Pt late to session limiting treatment time. Focus of session on periscapular strengthening. RTC and anterior cervical stability based exercises. Despite life stressors pt has had little to no L shoulder pain which has been a prior issue for pt. Progressing overall time based exercises and resistance levels to progress strengthening program for long term carryover with LUE use. Pt will benefit from skilled PT services to address these impairments and maximize return to pain  free PLOF and improve QoL.  OBJECTIVE IMPAIRMENTS: decreased mobility, decreased ROM, decreased strength, hypomobility, impaired flexibility, postural dysfunction, and pain.   ACTIVITY LIMITATIONS: sleeping, dressing, reach over head, and hygiene/grooming  PARTICIPATION LIMITATIONS: community activity and yard work  PERSONAL FACTORS: Age, Fitness, Past/current experiences, and Time since onset of injury/illness/exacerbation are also affecting patient's functional outcome.   REHAB POTENTIAL: Good  CLINICAL DECISION MAKING: Stable/uncomplicated  EVALUATION COMPLEXITY: Low  GOALS: Goals reviewed with patient? No  SHORT TERM GOALS: Target date: 03/21/23  Pt will be independent with HEP to improve L shoulder pain, AROM, and strength for overhead  and dressing ADL's. Baseline: 11/19: provided HEP; 02/21/23: has stopped HEP due to pain; 03/26/23; compliant with HEP prior to husband's hospitalization.  Goal status: MET  LONG TERM GOALS: Target date: 05/16/23  Pt will improve FOTO to target score to demonstrate clinically significant improvement in functional mobility.  Baseline: 12/19/22: deferred to next session; 01/04/23: 62/71; 02/21/23: 63/67 Goal status: ON GOING  2.  Pt will improve L shoulder AROM in flexion, ER, IR, and extension by at least 7 degrees to improve overhead and behind the back ADL's.  Baseline:  Active/(PROM) ROM Right eval Left eval Left 02/21/23  Shoulder flexion 180 134* 145 (151 PROM)  Shoulder extension 70 45* 45*  Shoulder abduction 160 125* 91* (130 PROM)  Shoulder adduction 80    Shoulder internal rotation 90 (90 PROM) 69* (75 PROM) 82* (90 *PROM)  Shoulder external rotation 80 (90 PROM) 40 (60 PROM) 62 (65 PROM)    Goal status: PARTIALLY MET  3.  Pt will report worst pain as a 4/10 NPS with LUE use to demonstrate significant improvement in reduced pain levels.  Baseline: 11/19: 8/10 NPS; 02/21/23: 6/10 NPS Goal status: ONGOING  4.  Pt will be able to don/doff bra using L shoulder to improve dressing ADL's. Baseline: 12/19/22: unable; 02/21/23: unable Goal status: ONGOING  5. Pt will improve Quick DASH score by at least 10% to demonstrate clinically significant improvement in function with L shoulder use.   Baseline: 01/04/23: 18.2%; 02/21/23: deferred to next session; 02/28/23: 20.5%  Goal status: INITIAL  PLAN: PT FREQUENCY: 1-2x/week  PT DURATION: 12 weeks  PLANNED INTERVENTIONS: 97164- PT Re-evaluation, 97110-Therapeutic exercises, 97530- Therapeutic activity, 97112- Neuromuscular re-education, 97535- Self Care, 32440- Manual therapy, 97014- Electrical stimulation (unattended), [phone]- Electrical stimulation (manual), Patient/Family education, Dry Needling, Joint mobilization, Joint manipulation,  Spinal manipulation, Spinal mobilization, Cryotherapy, and Moist heat  PLAN FOR NEXT SESSION: L shoulder AROM and periscapular strengthening. Cervical/paraspinal stability.    Delphia Grates. Fairly IV, PT, DPT Physical Therapist- [place]  [facility]  03/26/2023, 9:07 AM

## 2023-03-27 ENCOUNTER — Ambulatory Visit: Payer: Medicare PPO | Admitting: Obstetrics

## 2023-03-27 DIAGNOSIS — M7542 Impingement syndrome of left shoulder: Secondary | ICD-10-CM | POA: Diagnosis not present

## 2023-04-02 ENCOUNTER — Ambulatory Visit: Payer: Medicare PPO | Admitting: Obstetrics

## 2023-04-02 ENCOUNTER — Ambulatory Visit: Payer: Medicare PPO

## 2023-04-02 VITALS — BP 123/70 | HR 89 | Wt 215.0 lb

## 2023-04-02 DIAGNOSIS — N952 Postmenopausal atrophic vaginitis: Secondary | ICD-10-CM

## 2023-04-02 DIAGNOSIS — N3946 Mixed incontinence: Secondary | ICD-10-CM | POA: Diagnosis not present

## 2023-04-02 DIAGNOSIS — R351 Nocturia: Secondary | ICD-10-CM | POA: Diagnosis not present

## 2023-04-02 MED ORDER — ESTRADIOL 0.1 MG/GM VA CREA: TOPICAL_CREAM | VAGINAL | 3 refills | Status: AC

## 2023-04-02 MED ORDER — GEMTESA 75 MG PO TABS: 75.0000 mg | ORAL_TABLET | Freq: Every day | ORAL | 11 refills | Status: DC

## 2023-04-02 NOTE — Assessment & Plan Note (Signed)
-   denies OSA or snoring - advised to avoid night time fluid intake - encouraged optimization of glycemic control - improved to 1x/night with Leslye Peer, continue with Rx sent.

## 2023-04-02 NOTE — Patient Instructions (Addendum)
 Continue Gemtesa 1 tab daily and vaginal estrogen 1g twice a week.

## 2023-04-02 NOTE — Assessment & Plan Note (Signed)
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  While we do not know the exact etiology of OAB, several treatment options exist. We discussed management including behavioral therapy (decreasing bladder irritants, urge suppression strategies, timed voids, bladder retraining), physical therapy, medication; for refractory cases posterior tibial nerve stimulation, sacral neuromodulation, and intravesical botulinum toxin injection.  For anticholinergic medications, we discussed the potential side effects of anticholinergics including dry eyes, dry mouth, constipation, cognitive impairment and urinary retention. For Beta-3 agonist medication, we discussed the potential side effect of elevated blood pressure which is more likely to occur in individuals with uncontrolled hypertension. - Samples provided for [facility] and Rx sent, consider 90 day Rx vs. mirabegron Rx or trospium if cost prohibitive - For treatment of stress urinary incontinence,  non-surgical options include expectant management, weight loss, physical therapy, as well as a pessary.  Surgical options include a midurethral sling, Burch urethropexy, and transurethral injection of a bulking agent. Consider periurethral bulking vs. repeat retropubic midurethral sling - encouraged weight loss and overall wellbeing with support group for dementia caregivers due to stress eating, pending move to [place], [place] for support - maintaining glycemic control - continue vaginal estrogen 1g 2x/week

## 2023-04-02 NOTE — Assessment & Plan Note (Signed)
-   reports vaginal dryness noted with discomfort during intercourse - We discussed the potential risks associated with hormone replacement including stroke, heart attack, and blood clots; and the fact that these risks are very low with vaginal estrogen use due to the very low systemic absorption rate of ~ 0.01% with a twice-week regimen. - continue vaginal estrace with refill sent - discussed association with UTI and urinary symptoms

## 2023-04-02 NOTE — Progress Notes (Signed)
 [facility] Return Visit  SUBJECTIVE  History of Present Illness: Erica Pineda is a 73 y.o. female seen in follow-up for mixed urinary incontinence with possible history of midurethral sling, nocturia, and vaginal atrophy. Plan at last visit was Gemtesa, vaginal estrogen, encouraged weight reduction, and optimization of glycemic control.   Gemtesa with resolution of leakage, voids 1x/night and reports being pleased with results Continuing vaginal estrogen.  Husband with recent illness, went to skilled nursing due to Alzheimer's. Pending move to [place], [place]  Past Medical History: Patient  has a past medical history of Allergy, Arthritis, Cancer (HCC), Cataract, Diabetes mellitus without complication (HCC), and Hyperlipidemia.   Past Surgical History: She  has a past surgical history that includes Total thyroidectomy (02/08/2009); Appendectomy (1996); Bunionectomy (05/2007); Hernia repair (Left, 1993); Incontinence surgery (1989); Tonsillectomy and adenoidectomy (1972); Hymenectomy (1971); Abdominal hysterectomy; Cataract extraction (Bilateral); Cosmetic surgery (2024); Eye surgery (2021 (?)); and Tubal ligation (1989).   Medications: She has a current medication list which includes the following prescription(s): gemtesa, aspirin ec, azelaic acid, biotin, cetirizine hcl, vitamin d3, coenzyme q10, econazole nitrate, estradiol, ezetimibe, hydrocortisone, levothyroxine, rosuvastatin, semaglutide (1 mg/dose), trazodone, valacyclovir, and vitamin d (ergocalciferol).   Allergies: Patient is allergic to percodan  [oxycodone-aspirin] and simvastatin.   Social History: Patient  reports that she has never smoked. She has never used smokeless tobacco. She reports current alcohol use of about 1.0 - 2.0 standard drink of alcohol per week. She reports that she does not use drugs.     OBJECTIVE     Physical Exam: Vitals:   04/02/23 1524  BP: 123/70  Pulse: 89  Weight: 215 lb (97.5 kg)    Gen: No apparent distress, A&O x 3.      No data to display             ASSESSMENT AND PLAN    Ms. Dimmer is a 73 y.o. with:  1. Urinary incontinence, mixed   2. Nocturia   3. 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  While we do not know the exact etiology of OAB, several treatment options exist. We discussed management including behavioral therapy (decreasing bladder irritants, urge suppression strategies, timed voids, bladder retraining), physical therapy, medication; for refractory cases posterior tibial nerve stimulation, sacral neuromodulation, and intravesical botulinum toxin injection.  For anticholinergic medications, we discussed the potential side effects of anticholinergics including dry eyes, dry mouth, constipation, cognitive impairment and urinary retention. For Beta-3 agonist medication, we discussed the potential side effect of elevated blood pressure which is more likely to occur in individuals with uncontrolled hypertension. - Samples provided for [facility] and Rx sent, consider 90 day Rx vs. mirabegron Rx or trospium if cost prohibitive - For treatment of stress urinary incontinence,  non-surgical options include expectant management, weight loss, physical therapy, as well as a pessary.  Surgical options include a midurethral sling, Burch urethropexy, and transurethral injection of a bulking agent. Consider periurethral bulking vs. repeat retropubic midurethral sling - encouraged weight loss  and overall wellbeing with support group for dementia caregivers due to  stress eating, pending move to [place], [place] for support - maintaining glycemic control - continue vaginal estrogen 1g 2x/week  Orders: Leslye Peer; Take 1 tablet (75 mg total) by mouth daily.  Dispense: 30 tablet; Refill: 11 -     Estradiol; Place 0.5-1g twice a week  Dispense: 42 g; Refill: 3  Nocturia Assessment & Plan: - denies OSA or snoring - advised to avoid night time fluid intake - encouraged optimization of glycemic control - improved to 1x/night with Leslye Peer, continue with Rx sent.  Orders: Leslye Peer; Take 1 tablet (75 mg total) by mouth daily.  Dispense: 30 tablet; Refill: 11  Vaginal atrophy Assessment & Plan: - reports vaginal dryness noted with discomfort during intercourse - We discussed the potential risks associated with hormone replacement including stroke, heart attack, and blood clots; and the fact that these risks are very low with vaginal estrogen use due to the very low systemic absorption rate of ~ 0.01% with a twice-week regimen. - continue vaginal estrace with refill sent - discussed association with UTI and urinary symptoms   Orders: -     Estradiol; Place 0.5-1g twice a week  Dispense: 42 g; Refill: 3   Loleta Chance, MD

## 2023-04-10 ENCOUNTER — Other Ambulatory Visit: Payer: Self-pay | Admitting: Family Medicine

## 2023-04-11 ENCOUNTER — Telehealth: Payer: Self-pay

## 2023-04-11 NOTE — Telephone Encounter (Signed)
 Left patient a message to start Valtrex 500 MG BID x 7 days starting 1 day prior to procedure (BBL 04/16/23). Patient has refills at pharmacy.

## 2023-04-11 NOTE — Telephone Encounter (Signed)
 A1C in date.    Requested Prescriptions  Pending Prescriptions Disp Refills   OZEMPIC, 1 MG/DOSE, 4 MG/3ML SOPN [Pharmacy Med Name: OZEMPIC 1 MG/DOSE (4 MG/3 ML)] 3 mL 2    Sig: DIAL AND INJECT UNDER THE SKIN 1 MG WEEKLY     Endocrinology:  Diabetes - GLP-1 Receptor Agonists - semaglutide Failed - 04/11/2023  8:28 AM      Failed - HBA1C in normal range and within 180 days    Hgb A1c MFr Bld  Date Value Ref Range Status  01/15/2023 6.1 (H) 4.8 - 5.6 % Final    Comment:             Prediabetes: 5.7 - 6.4          Diabetes: >6.4          Glycemic control for adults with diabetes: <7.0          Passed - Cr in normal range and within 360 days    Creat  Date Value Ref Range Status  10/10/2016 0.67 0.50 - 0.99 mg/dL Final    Comment:    For patients >46 years of age, the reference limit for Creatinine is approximately 13% higher for people identified as African-American. .    Creatinine, Ser  Date Value Ref Range Status  01/15/2023 0.78 0.57 - 1.00 mg/dL Final         Passed - Valid encounter within last 6 months    Recent Outpatient Visits           2 months ago Annual physical exam   [place] [facility], Marzella Schlein, MD   4 months ago Morbid obesity [facility])   [place] [facility] Erasmo Downer, MD   6 months ago Hypertension associated with diabetes [facility])   [place] [facility], Marzella Schlein, MD   10 months ago Type 2 diabetes mellitus with other specified complication, without long-term current use of insulin [facility])   [place] [facility] [place], Marzella Schlein, MD   11 months ago Primary hypertension   [place] [facility], Marzella Schlein, MD       Future Appointments             In 2 months [place], Marzella Schlein, MD [facility]

## 2023-04-16 ENCOUNTER — Ambulatory Visit (INDEPENDENT_AMBULATORY_CARE_PROVIDER_SITE_OTHER): Payer: Self-pay | Admitting: Dermatology

## 2023-04-16 ENCOUNTER — Ambulatory Visit: Payer: Medicare PPO

## 2023-04-16 DIAGNOSIS — L814 Other melanin hyperpigmentation: Secondary | ICD-10-CM

## 2023-04-16 DIAGNOSIS — L719 Rosacea, unspecified: Secondary | ICD-10-CM

## 2023-04-16 NOTE — Patient Instructions (Signed)

## 2023-04-16 NOTE — Progress Notes (Signed)
 Follow-Up Visit   Subjective  Erica Pineda is a 73 y.o. female who presents for the following: BBL to the face, 3rd treatment.   The following portions of the chart were reviewed this encounter and updated as appropriate: medications, allergies, medical history  Review of Systems:  No other skin or systemic complaints except as noted in HPI or Assessment and Plan.  Objective  Well appearing patient in no apparent distress; mood and affect are within normal limits.  A focused examination was performed of the following areas: Face  Relevant physical exam findings are noted in the Assessment and Plan.  face Scattered tan macules.  face Mid face erythema with telangiectasias.   1st pass  2nd pass  3rd pass  4th pass  Assessment & Plan   LENTIGINES face Photorejuvenation - face Prior to the procedure, the patient's past medical history, medications, allergies, and the rare but potential risks and complications were reviewed with the patient and a signed consent was obtained.  Pre and post treatment care was discussed and instructions provided.   [place] BBL - 04/16/23 1300      Patient Details   Skin Type: II    Anesthestic Cream Applied: No    Photo Takes: No    Consent Signed: Yes      Treatment Details   Date: 04/16/23    Treatment #: 3    Area: face    Filter: 1st Pass; 2nd Pass      1st Pass   Location: Other   Cheeks, forehead   Device: 515 Filter    BBL j/cm2: 12    PW Msec Sec: 10    Cooling Temp: 25    Pulses: 111    15x45: This crystal used.      2nd Pass   Location: Other   nose, upper lip, forehead, chin, temples   Device: 515 Filter    BBL j/cm2: 12    PW Msec Sec: 10    Cooling Temp: 25    Pulses: 120    15x15: This crystal used.    Laser safety: Patient was advised in laser safety.  Patient was fitted with laser safety goggles and advised to keep eyes closed during procedure with goggles on. Staff and provider ensured that patient and  their own safety goggles were also on and eyes protected during procedure. Laser room door was secured and locked from the inside. Laser room door has laser safety sign affixed to the outside of the door.    Patient tolerated the procedure well.   Wynelle Link avoidance was stressed. The patient will call with any problems, questions or concerns prior to their next appointment.   ROSACEA face Rosacea is a chronic progressive skin condition usually affecting the face of adults, causing redness and/or acne bumps. It is treatable but not curable. It sometimes affects the eyes (ocular rosacea) as well. It may respond to topical and/or systemic medication and can flare with stress, sun exposure, alcohol, exercise, topical steroids (including hydrocortisone/cortisone 10) and some foods.  Daily application of broad spectrum spf 30+ sunscreen to face is recommended to reduce flares. Photorejuvenation - face Prior to the procedure, the patient's past medical history, medications, allergies, and the rare but potential risks and complications were reviewed with the patient and a signed consent was obtained.  Pre and post treatment care was discussed and instructions provided.   [place] BBL - 04/16/23 1300      Patient Details   Skin Type: II  Anesthestic Cream Applied: No    Photo Takes: No    Consent Signed: Yes      Treatment Details   Date: 04/16/23    Treatment #: 3    Area: face    Filter: 3rd Pass; 4th Pass       3rd Pass   Location: Other   cheeks, chin, nose   Device: 560 Filter    BBL j/cm2: 17    PW Msec Sec: 15    Cooling Temp: 20    Pulses: 96    15x45: This crystal used.      4th Pass   Location: Other   spot tx - R nasal ala, nasal tip, R paranasal, R med  cheek, chin, L cheek   Device: 560 Filter    BBL j/cm2: 22    PW Msec Sec: 10    Cooling Temp: 20    Pulses: 17    11mm: This crystal used.          Patient tolerated the procedure well.   Wynelle Link avoidance was  stressed. The patient will call with any problems, questions or concerns prior to their next appointment.   Related Medications Azelaic Acid 15 % gel Apply to face 1-2 times a day for rosacea.   Return if symptoms worsen or fail to improve. Patient moving to [place].Wendee Beavers, CMA, am acting as scribe for Willeen Niece, MD .   Documentation: I have reviewed the above documentation for accuracy and completeness, and I agree with the above.  Willeen Niece, MD

## 2023-04-17 ENCOUNTER — Other Ambulatory Visit: Payer: Self-pay

## 2023-04-17 ENCOUNTER — Ambulatory Visit: Attending: Family Medicine

## 2023-04-17 ENCOUNTER — Encounter: Payer: Self-pay | Admitting: Dermatology

## 2023-04-17 DIAGNOSIS — M25512 Pain in left shoulder: Secondary | ICD-10-CM | POA: Diagnosis present

## 2023-04-17 DIAGNOSIS — M25612 Stiffness of left shoulder, not elsewhere classified: Secondary | ICD-10-CM | POA: Diagnosis present

## 2023-04-17 DIAGNOSIS — L719 Rosacea, unspecified: Secondary | ICD-10-CM

## 2023-04-17 DIAGNOSIS — G8929 Other chronic pain: Secondary | ICD-10-CM | POA: Diagnosis present

## 2023-04-17 MED ORDER — AZELAIC ACID 15 % EX GEL: CUTANEOUS | 5 refills | Status: AC

## 2023-04-17 MED ORDER — ECONAZOLE NITRATE 1 % EX CREA: TOPICAL_CREAM | Freq: Two times a day (BID) | CUTANEOUS | 2 refills | Status: AC | PRN

## 2023-04-17 MED ORDER — TRIAMCINOLONE ACETONIDE 0.1 % EX CREA: TOPICAL_CREAM | CUTANEOUS | 2 refills | Status: AC

## 2023-04-17 NOTE — Progress Notes (Signed)
 Refill request

## 2023-04-17 NOTE — Therapy (Signed)
 OUTPATIENT PHYSICAL THERAPY UPPER EXTREMITY DISCHARGE SUMMARY   Patient Name: Erica Pineda MRN: [id number] DOB:[birth year], 73 y.o., female Today's Date: 04/17/2023  END OF SESSION:  PT End of Session - 04/17/23 1349     Visit Number 6    Number of Visits 17    Date for PT Re-Evaluation 05/16/23    PT Start Time 1348    PT Stop Time 1408    PT Time Calculation (min) 20 min    Activity Tolerance Patient tolerated treatment well    Behavior During Therapy WFL for tasks assessed/performed             Past Medical History:  Diagnosis Date   Allergy    Arthritis    thumbs   Cancer (HCC)    Thyroid-follwed by Endocrine   Cataract    surgery 2021 (?)   Diabetes mellitus without complication (HCC)    I'm not sure what mellitus is   Hyperlipidemia    Past Surgical History:  Procedure Laterality Date   ABDOMINAL HYSTERECTOMY     APPENDECTOMY  1996   BUNIONECTOMY  05/2007   and hammer-toe   CATARACT EXTRACTION Bilateral    COSMETIC SURGERY  2024   eyelids   EYE SURGERY  2021 (?)   cataracts   HERNIA REPAIR Left 1993   with mesh   HYMENECTOMY  1971   INCONTINENCE SURGERY  1989   TONSILLECTOMY AND ADENOIDECTOMY  1972   TOTAL THYROIDECTOMY  02/08/2009   TUBAL LIGATION  1989   after 3rd pregnancy   Patient Active Problem List   Diagnosis Date Noted   Urinary incontinence, mixed 10/27/2022   Nocturia 10/27/2022   Vaginal atrophy 10/27/2022   Insomnia 05/02/2022   Chronic left shoulder pain 05/02/2022   S/P eye surgery 04/06/2022   Dermatochalasis of both upper eyelids 10/24/2021   Hypertension associated with diabetes (HCC) 08/23/2021   Morbid obesity (HCC) 08/22/2021   T2DM (type 2 diabetes mellitus) (HCC) 08/22/2021   Stress at home 08/22/2021   Family history of breast cancer 08/10/2016   Adult hypothyroidism 06/11/2014   Avitaminosis D 06/11/2014   Hyperlipidemia associated with type 2 diabetes mellitus (HCC) 05/07/2006    PCP: Erasmo Downer,  MD  REFERRING PROVIDER: Erasmo Downer, MD  REFERRING DIAG: [phone] (ICD-10-CM) - Unilateral primary osteoarthritis, left knee  THERAPY DIAG:  Chronic left shoulder pain  Decreased ROM of left shoulder  Rationale for Evaluation and Treatment: Rehabilitation  ONSET DATE: Chronic for many years (3-4 years ago)  SUBJECTIVE:  SUBJECTIVE STATEMENT: Pt reports needing to d/c for familial stress and husband's health. Feels her L shoulder is vastly improved.  Hand dominance: Right  PERTINENT HISTORY: Pt is well known to PT clinic and returning for her ongoing L shoulder pain. Had good success with prior PT bout. Has had weight gain and has not been relatively compliant with HEP. Pain is anterior. Denies N/T down LUE. No pain with neck related movements. Painful movements include shoulder extension and behind the back, laying on her L side at night. Has had good success with massage therapy to her pectoralis major. Has limited L shoulder AROM in flexion and is painful. Worst pain up to 8/10 NPS, no pain at rest. Some days has pain 2-3/10 NPS with activity.   PAIN:  Are you having pain? Yes: NPRS scale: 0/10 Pain location: Anterior shoulder Pain description: Sharp, stabbing Aggravating factors: Laying on L side, overhead reaching, behind back Relieving factors: Rest, prior exercises, massage therapy.   PRECAUTIONS: None  RED FLAGS: None   WEIGHT BEARING RESTRICTIONS: No  FALLS:  Has patient fallen in last 6 months? No  LIVING ENVIRONMENT: Lives with: lives with their spouse  OCCUPATION: Retired  PLOF: Independent  PATIENT GOALS: Don/doff her bra with less pain or no pain  NEXT MD VISIT: N/A  OBJECTIVE:  Note: Objective measures were completed at Evaluation unless otherwise  noted.  DIAGNOSTIC FINDINGS:  N/A  PATIENT SURVEYS :  Quick Dash deferred and FOTO deferred  COGNITION: Overall cognitive status: Within functional limits for tasks assessed     SENSATION: WFL  POSTURE: Forward head, rounded shoulders  CERVICAL AROM: Flexion: 65 deg Extension: 35 deg Rotation R/L: 49/50 deg Lateral flexion R/L:  30 deg/ 40 deg  UPPER EXTREMITY ROM:   Active ROM Right eval Left eval  Shoulder flexion 180 134*  Shoulder extension 70 45*  Shoulder abduction 160 125*  Shoulder adduction 80   Shoulder internal rotation 90 (90 PROM) 69* (75 PROM)  Shoulder external rotation 80 (90 PROM) 40 (60 PROM)  Elbow flexion    Elbow extension    Wrist flexion    Wrist extension    Wrist ulnar deviation    Wrist radial deviation    Wrist pronation    Wrist supination    (Blank rows = not tested)  UPPER EXTREMITY MMT:  MMT Right eval Left eval  Shoulder flexion 5 4*  Shoulder extension    Shoulder abduction 5 5*  Shoulder adduction    Shoulder internal rotation 5 5  Shoulder external rotation 5 4*  Middle trapezius    Lower trapezius    Elbow flexion    Elbow extension    Wrist flexion    Wrist extension    Wrist ulnar deviation    Wrist radial deviation    Wrist pronation    Wrist supination    Grip strength (lbs)    (Blank rows = not tested)  SHOULDER SPECIAL TESTS: Impingement tests: Neer impingement test: positive , Hawkins/Kennedy impingement test: positive , and Painful arc test: negative Rotator cuff assessment: Empty can test: positive  and Full can test: positive    JOINT MOBILITY TESTING:  Hypomobile at GHJ in all planes  PALPATION:  TTP at L pec major near insertion. Palpable active trigger points appreciated with palpation.    TODAY'S TREATMENT:  DATE: 04/17/23  Physical Performance:   Reviewed remaining goals. See goals section and clinical impression for details.   Reviewed Standing doorway shoulder ER stretch to address remaining shoulder AROM impairment. Reviewed reps/sets/frequency.    PATIENT EDUCATION: Education details: Prognosis, POC, HEP Person educated: Patient Education method: Explanation, Demonstration, Tactile cues, Verbal cues, and Handouts Education comprehension: verbalized understanding  HOME EXERCISE PROGRAM:  Access Code: [email or web address] URL: [email or web address] Date: 04/17/2023 Prepared by: Ronnie Derby  Exercises - Seated Upper Trapezius Stretch  - 1 x daily - 7 x weekly - 1 sets - 3 reps - 30 hold - Seated Cervical Rotation AROM  - 1 x daily - 7 x weekly - 2 sets - 12 reps - Seated Shoulder Flexion AAROM with Pulley Behind  - 1 x daily - 4-5 x weekly - 2 sets - 15 reps - Seated Shoulder Abduction AAROM with Pulley Behind  - 1 x daily - 4-5 x weekly - 2 sets - 15 reps - Supine Deep Neck Flexor Training - Repetitions  - 1 x daily - 2-3 x weekly - 1 sets - 5-6 reps - 10 hold - Shoulder extension with resistance - Neutral  - 1 x daily - 2-3 x weekly - 3 sets - 8 reps - Standing Shoulder External Rotation Stretch in Doorway  - 1 x daily - 2-3 x weekly - 1 sets - 3 reps - 30 hold  Access Code: [email or web address] URL: [email or web address] Date: 03/26/2023 Prepared by: Ronnie Derby  Exercises - Seated Upper Trapezius Stretch  - 1 x daily - 7 x weekly - 1 sets - 3 reps - 30 hold - Seated Cervical Rotation AROM  - 1 x daily - 7 x weekly - 2 sets - 12 reps - Seated Shoulder Flexion AAROM with Pulley Behind  - 1 x daily - 4-5 x weekly - 2 sets - 15 reps - Seated Shoulder Abduction AAROM with Pulley Behind  - 1 x daily - 4-5 x weekly - 2 sets - 15 reps - Supine Deep Neck Flexor Training - Repetitions  - 1 x daily - 2-3 x weekly - 1 sets - 5-6 reps - 10 hold - Shoulder extension with resistance - Neutral  - 1 x daily - 2-3 x weekly  - 3 sets - 8 reps   Access Code: [email or web address] URL: [email or web address] Date: 01/04/2023 Prepared by: Ronnie Derby  Exercises - Seated Upper Trapezius Stretch  - 1 x daily - 7 x weekly - 1 sets - 3 reps - 30 hold - Seated Cervical Rotation AROM  - 1 x daily - 7 x weekly - 2 sets - 12 reps - Doorway Pec Stretch at 90 Degrees Abduction  - 1 x daily - 7 x weekly - 1 sets - 3 reps - 30 hold - Supine Shoulder Flexion AAROM with Hands Clasped  - 1 x daily - 7 x weekly - 2 sets - 12 reps - Supine Shoulder External and Internal Rotation in Abduction with Dumbbell  - 1 x daily - 7 x weekly - 2 sets - 12 reps - Standing Shoulder Extension with Dowel  - 1 x daily - 7 x weekly - 2 sets - 12 reps  Access Code: [email or web address] URL: [email or web address] Date: 12/19/2022 Prepared by: Ronnie Derby  Exercises - Seated Upper Trapezius Stretch  - 1 x daily - 7 x weekly - 1 sets - 3 reps - 30 hold - Seated Cervical Rotation AROM  - 1 x daily - 7 x weekly -  2 sets - 12 reps - Doorway Pec Stretch at 90 Degrees Abduction  - 1 x daily - 7 x weekly - 1 sets - 3 reps - 30 hold - Supine Shoulder Flexion AAROM with Hands Clasped  - 1 x daily - 7 x weekly - 2 sets - 12 reps  ASSESSMENT:  CLINICAL IMPRESSION: Pt agreeable to d/c from PT. Pt has met most of her long term goals with only deficit remaining in shoulder ER. Pt has succeeded in quick Dash, FOTO, pain levels, and ability to don/doff bra without pain indicating significant improvement and reduced disability with functional activity of L shoulder. Provided updated HEP to improve L shoulder ER. Pt understanding of updated HEP with no further questions. Pt formally discharged from PT.   OBJECTIVE IMPAIRMENTS: decreased mobility, decreased ROM, decreased strength, hypomobility, impaired flexibility, postural dysfunction, and pain.   ACTIVITY LIMITATIONS: sleeping, dressing, reach over head, and hygiene/grooming  PARTICIPATION  LIMITATIONS: community activity and yard work  PERSONAL FACTORS: Age, Fitness, Past/current experiences, and Time since onset of injury/illness/exacerbation are also affecting patient's functional outcome.   REHAB POTENTIAL: Good  CLINICAL DECISION MAKING: Stable/uncomplicated  EVALUATION COMPLEXITY: Low  GOALS: Goals reviewed with patient? No  SHORT TERM GOALS: Target date: 03/21/23  Pt will be independent with HEP to improve L shoulder pain, AROM, and strength for overhead and dressing ADL's. Baseline: 11/19: provided HEP; 02/21/23: has stopped HEP due to pain; 03/26/23; compliant with HEP prior to husband's hospitalization.  Goal status: MET  LONG TERM GOALS: Target date: 05/16/23  Pt will improve FOTO to target score to demonstrate clinically significant improvement in functional mobility.  Baseline: 12/19/22: deferred to next session; 01/04/23: 62/71; 02/21/23: 63/67; 04/17/23: 75 Goal status: MET  2.  Pt will improve L shoulder AROM in flexion, ER, IR, and extension by at least 7 degrees to improve overhead and behind the back ADL's.  Baseline:  Active/(PROM) ROM Right eval Left eval Left 02/21/23 Left  04/17/23  Shoulder flexion 180 134* 145 (151 PROM) 160  Shoulder extension 70 45* 45* 70  Shoulder abduction 160 125* 91* (130 PROM)   Shoulder adduction 80     Shoulder internal rotation 90 (90 PROM) 69* (75 PROM) 82* (90 *PROM) 90  Shoulder external rotation 80 (90 PROM) 40 (60 PROM) 62 (65 PROM) 66    Goal status: PARTIALLY MET  3.  Pt will report worst pain as a 4/10 NPS with LUE use to demonstrate significant improvement in reduced pain levels.  Baseline: 11/19: 8/10 NPS; 02/21/23: 6/10 NPS; 04/17/23: 2/10 NPS Goal status: MET  4.  Pt will be able to don/doff bra using L shoulder to improve dressing ADL's. Baseline: 12/19/22: unable; 02/21/23: unable; 04/17/23: donning and doffing without issue Goal status: MET  5. Pt will improve Quick DASH score by at least 10% to  demonstrate clinically significant improvement in function with L shoulder use.   Baseline: 01/04/23: 18.2%; 02/21/23: deferred to next session; 02/28/23: 20.5%; 04/17/23: 4.5%  Goal status: MET  PLAN: PT FREQUENCY: 1-2x/week  PT DURATION: 12 weeks  PLANNED INTERVENTIONS: 97164- PT Re-evaluation, 97110-Therapeutic exercises, 97530- Therapeutic activity, 97112- Neuromuscular re-education, 97535- Self Care, 74259- Manual therapy, 97014- Electrical stimulation (unattended), 97032- Electrical stimulation (manual), Patient/Family education, Dry Needling, Joint mobilization, Joint manipulation, Spinal manipulation, Spinal mobilization, Cryotherapy, and Moist heat  PLAN FOR NEXT SESSION:   Delphia Grates. Fairly IV, PT, DPT Physical Therapist- [place]  [facility]  04/17/2023, 2:19 PM

## 2023-04-23 ENCOUNTER — Encounter: Payer: Self-pay | Admitting: Dermatology

## 2023-05-28 ENCOUNTER — Encounter: Payer: Self-pay | Admitting: Family Medicine

## 2023-05-28 DIAGNOSIS — E1169 Type 2 diabetes mellitus with other specified complication: Secondary | ICD-10-CM

## 2023-05-28 DIAGNOSIS — E039 Hypothyroidism, unspecified: Secondary | ICD-10-CM

## 2023-05-28 DIAGNOSIS — E1159 Type 2 diabetes mellitus with other circulatory complications: Secondary | ICD-10-CM

## 2023-05-28 DIAGNOSIS — E559 Vitamin D deficiency, unspecified: Secondary | ICD-10-CM

## 2023-05-30 ENCOUNTER — Ambulatory Visit: Admitting: Dermatology

## 2023-05-30 ENCOUNTER — Encounter: Payer: Self-pay | Admitting: Dermatology

## 2023-05-30 DIAGNOSIS — L729 Follicular cyst of the skin and subcutaneous tissue, unspecified: Secondary | ICD-10-CM

## 2023-05-30 DIAGNOSIS — D692 Other nonthrombocytopenic purpura: Secondary | ICD-10-CM | POA: Diagnosis not present

## 2023-05-30 DIAGNOSIS — L720 Epidermal cyst: Secondary | ICD-10-CM | POA: Diagnosis not present

## 2023-05-30 MED ORDER — DOXYCYCLINE MONOHYDRATE 100 MG PO CAPS: 100.0000 mg | ORAL_CAPSULE | Freq: Two times a day (BID) | ORAL | 2 refills | Status: AC

## 2023-05-30 NOTE — Patient Instructions (Signed)
 Start Doxycycline  monohydrate 100 mg 1 by mouth twice a day 1-2 weeks as needed for flares. Take with food  Doxycycline  should be taken with food to prevent nausea. Do not lay down for 30 minutes after taking. Be cautious with sun exposure and use good sun protection while on this medication. Pregnant women should not take this medication    Recommend daily broad spectrum sunscreen SPF 30+ to sun-exposed areas, reapply every 2 hours as needed. Call for new or changing lesions.  Staying in the shade or wearing long sleeves, sun glasses (UVA+UVB protection) and wide brim hats (4-inch brim around the entire circumference of the hat) are also recommended for sun protection.     Due to recent changes in healthcare laws, you may see results of your pathology and/or laboratory studies on MyChart before the doctors have had a chance to review them. We understand that in some cases there may be results that are confusing or concerning to you. Please understand that not all results are received at the same time and often the doctors may need to interpret multiple results in order to provide you with the best plan of care or course of treatment. Therefore, we ask that you please give us  2 business days to thoroughly review all your results before contacting the office for clarification. Should we see a critical lab result, you will be contacted sooner.   If You Need Anything After Your Visit  If you have any questions or concerns for your doctor, please call our main line at [phone] and press option 4 to reach your doctor's medical assistant. If no one answers, please leave a voicemail as directed and we will return your call as soon as possible. Messages left after 4 pm will be answered the following business day.   You may also send us  a message via MyChart. We typically respond to MyChart messages within 1-2 business days.  For prescription refills, please ask your pharmacy to contact our office.  Our fax number is [phone].  If you have an urgent issue when the clinic is closed that cannot wait until the next business day, you can page your doctor at the number below.    Please note that while we do our best to be available for urgent issues outside of office hours, we are not available 24/7.   If you have an urgent issue and are unable to reach us , you may choose to seek medical care at your doctor's office, retail clinic, urgent care center, or emergency room.  If you have a medical emergency, please immediately call 911 or go to the emergency department.  Pager Numbers  - Dr. Bary Likes: [phone]  - Dr. Annette Barters: [phone]  - Dr. Felipe Horton: [phone]   In the event of inclement weather, please call our main line at [phone] for an update on the status of any delays or closures.  Dermatology Medication Tips: Please keep the boxes that topical medications come in in order to help keep track of the instructions about where and how to use these. Pharmacies typically print the medication instructions only on the boxes and not directly on the medication tubes.   If your medication is too expensive, please contact our office at [phone] option 4 or send us  a message through MyChart.   We are unable to tell what your co-pay for medications will be in advance as this is different depending on your insurance coverage. However, we may be able to find a substitute medication  at lower cost or fill out paperwork to get insurance to cover a needed medication.   If a prior authorization is required to get your medication covered by your insurance company, please allow us  1-2 business days to complete this process.  Drug prices often vary depending on where the prescription is filled and some pharmacies may offer cheaper prices.  The website www.goodrx.com contains coupons for medications through different pharmacies. The prices here do not account for what the cost may be  with help from insurance (it may be cheaper with your insurance), but the website can give you the price if you did not use any insurance.  - You can print the associated coupon and take it with your prescription to the pharmacy.  - You may also stop by our office during regular business hours and pick up a GoodRx coupon card.  - If you need your prescription sent electronically to a different pharmacy, notify our office through [facility] or by phone at [phone] option 4.     Si Usted Necesita Algo Despus de Su Visita  Tambin puede enviarnos un mensaje a travs de Clinical cytogeneticist. Por lo general respondemos a los mensajes de MyChart en el transcurso de 1 a 2 das hbiles.  Para renovar recetas, por favor pida a su farmacia que se ponga en contacto con nuestra oficina. Franz Jacks de fax es [place] [phone].  Si tiene un asunto urgente cuando la clnica est cerrada y que no puede esperar hasta el siguiente da hbil, puede llamar/localizar a su doctor(a) al nmero que aparece a continuacin.   Por favor, tenga en cuenta que aunque hacemos todo lo posible para estar disponibles para asuntos urgentes fuera del horario de [place], no estamos disponibles las 24 horas del da, los 7 [address]  [address] de la [place].   Si tiene un problema urgente y no puede comunicarse con nosotros, puede optar por buscar atencin mdica  en el consultorio de su doctor(a), en una clnica privada, en un centro de atencin urgente o en una sala de emergencias.  Si tiene Engineer, drilling, por favor llame inmediatamente al 911 o vaya a la sala de emergencias.  Nmeros de bper  - Dr. Bary Likes: [phone]  - Dra. Annette Barters: [phone]  - Dr. Felipe Horton: [phone]   En caso de inclemencias del tiempo, por favor llame a Lajuan Pila principal al [phone] para una actualizacin sobre el [place] de cualquier retraso o cierre.  Consejos para la medicacin en dermatologa: Por favor, guarde las cajas en las que  vienen los medicamentos de uso tpico para ayudarle a seguir las instrucciones sobre dnde y cmo usarlos. Las farmacias generalmente imprimen las instrucciones del medicamento slo en las cajas y no directamente en los tubos del [place].   Si su medicamento es muy caro, por favor, pngase en contacto con Bettyjane Brunet llamando al [phone] y presione la opcin 4 o envenos un mensaje a travs de Clinical cytogeneticist.   No podemos decirle cul ser su copago por los medicamentos por adelantado ya que esto es diferente dependiendo de la cobertura de su seguro. Sin embargo, es posible que podamos encontrar un medicamento sustituto a Audiological scientist un formulario para que el seguro cubra el medicamento que se considera necesario.   Si se requiere una autorizacin previa para que su compaa de seguros [place] su medicamento, por favor permtanos de 1 a 2 das hbiles para completar este proceso.  Los precios de los medicamentos varan con frecuencia dependiendo del Environmental consultant de  dnde se surte la receta y alguna farmacias pueden ofrecer precios ms baratos.  El sitio web www.goodrx.com tiene cupones para medicamentos de Health and safety inspector. Los precios aqu no tienen en cuenta lo que podra costar con la ayuda del seguro (puede ser ms barato con su seguro), pero el sitio web puede darle el precio si no utiliz Tourist information centre manager.  - Puede imprimir el cupn correspondiente y llevarlo con su receta a la farmacia.  - Tambin puede pasar por nuestra oficina durante el horario de atencin regular y Education officer, museum una tarjeta de cupones de GoodRx.  - Si necesita que su receta se enve electrnicamente a una farmacia diferente, informe a nuestra oficina a travs de MyChart de [place] o por telfono llamando al [phone] y presione la opcin 4.

## 2023-05-30 NOTE — Telephone Encounter (Signed)
 Okay to order A1c, lipids, CMP, TSH, UACR, vitamin D  to have done for her visit.  Recommend that she get them done at least 2 days before her visit and we will review them at her visit.  Reviewed her diagnoses from her problem list including hyperlipidemia, Khtari, diabetes, hypothyroidism, vitamin D  deficiency.

## 2023-05-30 NOTE — Progress Notes (Signed)
   Follow-Up Visit   Subjective  Erica Pineda is a 73 y.o. female who presents for the following: Spots. Left wrist. Thought was magic marker, will not wipe off. Dark in color. Noticed this week.   Red spot in center of chest. Dr. Annette Barters has looked at it before. Became red and inflamed for ~1 week but has improved.   Moving to [place]  at the end of the month.   The patient has spots, moles and lesions to be evaluated, some may be new or changing and the patient may have concern these could be cancer.    The following portions of the chart were reviewed this encounter and updated as appropriate: medications, allergies, medical history  Review of Systems:  No other skin or systemic complaints except as noted in HPI or Assessment and Plan.  Objective  Well appearing patient in no apparent distress; mood and affect are within normal limits.  A focused examination was performed of the following areas: Chest, left arm   Relevant physical exam findings are noted in the Assessment and Plan.             Assessment & Plan   Purpura - Chronic; persistent and recurrent.  Treatable, but not curable. - Violaceous macule at left dorsal wrist - Benign - Related to trauma, age, sun damage and/or use of blood thinners, chronic use of topical and/or oral steroids - Observe - Can use OTC arnica containing moisturizer such as Dermend Bruise Formula if desired - Call for worsening or other concerns   INFLAMED EPIDERMAL INCLUSION CYST Exam: 0.7 cm firm subcutaneous nodule at mid lower sternum with mild overlying erythema  Benign-appearing. Exam most consistent with an epidermal inclusion cyst. Discussed that a cyst is a benign growth that can grow over time and sometimes get irritated or inflamed. Recommend observation if it is not bothersome. Discussed option of surgical excision to remove it if it is growing, symptomatic, or other changes noted. Please call for new or changing lesions so they  can be evaluated.  Treatment:  Start Doxycycline  monohydrate 100 mg 1 by mouth twice a day 1-2 weeks as needed for flares. Take with food  Doxycycline  should be taken with food to prevent nausea. Do not lay down for 30 minutes after taking. Be cautious with sun exposure and use good sun protection while on this medication. Pregnant women should not take this medication.         Return if symptoms worsen or fail to improve.  I, Jill Parcell, CMA, am acting as scribe for Artemio Larry, MD.   Documentation: I have reviewed the above documentation for accuracy and completeness, and I agree with the above.  Artemio Larry, MD

## 2023-06-02 LAB — LIPID PANEL WITH LDL/HDL RATIO
Cholesterol, Total: 159 mg/dL (ref 100–199)
HDL: 56 mg/dL (ref >39)
LDL Chol Calc (NIH): 88 mg/dL (ref 0–99)
LDL/HDL Ratio: 1.6 ratio (ref 0.0–3.2)
Triglycerides: 78 mg/dL (ref 0–149)
VLDL Cholesterol Cal: 15 mg/dL (ref 5–40)

## 2023-06-02 LAB — MICROALBUMIN / CREATININE URINE RATIO
Creatinine, Urine: 135.5 mg/dL
Microalb/Creat Ratio: <2 mg/g{creat} (ref 0–29)
Microalbumin, Urine: <3 ug/mL

## 2023-06-02 LAB — HEMOGLOBIN A1C
Est. average glucose Bld gHb Est-mCnc: 120 mg/dL
Hgb A1c MFr Bld: 5.8 % — ABNORMAL HIGH (ref 4.8–5.6)

## 2023-06-02 LAB — TSH: TSH: 0.051 u[IU]/mL — ABNORMAL LOW (ref 0.450–4.500)

## 2023-06-02 LAB — VITAMIN D 25 HYDROXY (VIT D DEFICIENCY, FRACTURES): Vit D, 25-Hydroxy: 72.2 ng/mL (ref 30.0–100.0)

## 2023-06-04 ENCOUNTER — Encounter: Payer: Self-pay | Admitting: Family Medicine

## 2023-06-04 ENCOUNTER — Ambulatory Visit: Admitting: Family Medicine

## 2023-06-04 VITALS — BP 120/82 | HR 77 | Ht 68.0 in | Wt 206.0 lb

## 2023-06-04 DIAGNOSIS — E1159 Type 2 diabetes mellitus with other circulatory complications: Secondary | ICD-10-CM

## 2023-06-04 DIAGNOSIS — Z6831 Body mass index (BMI) 31.0-31.9, adult: Secondary | ICD-10-CM

## 2023-06-04 DIAGNOSIS — E1169 Type 2 diabetes mellitus with other specified complication: Secondary | ICD-10-CM

## 2023-06-04 DIAGNOSIS — E785 Hyperlipidemia, unspecified: Secondary | ICD-10-CM

## 2023-06-04 DIAGNOSIS — E039 Hypothyroidism, unspecified: Secondary | ICD-10-CM

## 2023-06-04 DIAGNOSIS — I152 Hypertension secondary to endocrine disorders: Secondary | ICD-10-CM

## 2023-06-04 DIAGNOSIS — E559 Vitamin D deficiency, unspecified: Secondary | ICD-10-CM

## 2023-06-04 MED ORDER — OZEMPIC (1 MG/DOSE) 4 MG/3ML ~~LOC~~ SOPN: 1.0000 mg | PEN_INJECTOR | SUBCUTANEOUS | 5 refills | Status: DC

## 2023-06-04 MED ORDER — ROSUVASTATIN CALCIUM 5 MG PO TABS: 5.0000 mg | ORAL_TABLET | ORAL | 3 refills | Status: AC

## 2023-06-04 MED ORDER — LEVOTHYROXINE SODIUM 112 MCG PO TABS: 112.0000 ug | ORAL_TABLET | Freq: Every day | ORAL | 1 refills | Status: DC

## 2023-06-04 MED ORDER — TRAZODONE HCL 100 MG PO TABS: 100.0000 mg | ORAL_TABLET | Freq: Every evening | ORAL | 5 refills | Status: AC | PRN

## 2023-06-04 MED ORDER — EZETIMIBE 10 MG PO TABS: 10.0000 mg | ORAL_TABLET | Freq: Every day | ORAL | 3 refills | Status: AC

## 2023-06-04 NOTE — Progress Notes (Signed)
 Established patient visit   Patient: Erica Pineda   DOB: [birth year]   73 y.o. Female  MRN: [id number] Visit Date: 06/04/2023  Today's healthcare provider: Aden Agreste, MD   Chief Complaint  Patient presents with   Medical Management of Chronic Issues    Reports symptoms of fatigue   Hypothyroidism   Diabetes   Hyperlipidemia   Subjective    HPI HPI     Medical Management of Chronic Issues    Additional comments: Reports symptoms of fatigue      Last edited by Pasty Bongo, CMA on 06/04/2023  9:54 AM.       Discussed the use of AI scribe software for clinical note transcription with the patient, who gave verbal consent to proceed.  History of Present Illness   Erica Pineda is a 73 year old female with hypertension, hypothyroidism, hyperlipidemia, type 2 diabetes, and morbid obesity who presents for chronic follow-up.  She has lost significant weight since starting Ozempic , decreasing from 250 lbs to 204 lbs, which she feels has improved her ability to manage stress. Her current medications include baby aspirin, Zetia , Synthroid  125 micrograms daily, Ozempic  1 mg weekly, and Crestor  5 mg twice weekly. Recent labs show her A1c has improved to a prediabetic range, and her cholesterol and vitamin D  levels are favorable. Her thyroid  levels may require adjustment due to weight loss.  She experiences sleep disturbances and occasionally uses a sleeping pill, avoiding it if she needs to wake up early. She does not feel fatigued in the morning when she takes it. She also experiences brief episodes of lightheadedness that occur randomly and are not related to positional changes.           06/04/2023    9:53 AM 01/15/2023    2:45 PM 11/27/2022   11:22 AM 10/06/2022   10:47 AM 09/26/2022    3:58 PM  Depression screen PHQ 2/9  Decreased Interest 0 0 0 0 0  Down, Depressed, Hopeless 0 0 1 0 0  PHQ - 2 Score 0 0 1 0 0  Altered sleeping  0 1 0   Tired, decreased energy  0 1 0    Change in appetite  0 1 0   Feeling bad or failure about yourself   0 0 0   Trouble concentrating  0 0 0   Moving slowly or fidgety/restless  0 0 0   Suicidal thoughts  0 0 0   PHQ-9 Score  0 4 0   Difficult doing work/chores  Not difficult at all Not difficult at all Not difficult at all       06/04/2023    9:53 AM 01/15/2023    2:45 PM 11/27/2022   11:23 AM 10/06/2022   10:47 AM  GAD 7 : Generalized Anxiety Score  Nervous, Anxious, on Edge 0 0 0 0  Control/stop worrying 0 0 0 0  Worry too much - different things 0 0 0 0  Trouble relaxing 0 0 1 0  Restless 0 0 0 0  Easily annoyed or irritable 0 0 0 0  Afraid - awful might happen 0 0 0 0  Total GAD 7 Score 0 0 1 0  Anxiety Difficulty Not difficult at all Not difficult at all Not difficult at all      Medications: Outpatient Medications Prior to Visit  Medication Sig   aspirin 81 MG EC tablet Take 81 mg by mouth daily. Swallow whole.   Azelaic  Acid 15 % gel Apply to face 1-2 times a day for rosacea.   Biotin 5 MG TABS Take by mouth daily.   Cetirizine HCl 10 MG CAPS Take 1 capsule by mouth as needed. Reported on 06/30/2015   Cholecalciferol (VITAMIN D3) 1000 units CAPS Take by mouth daily.   Coenzyme Q10 (COQ10 PO) Take by mouth daily.   econazole nitrate  1 % cream Apply topically 2 (two) times daily as needed. For 2-4 weeks or until rash cleared then PRN recurrence   estradiol  (ESTRACE ) 0.1 MG/GM vaginal cream Place 0.5-1g twice a week   hydrocortisone  (ANUSOL -HC) 25 MG suppository Place 1 suppository (25 mg total) rectally 2 (two) times daily as needed for hemorrhoids or anal itching.   valACYclovir  (VALTREX ) 500 MG tablet Take 1 tablet (500 mg total) by mouth 2 (two) times daily. 1 po bid for 7 days starting 1 day prior to procedure   Vibegron  (GEMTESA ) 75 MG TABS Take 1 tablet (75 mg total) by mouth daily.   Vitamin D , Ergocalciferol , (DRISDOL ) 1.25 MG (50000 UNIT) CAPS capsule TAKE 1 CAPSULE BY MOUTH EVERY 7 DAYS    [DISCONTINUED] ezetimibe  (ZETIA ) 10 MG tablet Take 1 tablet (10 mg total) by mouth daily.   [DISCONTINUED] levothyroxine  (SYNTHROID ) 125 MCG tablet Take 1 tablet (125 mcg total) by mouth daily. Except 1.5 pills on Sundays   [DISCONTINUED] OZEMPIC , 1 MG/DOSE, 4 MG/3ML SOPN DIAL AND INJECT UNDER THE SKIN 1 MG WEEKLY   [DISCONTINUED] rosuvastatin  (CRESTOR ) 5 MG tablet Take 1 tablet (5 mg total) by mouth 2 (two) times a week.   [DISCONTINUED] traZODone  (DESYREL ) 100 MG tablet Take 1 tablet (100 mg total) by mouth at bedtime as needed for sleep.   doxycycline  (MONODOX ) 100 MG capsule Take 1 capsule (100 mg total) by mouth 2 (two) times daily. Take with food and drink (Patient not taking: Reported on 06/04/2023)   triamcinolone  cream (KENALOG ) 0.1 % Apply to affected areas rash once to twice daily until improved. Avoid applying to face, groin, and axilla. Use as directed. Long-term use can cause thinning of the skin. (Patient not taking: Reported on 06/04/2023)   No facility-administered medications prior to visit.    Review of Systems     Objective    BP 120/82 (BP Location: Right Arm, Patient Position: Sitting, Cuff Size: Large)   Pulse 77   Ht 5\' 8"  (1.727 m)   Wt 206 lb (93.4 kg)   SpO2 96%   BMI 31.32 kg/m    Physical Exam Vitals reviewed.  Constitutional:      General: She is not in acute distress.    Appearance: Normal appearance. She is well-developed. She is not diaphoretic.  HENT:     Head: Normocephalic and atraumatic.  Eyes:     General: No scleral icterus.    Conjunctiva/sclera: Conjunctivae normal.  Neck:     Thyroid : No thyromegaly.  Cardiovascular:     Rate and Rhythm: Normal rate and regular rhythm.     Heart sounds: Normal heart sounds. No murmur heard. Pulmonary:     Effort: Pulmonary effort is normal. No respiratory distress.     Breath sounds: Normal breath sounds. No wheezing, rhonchi or rales.  Musculoskeletal:     Cervical back: Neck supple.     Right lower  leg: No edema.     Left lower leg: No edema.  Lymphadenopathy:     Cervical: No cervical adenopathy.  Skin:    General: Skin is warm and dry.  Findings: No rash.  Neurological:     Mental Status: She is alert and oriented to person, place, and time. Mental status is at baseline.  Psychiatric:        Mood and Affect: Mood normal.        Behavior: Behavior normal.      No results found for any visits on 06/04/23.  Assessment & Plan     Problem List Items Addressed This Visit       Cardiovascular and Mediastinum   Hypertension associated with diabetes (HCC) - Primary   Relevant Medications   ezetimibe  (ZETIA ) 10 MG tablet   Semaglutide , 1 MG/DOSE, (OZEMPIC , 1 MG/DOSE,) 4 MG/3ML SOPN   rosuvastatin  (CRESTOR ) 5 MG tablet     Endocrine   Hyperlipidemia associated with type 2 diabetes mellitus (HCC)   Relevant Medications   ezetimibe  (ZETIA ) 10 MG tablet   Semaglutide , 1 MG/DOSE, (OZEMPIC , 1 MG/DOSE,) 4 MG/3ML SOPN   rosuvastatin  (CRESTOR ) 5 MG tablet   Adult hypothyroidism   Relevant Medications   levothyroxine  (SYNTHROID ) 112 MCG tablet   Other Relevant Orders   TSH   T2DM (type 2 diabetes mellitus) (HCC)   Relevant Medications   Semaglutide , 1 MG/DOSE, (OZEMPIC , 1 MG/DOSE,) 4 MG/3ML SOPN   rosuvastatin  (CRESTOR ) 5 MG tablet     Other   Avitaminosis D   Morbid obesity (HCC)   Relevant Medications   Semaglutide , 1 MG/DOSE, (OZEMPIC , 1 MG/DOSE,) 4 MG/3ML SOPN       Morbid obesity Significant weight loss achieved with Ozempic , from 250 lbs to 204 lbs. Goal weight is 180 lbs. Weight loss has positively impacted overall health, including diabetes and thyroid  function. Current strategy is effective, and there is no immediate need to increase Ozempic  dose unless weight loss plateaus. - Continue current weight management strategies.  Type 2 diabetes mellitus A1c has improved significantly, now in the prediabetic range. Ozempic  has been effective in weight loss and  glycemic control. Current dose of 1 mg weekly is well-tolerated and effective. Consideration of increasing the dose to 2 mg if weight loss plateaus, but currently maintaining the dose as it is effective and well-tolerated. - Continue Ozempic  1 mg weekly. - Monitor A1c levels regularly.  Hypothyroidism TSH levels indicate over-treatment with current levothyroxine  dose, likely due to weight loss. Adjustment of medication is necessary to avoid hyperthyroid symptoms. A lab slip for TSH testing in [place]  will be provided to ensure follow-up. - Change levothyroxine  dose to 112 mcg daily. - Recheck TSH in two months. Provide lab slip for testing in [place]  if needed.  Hypertension Blood pressure is well-controlled. No signs of orthostatic hypotension or other blood pressure-related issues.  Hyperlipidemia Cholesterol levels are well-controlled with current medication regimen.       Return in about 6 months (around 12/05/2023) for With new PCP in [facility].       Aden Agreste, MD  [facility] [phone] (phone) [phone] (fax)  [facility]

## 2023-07-09 ENCOUNTER — Ambulatory Visit: Payer: Self-pay | Admitting: Family Medicine

## 2023-08-06 ENCOUNTER — Other Ambulatory Visit: Payer: Self-pay | Admitting: Family Medicine

## 2023-08-08 NOTE — Telephone Encounter (Signed)
 Change in pharmacy- patient moving- remainder of Rx forwarded Requested Prescriptions  Pending Prescriptions Disp Refills   OZEMPIC , 1 MG/DOSE, 4 MG/3ML SOPN [Pharmacy Med Name: OZEMPIC  1 MG/DOSE (4 MG/3 ML)] 3 mL 5    Sig: DIAL AND INJECT UNDER THE SKIN 1 MG WEEKLY     Endocrinology:  Diabetes - GLP-1 Receptor Agonists - semaglutide  Failed - 08/08/2023 12:30 PM      Failed - HBA1C in normal range and within 180 days    Hgb A1c MFr Bld  Date Value Ref Range Status  06/01/2023 5.8 (H) 4.8 - 5.6 % Final    Comment:             Prediabetes: 5.7 - 6.4          Diabetes: >6.4          Glycemic control for adults with diabetes: <7.0          Passed - Cr in normal range and within 360 days    Creat  Date Value Ref Range Status  10/10/2016 0.67 0.50 - 0.99 mg/dL Final    Comment:    For patients >42 years of age, the reference limit for Creatinine is approximately 13% higher for people identified as African-American. .    Creatinine, Ser  Date Value Ref Range Status  01/15/2023 0.78 0.57 - 1.00 mg/dL Final         Passed - Valid encounter within last 6 months    Recent Outpatient Visits           2 months ago Hypertension associated with diabetes [facility])   [place] [facility] [place], Jon HERO, MD

## 2023-09-07 ENCOUNTER — Other Ambulatory Visit: Payer: Self-pay | Admitting: Obstetrics

## 2023-09-07 DIAGNOSIS — R351 Nocturia: Secondary | ICD-10-CM

## 2023-09-07 DIAGNOSIS — N3946 Mixed incontinence: Secondary | ICD-10-CM

## 2023-10-29 ENCOUNTER — Encounter: Payer: Self-pay | Admitting: Family Medicine

## 2023-10-29 DIAGNOSIS — Z789 Other specified health status: Secondary | ICD-10-CM

## 2023-11-23 ENCOUNTER — Other Ambulatory Visit: Payer: Self-pay | Admitting: Family Medicine

## 2023-11-23 DIAGNOSIS — E559 Vitamin D deficiency, unspecified: Secondary | ICD-10-CM

## 2023-11-25 ENCOUNTER — Other Ambulatory Visit: Payer: Self-pay | Admitting: Family Medicine

## 2023-11-26 NOTE — Telephone Encounter (Signed)
 Requested medications are due for refill today.  yes  Requested medications are on the active medications list.  yes  Last refill. 12/08/2022 #13 3 rf  Future visit scheduled.   no  Notes to clinic.  Provider to review at this dosage. Pharmacy is in [place] .    Requested Prescriptions  Pending Prescriptions Disp Refills   Vitamin D , Ergocalciferol , (DRISDOL ) 1.25 MG (50000 UNIT) CAPS capsule [Pharmacy Med Name: VIT D2 (ERGOCAL) 1.25MG (50,000U) CP] 12 capsule     Sig: TAKE 1 CAPSULE BY MOUTH EVERY 7 DAYS     Endocrinology:  Vitamins - Vitamin D  Supplementation 2 Failed - 11/26/2023  2:02 PM      Failed - Manual Review: Route requests for 50,000 IU strength to the provider      Passed - Ca in normal range and within 360 days    Calcium   Date Value Ref Range Status  01/15/2023 9.2 8.7 - 10.3 mg/dL Final         Passed - Vitamin D  in normal range and within 360 days    Vit D, 25-Hydroxy  Date Value Ref Range Status  06/01/2023 72.2 30.0 - 100.0 ng/mL Final    Comment:    Vitamin D  deficiency has been defined by the Institute of Medicine and an Endocrine Society practice guideline as a level of serum 25-OH vitamin D  less than 20 ng/mL (1,2). The Endocrine Society went on to further define vitamin D  insufficiency as a level between 21 and 29 ng/mL (2). 1. IOM (Institute of Medicine). 2010. Dietary reference    intakes for calcium  and D. [place]: The    [employer]. 2. Holick MF, Binkley Streator, Bischoff-Ferrari HA, et al.    Evaluation, treatment, and prevention of vitamin D     deficiency: an Endocrine Society clinical practice    guideline. JCEM. 2011 Jul; 96(7):1911-30.          Passed - Valid encounter within last 12 months    Recent Outpatient Visits           5 months ago Hypertension associated with diabetes [facility])    [facility], Jon HERO, MD

## 2023-11-27 ENCOUNTER — Encounter: Payer: Medicare PPO | Admitting: Dermatology

## 2023-11-27 NOTE — Telephone Encounter (Signed)
 Requested Prescriptions  Pending Prescriptions Disp Refills   levothyroxine  (SYNTHROID ) 112 MCG tablet [Pharmacy Med Name: LEVOTHYROXINE  112 MCG TABLET] 90 tablet 1    Sig: TAKE 1 TABLET BY MOUTH DAILY     Endocrinology:  Hypothyroid Agents Failed - 11/27/2023 11:32 AM      Failed - TSH in normal range and within 360 days    TSH  Date Value Ref Range Status  06/01/2023 0.051 (L) 0.450 - 4.500 uIU/mL Final         Passed - Valid encounter within last 12 months    Recent Outpatient Visits           5 months ago Hypertension associated with diabetes [facility])   [facility] [place], Jon HERO, MD

## 2023-12-21 ENCOUNTER — Telehealth: Payer: Self-pay | Admitting: Family Medicine

## 2023-12-21 NOTE — Telephone Encounter (Signed)
 Copied from CRM [id number]. Topic: General - Other >> Dec 20, 2023  4:32 PM Erica Pineda wrote: Reason for CRM: Pt received a call regarding a medicare visit. Pt no longer is with  [place] as she has moved out of state!! Please update records. Pt is upset she continues to get calls.
